# Patient Record
Sex: Male | Born: 1975
Health system: Southern US, Community
[De-identification: ages and names within clinical notes are randomized; demographics above are authoritative.]

## PROBLEM LIST (undated history)

## (undated) DIAGNOSIS — E66811 Obesity, class 1: Secondary | ICD-10-CM

## (undated) DIAGNOSIS — J189 Pneumonia, unspecified organism: Secondary | ICD-10-CM

## (undated) DIAGNOSIS — M79602 Pain in left arm: Secondary | ICD-10-CM

## (undated) DIAGNOSIS — F419 Anxiety disorder, unspecified: Secondary | ICD-10-CM

## (undated) DIAGNOSIS — T4145XA Adverse effect of unspecified anesthetic, initial encounter: Secondary | ICD-10-CM

## (undated) DIAGNOSIS — M4722 Other spondylosis with radiculopathy, cervical region: Secondary | ICD-10-CM

## (undated) DIAGNOSIS — T8859XA Other complications of anesthesia, initial encounter: Secondary | ICD-10-CM

## (undated) DIAGNOSIS — E669 Obesity, unspecified: Secondary | ICD-10-CM

## (undated) DIAGNOSIS — I809 Phlebitis and thrombophlebitis of unspecified site: Secondary | ICD-10-CM

## (undated) DIAGNOSIS — Z9889 Other specified postprocedural states: Secondary | ICD-10-CM

## (undated) DIAGNOSIS — F4310 Post-traumatic stress disorder, unspecified: Secondary | ICD-10-CM

## (undated) DIAGNOSIS — Z8489 Family history of other specified conditions: Secondary | ICD-10-CM

## (undated) HISTORY — DX: Post-traumatic stress disorder, unspecified: F43.10

## (undated) HISTORY — DX: Anxiety disorder, unspecified: F41.9

## (undated) HISTORY — DX: Other specified postprocedural states: Z98.890

## (undated) HISTORY — DX: Pain in left arm: M79.602

## (undated) HISTORY — PX: NO PAST SURGERIES: SHX2092

---

## 1999-02-19 ENCOUNTER — Emergency Department (HOSPITAL_COMMUNITY): Admission: EM | Admit: 1999-02-19 | Discharge: 1999-02-19 | Payer: Self-pay | Admitting: Emergency Medicine

## 1999-02-19 ENCOUNTER — Encounter: Payer: Self-pay | Admitting: Emergency Medicine

## 2000-09-01 ENCOUNTER — Encounter: Payer: Self-pay | Admitting: Emergency Medicine

## 2000-09-01 ENCOUNTER — Emergency Department (HOSPITAL_COMMUNITY): Admission: EM | Admit: 2000-09-01 | Discharge: 2000-09-01 | Payer: Self-pay | Admitting: Emergency Medicine

## 2004-12-02 ENCOUNTER — Emergency Department (HOSPITAL_COMMUNITY): Admission: EM | Admit: 2004-12-02 | Discharge: 2004-12-02 | Payer: Self-pay | Admitting: Emergency Medicine

## 2017-01-22 ENCOUNTER — Emergency Department (HOSPITAL_COMMUNITY)
Admission: EM | Admit: 2017-01-22 | Discharge: 2017-01-22 | Disposition: A | Discharge status: Home / Self Care | Payer: Self-pay | Attending: Emergency Medicine | Admitting: Emergency Medicine

## 2017-01-22 ENCOUNTER — Encounter (HOSPITAL_COMMUNITY): Payer: Self-pay | Admitting: Emergency Medicine

## 2017-01-22 ENCOUNTER — Emergency Department (HOSPITAL_COMMUNITY): Payer: Self-pay

## 2017-01-22 DIAGNOSIS — Y999 Unspecified external cause status: Secondary | ICD-10-CM | POA: Insufficient documentation

## 2017-01-22 DIAGNOSIS — Z23 Encounter for immunization: Secondary | ICD-10-CM | POA: Insufficient documentation

## 2017-01-22 DIAGNOSIS — T07XXXA Unspecified multiple injuries, initial encounter: Secondary | ICD-10-CM

## 2017-01-22 DIAGNOSIS — Y929 Unspecified place or not applicable: Secondary | ICD-10-CM | POA: Insufficient documentation

## 2017-01-22 DIAGNOSIS — F10920 Alcohol use, unspecified with intoxication, uncomplicated: Secondary | ICD-10-CM

## 2017-01-22 DIAGNOSIS — S0990XA Unspecified injury of head, initial encounter: Secondary | ICD-10-CM | POA: Insufficient documentation

## 2017-01-22 DIAGNOSIS — S41112A Laceration without foreign body of left upper arm, initial encounter: Secondary | ICD-10-CM | POA: Insufficient documentation

## 2017-01-22 DIAGNOSIS — Y939 Activity, unspecified: Secondary | ICD-10-CM | POA: Insufficient documentation

## 2017-01-22 DIAGNOSIS — R791 Abnormal coagulation profile: Secondary | ICD-10-CM | POA: Insufficient documentation

## 2017-01-22 LAB — CBC WITH DIFFERENTIAL/PLATELET
BASOS PCT: 1 %
Basophils Absolute: 0.1 10*3/uL (ref 0.0–0.1)
Eosinophils Absolute: 0.1 10*3/uL (ref 0.0–0.7)
Eosinophils Relative: 1 %
HCT: 45.2 % (ref 39.0–52.0)
HEMOGLOBIN: 15 g/dL (ref 13.0–17.0)
LYMPHS PCT: 23 %
Lymphs Abs: 2.4 10*3/uL (ref 0.7–4.0)
MCH: 29.5 pg (ref 26.0–34.0)
MCHC: 33.2 g/dL (ref 30.0–36.0)
MCV: 88.8 fL (ref 78.0–100.0)
MONO ABS: 0.7 10*3/uL (ref 0.1–1.0)
MONOS PCT: 6 %
NEUTROS ABS: 7.4 10*3/uL (ref 1.7–7.7)
NEUTROS PCT: 69 %
Platelets: 326 10*3/uL (ref 150–400)
RBC: 5.09 MIL/uL (ref 4.22–5.81)
RDW: 13 % (ref 11.5–15.5)
WBC: 10.6 10*3/uL — ABNORMAL HIGH (ref 4.0–10.5)

## 2017-01-22 LAB — COMPREHENSIVE METABOLIC PANEL
ALBUMIN: 4.5 g/dL (ref 3.5–5.0)
ALK PHOS: 41 U/L (ref 38–126)
ALT: 25 U/L (ref 17–63)
ANION GAP: 9 (ref 5–15)
AST: 22 U/L (ref 15–41)
BILIRUBIN TOTAL: 0.5 mg/dL (ref 0.3–1.2)
BUN: 8 mg/dL (ref 6–20)
CALCIUM: 9.2 mg/dL (ref 8.9–10.3)
CO2: 28 mmol/L (ref 22–32)
Chloride: 104 mmol/L (ref 101–111)
Creatinine, Ser: 1.18 mg/dL (ref 0.61–1.24)
GFR calc Af Amer: >60 mL/min (ref >60)
GLUCOSE: 122 mg/dL — AB (ref 65–99)
POTASSIUM: 3.7 mmol/L (ref 3.5–5.1)
Sodium: 141 mmol/L (ref 135–145)
TOTAL PROTEIN: 8.3 g/dL — AB (ref 6.5–8.1)

## 2017-01-22 LAB — PROTIME-INR
INR: 1.05
Prothrombin Time: 13.7 seconds (ref 11.4–15.2)

## 2017-01-22 LAB — I-STAT TROPONIN, ED: TROPONIN I, POC: 0 ng/mL (ref 0.00–0.08)

## 2017-01-22 LAB — CBG MONITORING, ED: Glucose-Capillary: 104 mg/dL — ABNORMAL HIGH (ref 65–99)

## 2017-01-22 LAB — APTT: aPTT: 28 seconds (ref 24–36)

## 2017-01-22 LAB — ETHANOL: Alcohol, Ethyl (B): 203 mg/dL — ABNORMAL HIGH (ref <5)

## 2017-01-22 MED ORDER — FLUORESCEIN SODIUM 0.6 MG OP STRP
1.0000 | ORAL_STRIP | Freq: Once | OPHTHALMIC | Status: DC
Start: 2017-01-22 — End: 2017-01-22

## 2017-01-22 MED ORDER — TETANUS-DIPHTH-ACELL PERTUSSIS 5-2.5-18.5 LF-MCG/0.5 IM SUSP
0.5000 mL | Freq: Once | INTRAMUSCULAR | Status: AC
  Administered 2017-01-22: 0.5 mL via INTRAMUSCULAR
  Filled 2017-01-22: qty 0.5

## 2017-01-22 MED ORDER — LIDOCAINE-EPINEPHRINE (PF) 2 %-1:200000 IJ SOLN
20.0000 mL | Freq: Once | INTRAMUSCULAR | Status: DC
  Filled 2017-01-22: qty 20

## 2017-01-22 MED ORDER — SODIUM CHLORIDE 0.9 % IV BOLUS (SEPSIS)
1000.0000 mL | Freq: Once | INTRAVENOUS | Status: AC
Start: 2017-01-22 — End: 2017-01-22
  Administered 2017-01-22: 1000 mL via INTRAVENOUS

## 2017-01-22 MED ORDER — LIDOCAINE-EPINEPHRINE (PF) 2 %-1:200000 IJ SOLN
20.0000 mL | Freq: Once | INTRAMUSCULAR | Status: AC
  Administered 2017-01-22: 20 mL via INTRADERMAL
  Filled 2017-01-22: qty 20

## 2017-01-22 MED ORDER — TETRACAINE HCL 0.5 % OP SOLN: 2.0000 [drp] | Freq: Once | OPHTHALMIC | Status: DC
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
## 2017-01-22 NOTE — ED Notes (Signed)
Patient transported to X-ray 
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
## 2017-01-22 NOTE — Discharge Instructions (Signed)
You may alternate Tylenol 1000 mg every 6 hours as needed for pain and Ibuprofen 800 mg every 8 hours as needed for pain.  Please take Ibuprofen with food. ° ° ° °To find a primary care or specialty doctor please call [phone] or [phone] to access "[facility] Find a Doctor Service." ° °You may also go on the [facility] website at [email or web address] ° °There are also multiple [facility], [facility],  and [facility] practices throughout [place] that are frequently accepting new patients. You may find a clinic that is close to your home and contact them. ° °[facility] -  °[address] °[place] [address] °[phone] ° ° °[facility] -  °[address] ° [place] [address] °[phone] ° ° °[facility] - °[address]  °[place] [address] °[phone] ° ° °

## 2017-01-24 ENCOUNTER — Encounter (HOSPITAL_COMMUNITY): Payer: Self-pay | Admitting: Emergency Medicine

## 2017-01-24 ENCOUNTER — Emergency Department (HOSPITAL_COMMUNITY)
Admission: EM | Admit: 2017-01-24 | Discharge: 2017-01-24 | Disposition: A | Discharge status: Home / Self Care | Payer: Self-pay | Attending: Physician Assistant | Admitting: Physician Assistant

## 2017-01-24 DIAGNOSIS — R200 Anesthesia of skin: Secondary | ICD-10-CM | POA: Insufficient documentation

## 2017-01-24 DIAGNOSIS — F17200 Nicotine dependence, unspecified, uncomplicated: Secondary | ICD-10-CM | POA: Insufficient documentation

## 2017-01-24 MED ORDER — HYDROCODONE-ACETAMINOPHEN 5-325 MG PO TABS
1.0000 | ORAL_TABLET | Freq: Once | ORAL | Status: AC
  Administered 2017-01-24: 1 via ORAL
  Filled 2017-01-24: qty 1

## 2017-01-24 NOTE — ED Notes (Signed)
Dressing applied to left hand 
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
## 2017-01-24 NOTE — Discharge Instructions (Signed)
Call the hand surgeon listed to schedule follow up if you decide to purse nerve repair.  Return to [facility] or ER on 4/30 for suture removal. Continue keeping wounds clean and dry.  Return earlier if wounds look like they are getting infected (red, draining pus, all the sudden pain gets way worse) or if you have any additional concerns.
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
## 2017-01-29 ENCOUNTER — Encounter (HOSPITAL_COMMUNITY): Payer: Self-pay

## 2017-01-29 ENCOUNTER — Emergency Department (HOSPITAL_COMMUNITY)
Admission: EM | Admit: 2017-01-29 | Discharge: 2017-01-29 | Disposition: A | Discharge status: Home / Self Care | Payer: Self-pay | Attending: Emergency Medicine | Admitting: Emergency Medicine

## 2017-01-29 DIAGNOSIS — F17200 Nicotine dependence, unspecified, uncomplicated: Secondary | ICD-10-CM | POA: Insufficient documentation

## 2017-01-29 DIAGNOSIS — L03012 Cellulitis of left finger: Secondary | ICD-10-CM | POA: Insufficient documentation

## 2017-01-29 DIAGNOSIS — Z4802 Encounter for removal of sutures: Secondary | ICD-10-CM | POA: Insufficient documentation

## 2017-01-29 MED ORDER — CEPHALEXIN 500 MG PO CAPS: 500.0000 mg | ORAL_CAPSULE | Freq: Four times a day (QID) | ORAL | 0 refills | Status: DC

## 2017-01-29 NOTE — Discharge Instructions (Signed)
The remaining sutures in your finger may be removed in 2 days.  Please take antibiotic as directed.  Follow-up with hand surgery as discussed (Dr. Melvyn Novas is the designated provider for this visit).

## 2017-01-29 NOTE — ED Notes (Signed)
Removed sutures from left FA and left palm. Removed every other suture from left pinky.
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
## 2017-01-29 NOTE — ED Triage Notes (Signed)
Per Pt, Pt is coming from home and need of suture removal.

## 2017-01-29 NOTE — ED Notes (Signed)
Suture removal kit at bedside 

## 2017-02-06 ENCOUNTER — Emergency Department (HOSPITAL_COMMUNITY)
Admission: EM | Admit: 2017-02-06 | Discharge: 2017-02-06 | Disposition: A | Discharge status: Home / Self Care | Payer: Self-pay | Attending: Emergency Medicine | Admitting: Emergency Medicine

## 2017-02-06 ENCOUNTER — Encounter (HOSPITAL_COMMUNITY): Payer: Self-pay | Admitting: *Deleted

## 2017-02-06 DIAGNOSIS — H1031 Unspecified acute conjunctivitis, right eye: Secondary | ICD-10-CM | POA: Insufficient documentation

## 2017-02-06 DIAGNOSIS — F17290 Nicotine dependence, other tobacco product, uncomplicated: Secondary | ICD-10-CM | POA: Insufficient documentation

## 2017-02-06 MED ORDER — TETRACAINE HCL 0.5 % OP SOLN
1.0000 [drp] | Freq: Once | OPHTHALMIC | Status: AC
  Administered 2017-02-06: 1 [drp] via OPHTHALMIC
  Filled 2017-02-06: qty 2

## 2017-02-06 MED ORDER — KETOROLAC TROMETHAMINE 0.5 % OP SOLN: 1.0000 [drp] | Freq: Four times a day (QID) | OPHTHALMIC | 0 refills | Status: DC

## 2017-02-06 MED ORDER — CIPROFLOXACIN HCL 0.3 % OP SOLN: 1.0000 [drp] | OPHTHALMIC | 0 refills | Status: DC

## 2017-02-06 MED ORDER — FLUORESCEIN SODIUM 0.6 MG OP STRP
1.0000 | ORAL_STRIP | Freq: Once | OPHTHALMIC | Status: AC
  Administered 2017-02-06: 1 via OPHTHALMIC
  Filled 2017-02-06: qty 1

## 2017-02-06 NOTE — Discharge Instructions (Signed)
Take the prescribed medication as directed.  Do not wear contacts until your eye heals.  When ready to wear contacts again, open new pair.  Throw old pair out. Follow-up with Dr. Sherryll BurgerShah if symptoms worsening or not improving in the next 48 hours. Return to the ED for new concerns.

## 2017-02-06 NOTE — ED Notes (Signed)
Pt requested [place]. Made pt aware that EDP/PA needs to see him first and then we'll go from there.

## 2017-02-06 NOTE — ED Notes (Signed)
Pt did not need anything at this time  

## 2017-02-06 NOTE — ED Notes (Signed)
[place]s lamp placed in room 

## 2017-02-06 NOTE — ED Provider Notes (Signed)
[facility] Provider Note   CSN: [id number] Arrival date & time: 02/06/17  [id number]     History   Chief Complaint Chief Complaint  Patient presents with  . Foreign Body in Eye    HPI Vincent Davis is a 41 y.o. male.  The history is provided by the patient and medical records.    41 year old male here with right eye redness and irritation.  States he awoke with the symptoms on Sunday morning after he accidentally slept in his contacts Saturday night. States his contact was still in his eye and he was able to remove it, but continued to feel that he has something stuck in his eye.  States his vision feels overall normal. States he has been using over-the-counter pinkeye drops without any change in symptoms. States he has not worn his contacts since Sunday morning.  History reviewed. No pertinent past medical history.  There are no active problems to display for this patient.   History reviewed. No pertinent surgical history.     Home Medications    Prior to Admission medications   Medication Sig Start Date End Date Taking? Authorizing Provider  cephALEXin (KEFLEX) 500 MG capsule Take 1 capsule (500 mg total) by mouth 4 (four) times daily. 01/29/17   Felicie MornSmith, David, NP    Family History No family history on file.  Social History Social History  Substance Use Topics  . Smoking status: Light Tobacco Smoker  . Smokeless tobacco: Never Used  . Alcohol use Yes     Allergies   Patient has no known allergies.   Review of Systems Review of Systems  Eyes: Positive for pain and redness.  All other systems reviewed and are negative.    Physical Exam Updated Vital Signs BP (!) 132/95 (BP Location: Left Arm)   Pulse 67   Temp 98.4 F (36.9 C) (Oral)   Resp 18   Ht 5\' 11"  (1.803 m)   Wt 104.3 kg   SpO2 93%   BMI 32.08 kg/m   Physical Exam  Constitutional: He is oriented to person, place, and time. He appears well-developed and well-nourished.  HENT:    Head: Normocephalic and atraumatic.  Mouth/Throat: Oropharynx is clear and moist.  Eyes: EOM are normal. Pupils are equal, round, and reactive to light. Right conjunctiva is injected.  No significant lid edema or erythema, right conjunctiva is injected and eyes tearing, there is no purulent drainage, EOMs fully intact, no nystagmus, pupils symmetric and reactive bilaterally, fluorescein stain without evidence or corneal ulcer or abrasion, no uptake; no foreign body noted  Neck: Normal range of motion.  Cardiovascular: Normal rate, regular rhythm and normal heart sounds.   Pulmonary/Chest: Effort normal and breath sounds normal.  Abdominal: Soft. Bowel sounds are normal.  Musculoskeletal: Normal range of motion.  Neurological: He is alert and oriented to person, place, and time.  Skin: Skin is warm and dry.  Psychiatric: He has a normal mood and affect.  Nursing note and vitals reviewed.    ED Treatments / Results  Labs (all labs ordered are listed, but only abnormal results are displayed) Labs Reviewed - No data to display  EKG  EKG Interpretation None       Radiology No results found.  Procedures Procedures (including critical care time)  Medications Ordered in ED Medications  tetracaine (PONTOCAINE) 0.5 % ophthalmic solution 1 drop (not administered)  fluorescein ophthalmic strip 1 strip (not administered)     Initial Impression / Assessment and Plan / ED  Course  I have reviewed the triage vital signs and the nursing notes.  Pertinent labs & imaging results that were available during my care of the patient were reviewed by me and considered in my medical decision making (see chart for details).  41 year old male here with right eye redness and sensation of foreign body after sleeping in his contacts over the weekend. He has no diffuse lid edema or erythema suggestive of orbital or preseptal cellulitis. His right conjunctiva is injected and eyes tearing. His EOMs are  fully intact.  Fluorescein stain performed, no evidence of corneal ulcer or abrasion. No uptake. No evidence of retained foreign body. Given patient wears contacts, will start Ciloxan drops for coverage of Pseudomonas. He is instructed not to wear his contacts until his eye heals, and then he should open a new pair and throw out old pair. He does not have a eye doctor here locally, so was given information for ophthalmologist on call.  Discussed plan with patient, he acknowledged understanding and agreed with plan of care.  Return precautions given for new or worsening symptoms.  Final Clinical Impressions(s) / ED Diagnoses   Final diagnoses:  Acute conjunctivitis of right eye, unspecified acute conjunctivitis type    New Prescriptions Discharge Medication List as of 02/06/2017  7:55 AM    START taking these medications   Details  ciprofloxacin (CILOXAN) 0.3 % ophthalmic solution Place 1 drop into the right eye every 2 (two) hours. Administer 1 drop, every 2 hours, while awake, for 2 days. Then 1 drop, every 4 hours, while awake, for the next 5 days., Starting Tue 02/06/2017, Print    ketorolac (ACULAR) 0.5 % ophthalmic solution Place 1 drop into both eyes every 6 (six) hours., Starting Tue 02/06/2017, Print         Garlon Hatchet, PA-C 02/06/17 1610    Glynn Octave, MD 02/06/17 [phone]

## 2017-02-06 NOTE — ED Triage Notes (Signed)
c/o pain in right eye onset Sunday states he got some otc eye medication however it isn't working. Right eye swollen denies injury

## 2017-02-09 MED FILL — CIPROFLOXACIN 0.3% EYE DROP: 0.3 | 10 days supply | Qty: 5 | Fill #0

## 2017-02-09 MED FILL — KETOROLAC 0.5% OPHTH SOLN: 0.5 | 11 days supply | Qty: 3 | Fill #0

## 2017-04-21 ENCOUNTER — Encounter (HOSPITAL_COMMUNITY): Payer: Self-pay | Admitting: Emergency Medicine

## 2017-04-21 ENCOUNTER — Emergency Department (HOSPITAL_COMMUNITY)
Admission: EM | Admit: 2017-04-21 | Discharge: 2017-04-21 | Disposition: A | Discharge status: Home / Self Care | Payer: Self-pay | Attending: Emergency Medicine | Admitting: Emergency Medicine

## 2017-04-21 DIAGNOSIS — G8929 Other chronic pain: Secondary | ICD-10-CM | POA: Insufficient documentation

## 2017-04-21 DIAGNOSIS — F17210 Nicotine dependence, cigarettes, uncomplicated: Secondary | ICD-10-CM | POA: Insufficient documentation

## 2017-04-21 DIAGNOSIS — M25512 Pain in left shoulder: Secondary | ICD-10-CM | POA: Insufficient documentation

## 2017-04-21 DIAGNOSIS — M79602 Pain in left arm: Secondary | ICD-10-CM | POA: Insufficient documentation

## 2017-04-21 MED ORDER — ACETAMINOPHEN 500 MG PO TABS: 500.0000 mg | ORAL_TABLET | Freq: Four times a day (QID) | ORAL | 0 refills | Status: DC | PRN

## 2017-04-21 MED ORDER — METHOCARBAMOL 500 MG PO TABS: 500.0000 mg | ORAL_TABLET | Freq: Two times a day (BID) | ORAL | 0 refills | Status: DC

## 2017-04-21 MED ORDER — NAPROXEN 500 MG PO TABS
500.0000 mg | ORAL_TABLET | Freq: Once | ORAL | Status: AC
  Administered 2017-04-21: 500 mg via ORAL
  Filled 2017-04-21: qty 1

## 2017-04-21 MED ORDER — NAPROXEN 500 MG PO TABS: 500.0000 mg | ORAL_TABLET | Freq: Two times a day (BID) | ORAL | 0 refills | Status: DC

## 2017-04-21 NOTE — Discharge Instructions (Addendum)
Medications: Robaxin, Naprosyn, Tylenol  Treatment: Take Robaxin twice daily as prescribed. Do not drive or operate machinery when taking this medication. Take Naprosyn twice daily as prescribed. You can take Tylenol throughout the day every 6 hours as well. Use a heating pad to shoulder 3-4 times daily alternating 20 minutes on, 20 minutes off. Attempt the stretches we discussed daily after heating. You may find a massage to be helpful for you.  Follow-up: Please follow-up with the orthopedic doctor if your symptoms are not improving over the next week. Please return to the emergency department if you develop any new or worsening symptoms.

## 2017-04-21 NOTE — ED Provider Notes (Signed)
[facility]-EMERGENCY DEPT Provider Note   CSN: [id number] Arrival date & time: 04/21/17  [id number]   By signing my name below, I, Vincent Davis, attest that this documentation has been prepared under the direction and in the presence of Vincent BayleyAlex Zyair Rhein PA-C. Electronically Signed: Thelma BargeNick Davis, Scribe. 04/21/17. 10:30 AM.  History   Chief Complaint Chief Complaint  Patient presents with  . Arm Pain   [name]    HPI Comments: Vincent Davis is a 41 y.o. male who presents to the Emergency Department complaining of waxing/waning, gradually worsening left-sided arm pain that began 3 months ago. He has associated neck pain and numbness/tingling to the area. He was seen in the ED previously for this where he received a laceration repair and the pain has occurred intermittently since. He describes this as "inside my arm there is pain that shoots from my shoulder to my forearm". The pain is occasionally worse when he moves his arm. He has tried massage therapy with mild to no relief. He denies fever, CP, and SOB. He denies any new mechanism of injury or trauma to the area. Pt has not seen an orthopedist for his injury. Pt has no pertinent medical history. 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 Pt is R hand dominant.  History reviewed. No pertinent past medical history.  There are no active problems to display for this patient.   History reviewed. No pertinent surgical history.     Home Medications    Prior to Admission medications   Medication Sig Start Date End Date Taking? Authorizing Provider  acetaminophen (TYLENOL) 500 MG tablet Take 1 tablet (500 mg total) by mouth every 6 (six) hours as needed. 04/21/17   Vincent Davis, Vincent BogaAlexandra M, PA-C  cephALEXin (KEFLEX) 500 MG capsule Take 1 capsule (500 mg total) by mouth 4 (four) times daily. 01/29/17   Vincent Davis, David, NP  ciprofloxacin (CILOXAN) 0.3 % ophthalmic  solution Place 1 drop into the right eye every 2 (two) hours. Administer 1 drop, every 2 hours, while awake, for 2 days. Then 1 drop, every 4 hours, while awake, for the next 5 days. 02/06/17   Vincent Davis, Vincent M, PA-C  ketorolac (ACULAR) 0.5 % ophthalmic solution Place 1 drop into both eyes every 6 (six) hours. 02/06/17   Vincent Davis, Vincent M, PA-C  methocarbamol (ROBAXIN) 500 MG tablet Take 1 tablet (500 mg total) by mouth 2 (two) times daily. 04/21/17   Vincent Davis, Vincent BogaAlexandra M, PA-C  naproxen (NAPROSYN) 500 MG tablet Take 1 tablet (500 mg total) by mouth 2 (two) times daily. 04/21/17   Vincent Davis, Vincent Davis M, PA-C    Family History No family history on file.  Social History Social History  Substance Use Topics  . Smoking status: Light Tobacco Smoker  . Smokeless tobacco: Never Used  . Alcohol use Yes     Allergies   Patient has no known allergies.   Review of Systems Review of Systems  Constitutional: Negative for fever.  Respiratory: Negative for shortness of breath.   Cardiovascular: Negative for chest pain.  Musculoskeletal: Positive for myalgias and neck pain.  Neurological: Positive for numbness.     Physical Exam Updated Vital Signs BP (!) 164/77 (BP Location: Right Arm)   Pulse 77   Temp 98.9 F (37.2 C) (Oral)   Resp 16   Ht 5\' 11"  (1.803 Davis)   Wt 104.3 kg (230 lb)  SpO2 100%   BMI 32.08 kg/Davis   Physical Exam  Constitutional: He appears well-developed and well-nourished. No distress.  HENT:  Head: Normocephalic and atraumatic.  Mouth/Throat: Oropharynx is clear and moist. No oropharyngeal exudate.  Eyes: Pupils are equal, round, and reactive to light. Conjunctivae are normal. Right eye exhibits no discharge. Left eye exhibits no discharge. No scleral icterus.  Neck: Normal range of motion. Neck supple. Muscular tenderness present. No spinous process tenderness present. Normal range of motion present. No thyromegaly present.    Cardiovascular: Normal rate, regular rhythm, normal  heart sounds and intact distal pulses.  Exam reveals no gallop and no friction rub.   No murmur heard. Pulmonary/Chest: Effort normal and breath sounds normal. No stridor. No respiratory distress. He has no wheezes. He has no rales.  Abdominal: Soft. Bowel sounds are normal. He exhibits no distension. There is no tenderness. There is no rebound and no guarding.  Musculoskeletal: He exhibits no edema.  Left upper trapezius tenderness and spasm on palpation with tenderness to lateral triceps to lateral proximal forearm Normal sensation throughout upper extremity except over scar of previous sutures on left 5th digit Full ROM, equal bilateral grip strength 5/5 strength with elbow flexion and extension but pain with these emotions Radial pulses intact Full ROM of all digits  Lymphadenopathy:    He has no cervical adenopathy.  Neurological: He is alert. Coordination normal.  Skin: Skin is warm and dry. No rash noted. He is not diaphoretic. No pallor.  Psychiatric: He has a normal mood and affect.  Nursing note and vitals reviewed.    ED Treatments / Results  DIAGNOSTIC STUDIES: Oxygen Saturation is 100% on RA3, normal by my interpretation.    COORDINATION OF CARE: 10:29 AM Discussed treatment plan with pt at bedside and pt agreed to plan.  Labs (all labs ordered are listed, but only abnormal results are displayed) Labs Reviewed - No data to display  EKG  EKG Interpretation None       Radiology No results found.  Procedures Procedures (including critical care time)  Medications Ordered in ED Medications  naproxen (NAPROSYN) tablet 500 mg (500 mg Oral Given 04/21/17 1048)     Initial Impression / Assessment and Plan / ED Course  I have reviewed the triage vital signs and the nursing notes.  Pertinent labs & imaging results that were available during my care of the patient were reviewed by me and considered in my medical decision making (see chart for details).       Pt presents to the ER with neck pain that is radiating down arm. There has been no recent trauma and imaging is not indicated at this time. Patient has no chest pain or shortness of breath and pain is very consistent with radicular pain versus ACS. We'll discharge patient home with supportive treatment including Robaxin, Naprosyn, Tylenol. No evidence of cervical nerve root compression on physical exam. DC w conservative home therapies (ie heat) and stretching. I also advised further massage and stress relieving activities. Advised f-u with orthopedics if s/s persist. Return precautions discussed. Patient understands and agrees with plan. Patient vitals stable throughout ED course and discharged in satisfactory condition.   Final Clinical Impressions(s) / ED Diagnoses   Final diagnoses:  Left arm pain  Acute pain of left shoulder    New Prescriptions New Prescriptions   ACETAMINOPHEN (TYLENOL) 500 MG TABLET    Take 1 tablet (500 mg total) by mouth every 6 (six) hours as needed.  METHOCARBAMOL (ROBAXIN) 500 MG TABLET    Take 1 tablet (500 mg total) by mouth 2 (two) times daily.   NAPROXEN (NAPROSYN) 500 MG TABLET    Take 1 tablet (500 mg total) by mouth 2 (two) times daily.  I personally performed the services described in this documentation, which was scribed in my presence. The recorded information has been reviewed and is accurate.    Vincent Holes, PA-C 04/21/17 1054    Alvira Monday, MD 04/21/17 2306

## 2017-04-21 NOTE — ED Notes (Addendum)
Pt from home with c/o left arm pain that initially began when he had stitches placed in May. Pt states he has had shooting pain since then. Pt states the pain is intermittent but got increasingly worse and woke him up last night. Pt rates his pain 7/10. Pt has full ROM

## 2017-04-21 NOTE — ED Notes (Signed)
Bed: WTR5 Expected date:  Expected time:  Means of arrival:  Comments: 

## 2017-04-21 NOTE — ED Notes (Signed)
PA at bedside.

## 2017-04-25 ENCOUNTER — Encounter (HOSPITAL_COMMUNITY): Payer: Self-pay | Admitting: Emergency Medicine

## 2017-04-25 ENCOUNTER — Emergency Department (HOSPITAL_COMMUNITY)
Admission: EM | Admit: 2017-04-25 | Discharge: 2017-04-25 | Disposition: A | Discharge status: Home / Self Care | Payer: Self-pay | Attending: Emergency Medicine | Admitting: Emergency Medicine

## 2017-04-25 DIAGNOSIS — M79602 Pain in left arm: Secondary | ICD-10-CM | POA: Insufficient documentation

## 2017-04-25 DIAGNOSIS — F17210 Nicotine dependence, cigarettes, uncomplicated: Secondary | ICD-10-CM | POA: Insufficient documentation

## 2017-04-25 DIAGNOSIS — Z79899 Other long term (current) drug therapy: Secondary | ICD-10-CM | POA: Insufficient documentation

## 2017-04-25 DIAGNOSIS — M25512 Pain in left shoulder: Secondary | ICD-10-CM | POA: Insufficient documentation

## 2017-04-25 MED ORDER — METHOCARBAMOL 500 MG PO TABS
500.0000 mg | ORAL_TABLET | Freq: Once | ORAL | Status: AC
  Administered 2017-04-25: 500 mg via ORAL
  Filled 2017-04-25: qty 1

## 2017-04-25 MED ORDER — CYCLOBENZAPRINE HCL 10 MG PO TABS: 10.0000 mg | ORAL_TABLET | Freq: Two times a day (BID) | ORAL | 0 refills | Status: DC | PRN

## 2017-04-25 NOTE — Care Management (Signed)
ED CM received [facility] secretary concerning patient needing assistance with follow up, patient is uninsured [facility] appt scheduled for 8/1 at 10am information placed on AVS [facility]. No further ED CM needs identified
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
## 2017-04-25 NOTE — Discharge Instructions (Signed)
Continue taking tylenol and naprosyn for pain.   Continue to apply heat to the affected area.   Take Flexeril as prescribed. This medication will make you drowsy so do not drive or drink alcohol when taking it. As we discussed, DO NOT TAKE the flexeril and the robaxin together.   Follow-up with the referred orthopedic doctor for further evaluation.   Follow-up with the [facility] listed on [facility] paperwork. They have arranged an appointment for you.  Return ot the Emergency Department for any worsening or concerning symptoms.

## 2017-04-25 NOTE — ED Provider Notes (Signed)
[facility] Provider [facility]   CSN: [id number] Arrival date & time: 04/25/17  1121     History   Chief Complaint Chief Complaint  Patient presents with  . arm numbness    left   . Shoulder Pain    left     HPI Vincent Davis is a 41 y.o. male who presents with continued left arm pain and numbness/tingling. Patient states that pain originated from a previous injury where he had a laceration repair. Patient reports a "pain inside my arm that goes down the whole arm."  He intermittently experiences tingling to the left hand. He reports that movement of the LUE exacerbates his pain. Patient was seen at [facility] ED on 04/21/17 for evaluation of same symptoms. At that time he was discharged with Robaxin, Naprosyn and Tylenol and instructed on conservative therapies, such warm compresses, massage. He was referred to orthopedics for fur evaluation. Patient comes to the ED today because he states that he has not been able to follow-up with the referred orthopedic doctor. He also is requesting refills on his medication and additional work note. Additionally, he states that he needs to talk to case management regarding his case. Patient denies any new or changes in symptoms. He denies any new trauma, fall, or injury. Patient denies any neck pain, CP, SOB.    The history is provided by the patient.    History reviewed. No pertinent past medical history.  There are no active problems to display for this patient.   History reviewed. No pertinent surgical history.     Home Medications    Prior to Admission medications   Medication Sig Start Date End Date Taking? Authorizing Provider  acetaminophen (TYLENOL) 500 MG tablet Take 1 tablet (500 mg total) by mouth every 6 (six) hours as needed. 04/21/17   Law, Waylan Boga, PA-C  cephALEXin (KEFLEX) 500 MG capsule Take 1 capsule (500 mg total) by mouth 4 (four) times daily. 01/29/17   Felicie Morn, NP  ciprofloxacin (CILOXAN) 0.3 %  ophthalmic solution Place 1 drop into the right eye every 2 (two) hours. Administer 1 drop, every 2 hours, while awake, for 2 days. Then 1 drop, every 4 hours, while awake, for the next 5 days. 02/06/17   Garlon Hatchet, PA-C  cyclobenzaprine (FLEXERIL) 10 MG tablet Take 1 tablet (10 mg total) by mouth 2 (two) times daily as needed for muscle spasms. 04/25/17   Maxwell Caul, PA-C  ketorolac (ACULAR) 0.5 % ophthalmic solution Place 1 drop into both eyes every 6 (six) hours. 02/06/17   Garlon Hatchet, PA-C  methocarbamol (ROBAXIN) 500 MG tablet Take 1 tablet (500 mg total) by mouth 2 (two) times daily. 04/21/17   Law, Waylan Boga, PA-C  naproxen (NAPROSYN) 500 MG tablet Take 1 tablet (500 mg total) by mouth 2 (two) times daily. 04/21/17   Emi Holes, PA-C    Family History History reviewed. No pertinent family history.  Social History Social History  Substance Use Topics  . Smoking status: Light Tobacco Smoker  . Smokeless tobacco: Never Used  . Alcohol use Yes     Allergies   Patient has no known allergies.   Review of Systems Review of Systems  Respiratory: Negative for shortness of breath.   Cardiovascular: Negative for chest pain.  Musculoskeletal: Negative for neck pain.       LUE pain  Neurological: Positive for numbness. Negative for weakness.     Physical Exam Updated Vital Signs  BP (!) 142/88 (BP Location: Right Arm)   Pulse 77   Temp 98.8 F (37.1 C) (Oral)   Resp 17   Ht 5\' 11"  (1.803 m)   Wt 104.3 kg (230 lb)   SpO2 98%   BMI 32.08 kg/m   Physical Exam  Constitutional: He is oriented to person, place, and time. He appears well-developed and well-nourished.  Sitting comfortably on examination table  HENT:  Head: Normocephalic and atraumatic.  Eyes: Conjunctivae and EOM are normal. Right eye exhibits no discharge. Left eye exhibits no discharge. No scleral icterus.  Neck: Full passive range of motion without pain.  Full flexion/extension and lateral  movement of neck fully intact. No bony midline tenderness. No deformities or crepitus. Positive Spurlings.  Cardiovascular:  Pulses:      Radial pulses are 2+ on the right side, and 2+ on the left side.  Pulmonary/Chest: Effort normal.  Musculoskeletal:  Limited ROM of left shoulder secondary to patietn's pain. No deformity or crepitus noted. No overlying warmth, erythema, edema, ecchymosis. FROM of RUE.   Neurological: He is alert and oriented to person, place, and time. GCS eye subscore is 4. GCS verbal subscore is 5. GCS motor subscore is 6.  Follows commands, Moves all extremities  5/5 strength to BUE and BLE  Sensation intact throughout   Skin: Skin is warm and dry.  Psychiatric: He has a normal mood and affect. His speech is normal and behavior is normal.  Nursing note and vitals reviewed.    ED Treatments / Results  Labs (all labs ordered are listed, but only abnormal results are displayed) Labs Reviewed - No data to display  EKG  EKG Interpretation None       Radiology No results found.  Procedures Procedures (including critical care time)  Medications Ordered in ED Medications  methocarbamol (ROBAXIN) tablet 500 mg (500 mg Oral Given 04/25/17 1344)     Initial Impression / Assessment and Plan / ED Course  I have reviewed the triage vital signs and the nursing notes.  Pertinent labs & imaging results that were available during my care of the patient were reviewed by me and considered in my medical decision making (see chart for details).     41 y.o. M who presents with persistent LUE pain. Seen here on 04/21/17 for same symptoms. Patient is afebrile, non-toxic appearing, sitting comfortably on examination table. Patient is neurovascularly intact. Physical exam is not concerning for septic arthritis. Consider exacerbation of chronic pain vs muscle strain vs radiculopathy.  Patient requesting to speak to case management regarding his case. Patient requesting more  pain medication since the medicine he was prescribed only temporarily relieves his pain. Explained that he needs to continue trying conservative medications and that narcotics are not indicated in this instance. Patient requesting that I write him a work note until he can be seen by orthopedics. He does not have a a scheduled appointment yet. Given that patient has no new trauma or injury and no changes in symptoms, no further workup required at this time. Will plan to refer to case management.   Paged case management. Updated patient on plan. He is very upset that case management was not immediately paged when he came to the ED. He reports "I told them out front when I first came in that I needed to see case management so why weren't they called then."  Patient is very upset that he has to wait for them to "maybe call back." Apologized to  patient and rxplained to patient that he needs to be seen by a provider before case management is consulted and that we are following protocol. Patient asking for pain medication, state he hasn't taken anything since last night. Plan to give analgesics in the department.   Case management has arranged for an outpatient primary care appointment for 05/02/17. Discussed with patient and instructed him to follow-up as directed on the discharge paperwork. Plan to provide patient with a work note for the next few days. Instructed patient to continue with conservative therapy measures. Will continue muscle relaxer. Return precautions discussed. Patient expresses understanding and agreement to plan.    Final Clinical Impressions(s) / ED Diagnoses   Final diagnoses:  Left arm pain  Acute pain of left shoulder    New Prescriptions Discharge Medication List as of 04/25/2017  2:35 PM    START taking these medications   Details  cyclobenzaprine (FLEXERIL) 10 MG tablet Take 1 tablet (10 mg total) by mouth 2 (two) times daily as needed for muscle spasms., Starting Wed 04/25/2017,  Print         Maxwell CaulLayden, Lindsey A, PA-C 04/29/17 1357    Little, Ambrose Finlandachel Morgan, MD 05/01/17 [phone]

## 2017-05-02 ENCOUNTER — Ambulatory Visit: Payer: Self-pay | Admitting: Family Medicine

## 2017-05-03 ENCOUNTER — Ambulatory Visit (INDEPENDENT_AMBULATORY_CARE_PROVIDER_SITE_OTHER): Payer: Self-pay | Admitting: Family Medicine

## 2017-05-03 ENCOUNTER — Encounter: Payer: Self-pay | Admitting: Family Medicine

## 2017-05-03 VITALS — BP 140/88 | HR 92 | Temp 98.0°F | Resp 16 | Ht 71.0 in | Wt 224.0 lb

## 2017-05-03 DIAGNOSIS — M79602 Pain in left arm: Secondary | ICD-10-CM

## 2017-05-03 DIAGNOSIS — Z789 Other specified health status: Secondary | ICD-10-CM

## 2017-05-03 DIAGNOSIS — R030 Elevated blood-pressure reading, without diagnosis of hypertension: Secondary | ICD-10-CM

## 2017-05-03 DIAGNOSIS — Z7289 Other problems related to lifestyle: Secondary | ICD-10-CM

## 2017-05-03 DIAGNOSIS — F1010 Alcohol abuse, uncomplicated: Secondary | ICD-10-CM

## 2017-05-03 DIAGNOSIS — Z131 Encounter for screening for diabetes mellitus: Secondary | ICD-10-CM

## 2017-05-03 LAB — POCT GLYCOSYLATED HEMOGLOBIN (HGB A1C): Hemoglobin A1C: 5.1

## 2017-05-03 MED ORDER — GABAPENTIN 300 MG PO CAPS: 300.0000 mg | ORAL_CAPSULE | Freq: Three times a day (TID) | ORAL | 1 refills | Status: DC | PRN

## 2017-05-03 MED FILL — GABAPENTIN 300 MG CAPSULE: 300 | 30 days supply | Qty: 90 | Fill #0

## 2017-05-03 NOTE — Progress Notes (Signed)
Patient ID: Vincent Davis, male    DOB: 09/09/1976, 41 y.o.   MRN: [id number]  PCP: Bing NeighborsHarris, Kaija Kovacevic S, FNP  Chief Complaint  Patient presents with  . Hospitalization Follow-up    Subjective:  HPI Vincent Davis is a 41 y.o. male that presents for a hospital follow-up. Vincent Davis recently presented to the emergency department twice in less than 30 days with a compliant of left arm pain. He characterizes pain as aching, shooting/sharp, and throbbing. He reports that this pain has been present since he suffered from a laceration to his left arm that was subsequently repaired with sutures in April 2018. He reports decreased range of motion of his left arm with associated numbness, tingling, extending into his left wrist and hand.He has attempted relief with ibuprofen and muscle relaxants, without significant relief of symptoms. Vincent Davis has a history of alcohol abuse and admits to drinking heavier volumes of alcohol to cope with his shoulder pain. He was referred to orthopedic surgery for further evaluation, however, he is uninsured and hasn't had the financial means to follow-up. Social History   Social History  . Marital status: Single    Spouse name: N/A  . Number of children: N/A  . Years of education: N/A   Occupational History  . Not on file.   Social History Main Topics  . Smoking status: Light Tobacco Smoker  . Smokeless tobacco: Never Used  . Alcohol use Yes  . Drug use: No  . Sexual activity: Not on file   Other Topics Concern  . Not on file   Social History Narrative  . No narrative on file   Review of Systems See HPI No Known Allergies  Prior to Admission medications   Medication Sig Start Date End Date Taking? Authorizing Provider  acetaminophen (TYLENOL) 500 MG tablet Take 1 tablet (500 mg total) by mouth every 6 (six) hours as needed. 04/21/17  Yes Law, Waylan BogaAlexandra M, PA-C  cephALEXin (KEFLEX) 500 MG capsule Take 1 capsule (500 mg total) by mouth 4 (four) times  daily. Patient not taking: Reported on 05/03/2017 01/29/17   Felicie MornSmith, David, NP  ciprofloxacin (CILOXAN) 0.3 % ophthalmic solution Place 1 drop into the right eye every 2 (two) hours. Administer 1 drop, every 2 hours, while awake, for 2 days. Then 1 drop, every 4 hours, while awake, for the next 5 days. Patient not taking: Reported on 05/03/2017 02/06/17   Garlon HatchetSanders, Lisa M, PA-C  cyclobenzaprine (FLEXERIL) 10 MG tablet Take 1 tablet (10 mg total) by mouth 2 (two) times daily as needed for muscle spasms. Patient not taking: Reported on 05/03/2017 04/25/17   Graciella FreerLayden, Lindsey A, PA-C  ketorolac (ACULAR) 0.5 % ophthalmic solution Place 1 drop into both eyes every 6 (six) hours. Patient not taking: Reported on 05/03/2017 02/06/17   Garlon HatchetSanders, Lisa M, PA-C  methocarbamol (ROBAXIN) 500 MG tablet Take 1 tablet (500 mg total) by mouth 2 (two) times daily. Patient not taking: Reported on 05/03/2017 04/21/17   Emi HolesLaw, Alexandra M, PA-C  naproxen (NAPROSYN) 500 MG tablet Take 1 tablet (500 mg total) by mouth 2 (two) times daily. Patient not taking: Reported on 05/03/2017 04/21/17   Emi HolesLaw, Alexandra M, PA-C    Past Medical, Surgical Family and Social History reviewed and updated.  Objective:   Today's Vitals   05/03/17 0916  BP: (!) 146/90  Pulse: 92  Resp: 16  Temp: 98 F (36.7 C)  TempSrc: Oral  SpO2: 98%  Weight: 224 lb (101.6 kg)  Height: 5'  11" (1.803 m)    Wt Readings from Last 3 Encounters:  05/03/17 224 lb (101.6 kg)  04/25/17 230 lb (104.3 kg)  04/21/17 230 lb (104.3 kg)   Physical Exam  Constitutional: He is oriented to person, place, and time. He appears well-developed and well-nourished.  HENT:  Head: Normocephalic and atraumatic.  Eyes: Pupils are equal, round, and reactive to light. Conjunctivae are normal.  Neck: Normal range of motion. Neck supple.  Cardiovascular: Normal rate, regular rhythm, normal heart sounds and intact distal pulses.   Pulmonary/Chest: Effort normal and breath sounds normal.   Musculoskeletal:       Left shoulder: He exhibits decreased range of motion and pain. He exhibits no effusion, no crepitus and normal strength.       Left upper arm: He exhibits bony tenderness. He exhibits no swelling, no edema, no deformity and no laceration.  Limited ROM with internal and external rotation.  Increased tenderness with adduction and abduction.  Neurological: He is alert and oriented to person, place, and time.  Skin: Skin is warm.  Psychiatric: He has a normal mood and affect. His behavior is normal. Judgment and thought content normal.   Assessment & Plan:  1. Left arm pain, complains of mixed symptoms including nerve and arthralgia type pain. With history of heavy alcohol use will obtain a hemoglobin A1C to screen for diabetes. Pain originates in the shoulder and radiates down extending into hands. Concern for possible nerve impingement. - Ambulatory referral to Orthopedic Surgery -Will trial Gabapentin 300 mg 3 times daily as needed for pain  2. Screening for diabetes mellitus - POCT glycosylated hemoglobin (Hb A1C)  3. Alcohol abuse -Encouraged substance abuse counseling   4. Elevated blood pressure -Return in 4 weeks for blood pressure recheck  Patient was given [place] Patient Assistance Application   RTC: 4 Weeks blood pressure recheck and 3 months   Godfrey PickKimberly S. Tiburcio PeaHarris, MSN, FNP-C [facility]  [address] [place], [place] [address] [phone]

## 2017-05-03 NOTE — Patient Instructions (Addendum)
For shoulder and left arm pain, I am prescribing Gabapentin 300 mg, 3 times daily as needed.  Do not take medication with alcohol.  Your blood pressure is elevated today. Return in 4 weeks for Blood Pressure recheck.   Hypertension Hypertension, commonly called high blood pressure, is when the force of blood pumping through the arteries is too strong. The arteries are the blood vessels that carry blood from the heart throughout the body. Hypertension forces the heart to work harder to pump blood and may cause arteries to become narrow or stiff. Having untreated or uncontrolled hypertension can cause heart attacks, strokes, kidney disease, and other problems. A blood pressure reading consists of a higher number over a lower number. Ideally, your blood pressure should be below 120/80. The first ("top") number is called the systolic pressure. It is a measure of the pressure in your arteries as your heart beats. The second ("bottom") number is called the diastolic pressure. It is a measure of the pressure in your arteries as the heart relaxes. What are the causes? The cause of this condition is not known. What increases the risk? Some risk factors for high blood pressure are under your control. Others are not. Factors you can change  Smoking.  Having type 2 diabetes mellitus, high cholesterol, or both.  Not getting enough exercise or physical activity.  Being overweight.  Having too much fat, sugar, calories, or salt (sodium) in your diet.  Drinking too much alcohol. Factors that are difficult or impossible to change  Having chronic kidney disease.  Having a family history of high blood pressure.  Age. Risk increases with age.  Race. You may be at higher risk if you are African-American.  Gender. Men are at higher risk than women before age 10. After age 45, women are at higher risk than men.  Having obstructive sleep apnea.  Stress. What are the signs or symptoms? Extremely  high blood pressure (hypertensive crisis) may cause:  Headache.  Anxiety.  Shortness of breath.  Nosebleed.  Nausea and vomiting.  Severe chest pain.  Jerky movements you cannot control (seizures).  How is this diagnosed? This condition is diagnosed by measuring your blood pressure while you are seated, with your arm resting on a surface. The cuff of the blood pressure monitor will be placed directly against the skin of your upper arm at the level of your heart. It should be measured at least twice using the same arm. Certain conditions can cause a difference in blood pressure between your right and left arms. Certain factors can cause blood pressure readings to be lower or higher than normal (elevated) for a short period of time:  When your blood pressure is higher when you are in a health care provider's office than when you are at home, this is called white coat hypertension. Most people with this condition do not need medicines.  When your blood pressure is higher at home than when you are in a health care provider's office, this is called masked hypertension. Most people with this condition may need medicines to control blood pressure.  If you have a high blood pressure reading during one visit or you have normal blood pressure with other risk factors:  You may be asked to return on a different day to have your blood pressure checked again.  You may be asked to monitor your blood pressure at home for 1 week or longer.  If you are diagnosed with hypertension, you may have other blood or  imaging tests to help your health care provider understand your overall risk for other conditions. How is this treated? This condition is treated by making healthy lifestyle changes, such as eating healthy foods, exercising more, and reducing your alcohol intake. Your health care provider may prescribe medicine if lifestyle changes are not enough to get your blood pressure under control, and  if:  Your systolic blood pressure is above 130.  Your diastolic blood pressure is above 80.  Your personal target blood pressure may vary depending on your medical conditions, your age, and other factors. Follow these instructions at home: Eating and drinking  Eat a diet that is high in fiber and potassium, and low in sodium, added sugar, and fat. An example eating plan is called the DASH (Dietary Approaches to Stop Hypertension) diet. To eat this way: ? Eat plenty of fresh fruits and vegetables. Try to fill half of your plate at each meal with fruits and vegetables. ? Eat whole grains, such as whole wheat pasta, brown rice, or whole grain bread. Fill about one quarter of your plate with whole grains. ? Eat or drink low-fat dairy products, such as skim milk or low-fat yogurt. ? Avoid fatty cuts of meat, processed or cured meats, and poultry with skin. Fill about one quarter of your plate with lean proteins, such as fish, chicken without skin, beans, eggs, and tofu. ? Avoid premade and processed foods. These tend to be higher in sodium, added sugar, and fat.  Reduce your daily sodium intake. Most people with hypertension should eat less than 1,500 mg of sodium a day.  Limit alcohol intake to no more than 1 drink a day for nonpregnant women and 2 drinks a day for men. One drink equals 12 oz of beer, 5 oz of wine, or 1 oz of hard liquor. Lifestyle  Work with your health care provider to maintain a healthy body weight or to lose weight. Ask what an ideal weight is for you.  Get at least 30 minutes of exercise that causes your heart to beat faster (aerobic exercise) most days of the week. Activities may include walking, swimming, or biking.  Include exercise to strengthen your muscles (resistance exercise), such as pilates or lifting weights, as part of your weekly exercise routine. Try to do these types of exercises for 30 minutes at least 3 days a week.  Do not use any products that contain  nicotine or tobacco, such as cigarettes and e-cigarettes. If you need help quitting, ask your health care provider.  Monitor your blood pressure at home as told by your health care provider.  Keep all follow-up visits as told by your health care provider. This is important. Medicines  Take over-the-counter and prescription medicines only as told by your health care provider. Follow directions carefully. Blood pressure medicines must be taken as prescribed.  Do not skip doses of blood pressure medicine. Doing this puts you at risk for problems and can make the medicine less effective.  Ask your health care provider about side effects or reactions to medicines that you should watch for. Contact a health care provider if:  You think you are having a reaction to a medicine you are taking.  You have headaches that keep coming back (recurring).  You feel dizzy.  You have swelling in your ankles.  You have trouble with your vision. Get help right away if:  You develop a severe headache or confusion.  You have unusual weakness or numbness.  You feel  faint.  You have severe pain in your chest or abdomen.  You vomit repeatedly.  You have trouble breathing. Summary  Hypertension is when the force of blood pumping through your arteries is too strong. If this condition is not controlled, it may put you at risk for serious complications.  Your personal target blood pressure may vary depending on your medical conditions, your age, and other factors. For most people, a normal blood pressure is less than 120/80.  Hypertension is treated with lifestyle changes, medicines, or a combination of both. Lifestyle changes include weight loss, eating a healthy, low-sodium diet, exercising more, and limiting alcohol. This information is not intended to replace advice given to you by your health care provider. Make sure you discuss any questions you have with your health care provider. Document  Released: 09/18/2005 Document Revised: 08/16/2016 Document Reviewed: 08/16/2016 Elsevier Interactive Patient Education  2018 [employer]     Shoulder Pain Many things can cause shoulder pain, including:  An injury.  Moving the arm in the same way again and again (overuse).  Joint pain (arthritis).  Follow these instructions at home: Take these actions to help with your pain:  Squeeze a soft ball or a foam pad as much as you can. This helps to prevent swelling. It also makes the arm stronger.  Take over-the-counter and prescription medicines only as told by your doctor.  If told, put ice on the area: ? Put ice in a plastic bag. ? Place a towel between your skin and the bag. ? Leave the ice on for 20 minutes, 2-3 times per day. Stop putting on ice if it does not help with the pain.  If you were given a shoulder sling or immobilizer: ? Wear it as told. ? Remove it to shower or bathe. ? Move your arm as little as possible. ? Keep your hand moving. This helps prevent swelling.  Contact a doctor if:  Your pain gets worse.  Medicine does not help your pain.  You have new pain in your arm, hand, or fingers. Get help right away if:  Your arm, hand, or fingers: ? Tingle. ? Are numb. ? Are swollen. ? Are painful. ? Turn white or blue. This information is not intended to replace advice given to you by your health care provider. Make sure you discuss any questions you have with your health care provider. Document Released: 03/06/2008 Document Revised: 05/14/2016 Document Reviewed: 01/11/2015 Elsevier Interactive Patient Education  2018 [employer].    Neuropathic Pain Neuropathic pain is pain caused by damage to the nerves that are responsible for certain sensations in your body (sensory nerves). The pain can be caused by damage to:  The sensory nerves that send signals to your spinal cord and brain (peripheral nervous system).  The sensory nerves in your brain or  spinal cord (central nervous system).  Neuropathic pain can make you more sensitive to pain. What would be a minor sensation for most people may feel very painful if you have neuropathic pain. This is usually a long-term condition that can be difficult to treat. The type of pain can differ from person to person. It may start suddenly (acute), or it may develop slowly and last for a long time (chronic). Neuropathic pain may come and go as damaged nerves heal or may stay at the same level for years. It often causes emotional distress, loss of sleep, and a lower quality of life. What are the causes? The most common cause of  damage to a sensory nerve is diabetes. Many other diseases and conditions can also cause neuropathic pain. Causes of neuropathic pain can be classified as:  Toxic. Many drugs and chemicals can cause toxic damage. The most common cause of toxic neuropathic pain is damage from drug treatment for cancer (chemotherapy).  Metabolic. This type of pain can happen when a disease causes imbalances that damage nerves. Diabetes is the most common of these diseases. Vitamin B deficiency caused by long-term alcohol abuse is another common cause.  Traumatic. Any injury that cuts, crushes, or stretches a nerve can cause damage and pain. A common example is feeling pain after losing an arm or leg (phantom limb pain).  Compression-related. If a sensory nerve gets trapped or compressed for a long period of time, the blood supply to the nerve can be cut off.  Vascular. Many blood vessel diseases can cause neuropathic pain by decreasing blood supply and oxygen to nerves.  Autoimmune. This type of pain results from diseases in which the body's defense system mistakenly attacks sensory nerves. Examples of autoimmune diseases that can cause neuropathic pain include lupus and multiple sclerosis.  Infectious. Many types of viral infections can damage sensory nerves and cause pain. Shingles infection is a  common cause of this type of pain.  Inherited. Neuropathic pain can be a symptom of many diseases that are passed down through families (genetic).  What are the signs or symptoms? The main symptom is pain. Neuropathic pain is often described as:  Burning.  Shock-like.  Stinging.  Hot or cold.  Itching.  How is this diagnosed? No single test can diagnose neuropathic pain. Your health care provider will do a physical exam and ask you about your pain. You may use a pain scale to describe how bad your pain is. You may also have tests to see if you have a high sensitivity to pain and to help find the cause and location of any sensory nerve damage. These tests may include:  Imaging studies, such as: ? X-rays. ? CT scan. ? MRI.  Nerve conduction studies to test how well nerve signals travel through your sensory nerves (electrodiagnostic testing).  Stimulating your sensory nerves through electrodes on your skin and measuring the response in your spinal cord and brain (somatosensory evoked potentials).  How is this treated? Treatment for neuropathic pain may change over time. You may need to try different treatment options or a combination of treatments. Some options include:  Over-the-counter pain relievers.  Prescription medicines. Some medicines used to treat other conditions may also help neuropathic pain. These include medicines to: ? Control seizures (anticonvulsants). ? Relieve depression (antidepressants).  Prescription-strength pain relievers (narcotics). These are usually used when other pain relievers do not help.  Transcutaneous nerve stimulation (TENS). This uses electrical currents to block painful nerve signals. The treatment is painless.  Topical and local anesthetics. These are medicines that numb the nerves. They can be injected as a nerve block or applied to the skin.  Alternative treatments, such as: ? Acupuncture. ? Meditation. ? Massage. ? Physical  therapy. ? Pain management programs. ? Counseling.  Follow these instructions at home:  Learn as much as you can about your condition.  Take medicines only as directed by your health care provider.  Work closely with all your health care providers to find what works best for you.  Have a good support system at home.  Consider joining a chronic pain support group. Contact a health care provider if:  Your  pain treatments are not helping.  You are having side effects from your medicines.  You are struggling with fatigue, mood changes, depression, or anxiety. This information is not intended to replace advice given to you by your health care provider. Make sure you discuss any questions you have with your health care provider. Document Released: 06/15/2004 Document Revised: 04/07/2016 Document Reviewed: 02/26/2014 Elsevier Interactive Patient Education  2018 [employer].  Alcohol Use Disorder Alcohol use disorder is when your drinking disrupts your daily life. When you have this condition, you drink too much alcohol and you cannot control your drinking. Alcohol use disorder can cause serious problems with your physical health. It can affect your brain, heart, liver, pancreas, immune system, stomach, and intestines. Alcohol use disorder can increase your risk for certain cancers and cause problems with your mental health, such as depression, anxiety, psychosis, delirium, and dementia. People with this disorder risk hurting themselves and others. What are the causes? This condition is caused by drinking too much alcohol over time. It is not caused by drinking too much alcohol only one or two times. Some people with this condition drink alcohol to cope with or escape from negative life events. Others drink to relieve pain or symptoms of mental illness. What increases the risk? You are more likely to develop this condition if:  You have a family history of alcohol use disorder.  Your  culture encourages drinking to the point of intoxication, or makes alcohol easy to get.  You had a mood or conduct disorder in childhood.  You have been a victim of abuse.  You are an adolescent and: ? You have poor grades or difficulties in school. ? Your caregivers do not talk to you about saying no to alcohol, or supervise your activities. ? You are impulsive or you have trouble with self-control.  What are the signs or symptoms? Symptoms of this condition include:  Drinkingmore than you want to.  Drinking for longer than you want to.  Trying several times to drink less or to control your drinking.  Spending a lot of time getting alcohol, drinking, or recovering from drinking.  Craving alcohol.  Having problems at work, at school, or at home due to drinking.  Having problems in relationships due to drinking.  Drinking when it is dangerous to drink, such as before driving a car.  Continuing to drink even though you know you might have a physical or mental problem related to drinking.  Needing more and more alcohol to get the same effect you want from the alcohol (building up tolerance).  Having symptoms of withdrawal when you stop drinking. Symptoms of withdrawal include: ? Fatigue. ? Nightmares. ? Trouble sleeping. ? Depression. ? Anxiety. ? Fever. ? Seizures. ? Severe confusion. ? Feeling or seeing things that are not there (hallucinations). ? Tremors. ? Rapid heart rate. ? Rapid breathing. ? High blood pressure.  Drinking to avoid symptoms of withdrawal.  How is this diagnosed? This condition is diagnosed with an assessment. Your health care provider may start the assessment by asking three or four questions about your drinking. Your health care provider may perform a physical exam or do lab tests to see if you have physical problems resulting from alcohol use. She or he may refer you to a mental health professional for evaluation. How is this  treated? Some people with alcohol use disorder are able to reduce their alcohol use to low-risk levels. Others need to completely quit drinking alcohol. When necessary, mental  health professionals with specialized training in substance use treatment can help. Your health care provider can help you decide how severe your alcohol use disorder is and what type of treatment you need. The following forms of treatment are available:  Detoxification. Detoxification involves quitting drinking and using prescription medicines within the first week to help lessen withdrawal symptoms. This treatment is important for people who have had withdrawal symptoms before and for heavy drinkers who are likely to have withdrawal symptoms. Alcohol withdrawal can be dangerous, and in severe cases, it can cause death. Detoxification may be provided in a home, community, or primary care setting, or in a hospital or substance use treatment facility.  Counseling. This treatment is also called talk therapy. It is provided by substance use treatment counselors. A counselor can address the reasons you use alcohol and suggest ways to keep you from drinking again or to prevent problem drinking. The goals of talk therapy are to: ? Find healthy activities and ways for you to cope with stress. ? Identify and avoid the things that trigger your alcohol use. ? Help you learn how to handle cravings.  Medicines.Medicines can help treat alcohol use disorder by: ? Decreasing alcohol cravings. ? Decreasing the positive feeling you have when you drink alcohol. ? Causing an uncomfortable physical reaction when you drink alcohol (aversion therapy).  Support groups. Support groups are led by people who have quit drinking. They provide emotional support, advice, and guidance.  These forms of treatment are often combined. Some people with this condition benefit from a combination of treatments provided by specialized substance use treatment  centers. Follow these instructions at home:  Take over-the-counter and prescription medicines only as told by your health care provider.  Check with your health care provider before starting any new medicines.  Ask friends and family members not to offer you alcohol.  Avoid situations where alcohol is served, including gatherings where others are drinking alcohol.  Create a plan for what to do when you are tempted to use alcohol.  Find hobbies or activities that you enjoy that do not include alcohol.  Keep all follow-up visits as told by your health care provider. This is important. How is this prevented?  If you drink, limit alcohol intake to no more than 1 drink a day for nonpregnant women and 2 drinks a day for men. One drink equals 12 oz of beer, 5 oz of wine, or 1 oz of hard liquor.  If you have a mental health condition, get treatment and support.  Do not give alcohol to adolescents.  If you are an adolescent: ? Do not drink alcohol. ? Do not be afraid to say no if someone offers you alcohol. Speak up about why you do not want to drink. You can be a positive role model for your friends and set a good example for those around you by not drinking alcohol. ? If your friends drink, spend time with others who do not drink alcohol. Make new friends who do not use alcohol. ? Find healthy ways to manage stress and emotions, such as meditation or deep breathing, exercise, spending time in nature, listening to music, or talking with a trusted friend or family member. Contact a health care provider if:  You are not able to take your medicines as told.  Your symptoms get worse.  You return to drinking alcohol (relapse) and your symptoms get worse. Get help right away if:  You have thoughts about hurting yourself or others.  If you ever feel like you may hurt yourself or others, or have thoughts about taking your own life, get help right away. You can go to your nearest emergency  department or call:  Your local emergency services (911 in the [place]  A suicide crisis helpline, such as the National Suicide Prevention Lifeline at 717-800-3718. This is open 24 hours a day.  Summary  Alcohol use disorder is when your drinking disrupts your daily life. When you have this condition, you drink too much alcohol and you cannot control your drinking.  Treatment may include detoxification, counseling, medicine, and support groups.  Ask friends and family members not to offer you alcohol. Avoid situations where alcohol is served.  Get help right away if you have thoughts about hurting yourself or others. This information is not intended to replace advice given to you by your health care provider. Make sure you discuss any questions you have with your health care provider. Document Released: 10/26/2004 Document Revised: 06/15/2016 Document Reviewed: 06/15/2016 Elsevier Interactive Patient Education  [employer].

## 2017-05-10 DIAGNOSIS — Z789 Other specified health status: Secondary | ICD-10-CM | POA: Insufficient documentation

## 2017-05-10 DIAGNOSIS — Z7289 Other problems related to lifestyle: Secondary | ICD-10-CM | POA: Insufficient documentation

## 2017-06-05 ENCOUNTER — Ambulatory Visit: Payer: Self-pay | Admitting: Family Medicine

## 2017-06-05 VITALS — BP 134/82 | HR 72 | Ht 71.0 in

## 2017-06-05 DIAGNOSIS — R030 Elevated blood-pressure reading, without diagnosis of hypertension: Secondary | ICD-10-CM

## 2017-06-05 NOTE — Progress Notes (Signed)
Increase Gabapentin 300 mg 4 times per day and resume cyclobenzaprine at night. Complete [facility] so that he can be referred to orthopedics.

## 2017-06-25 ENCOUNTER — Ambulatory Visit (INDEPENDENT_AMBULATORY_CARE_PROVIDER_SITE_OTHER): Payer: Self-pay | Admitting: Orthopaedic Surgery

## 2017-06-25 DIAGNOSIS — M5412 Radiculopathy, cervical region: Secondary | ICD-10-CM

## 2017-06-25 DIAGNOSIS — M542 Cervicalgia: Secondary | ICD-10-CM

## 2017-06-25 MED ORDER — TRAMADOL HCL 50 MG PO TABS: 50.0000 mg | ORAL_TABLET | Freq: Four times a day (QID) | ORAL | 0 refills | Status: DC | PRN

## 2017-06-25 MED ORDER — PREDNISONE 10 MG (21) PO TBPK: ORAL_TABLET | ORAL | 0 refills | Status: DC

## 2017-06-25 MED FILL — predniSONE 10 MG TABS: 10 | 6 days supply | Qty: 21 | Fill #0

## 2017-06-25 NOTE — Addendum Note (Signed)
Addended by: Albertina Parr on: 06/25/2017 09:31 AM   Modules accepted: Orders
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
## 2017-07-10 ENCOUNTER — Emergency Department (HOSPITAL_COMMUNITY)
Admission: EM | Admit: 2017-07-10 | Discharge: 2017-07-10 | Disposition: A | Discharge status: Home / Self Care | Payer: Self-pay | Attending: Emergency Medicine | Admitting: Emergency Medicine

## 2017-07-10 ENCOUNTER — Telehealth: Payer: Self-pay

## 2017-07-10 ENCOUNTER — Encounter (HOSPITAL_COMMUNITY): Payer: Self-pay

## 2017-07-10 DIAGNOSIS — F17200 Nicotine dependence, unspecified, uncomplicated: Secondary | ICD-10-CM | POA: Insufficient documentation

## 2017-07-10 DIAGNOSIS — G8929 Other chronic pain: Secondary | ICD-10-CM | POA: Insufficient documentation

## 2017-07-10 DIAGNOSIS — M542 Cervicalgia: Secondary | ICD-10-CM | POA: Insufficient documentation

## 2017-07-10 MED ORDER — TRAMADOL HCL 50 MG PO TABS: 50.0000 mg | ORAL_TABLET | Freq: Four times a day (QID) | ORAL | 0 refills | Status: DC | PRN

## 2017-07-10 MED ORDER — OXYCODONE-ACETAMINOPHEN 5-325 MG PO TABS
1.0000 | ORAL_TABLET | Freq: Once | ORAL | Status: AC
  Administered 2017-07-10: 1 via ORAL
  Filled 2017-07-10: qty 1

## 2017-07-10 MED ORDER — CYCLOBENZAPRINE HCL 10 MG PO TABS: 10.0000 mg | ORAL_TABLET | Freq: Two times a day (BID) | ORAL | 0 refills | Status: DC | PRN

## 2017-07-10 MED FILL — ?CYCLOBENZAPRINE 10 MG TABL: 10 | 10 days supply | Qty: 20 | Fill #0

## 2017-07-10 MED FILL — traMADol HCL 50 MG TABS: 50 | 3 days supply | Qty: 15 | Fill #0

## 2017-07-10 NOTE — ED Provider Notes (Signed)
[facility]-EMERGENCY DEPT Provider Note   CSN: [id number] Arrival date & time: 07/10/17  0744     History   Chief Complaint Chief Complaint  Patient presents with  . Neck Pain    HPI Vincent Davis is a 41 y.o. male.  HPI  Patient presents to ED for evaluation of left-sided neck pain radiating to left arm that has been present for the past 2 months. He has been seen here previously for similar symptoms and evaluated by his PCP and orthopedic surgeon. He is scheduled for an MRI tomorrow but he states that "I haven't been able to sleep for the past 2 days and I need something for my pain." States that in the past he has tried steroids, tramadol, anti-inflammatories and muscle relaxer with no improvement in his symptoms. He states that there are few medications that didn't work for him but he is unsure what the name of them was. He denies any additional injury or new symptoms. He denies any chest pain, trouble breathing.  History reviewed. No pertinent past medical history.  Patient Active Problem List   Diagnosis Date Noted  . Cervical radiculopathy 06/25/2017  . Alcohol use 05/10/2017    History reviewed. No pertinent surgical history.     Home Medications    Prior to Admission medications   Medication Sig Start Date End Date Taking? Authorizing Provider  acetaminophen (TYLENOL) 500 MG tablet Take 1 tablet (500 mg total) by mouth every 6 (six) hours as needed. Patient taking differently: Take 1,000 mg by mouth every 6 (six) hours as needed for mild pain.  04/21/17  Yes Law, Waylan Boga, PA-C  gabapentin (NEURONTIN) 300 MG capsule Take 1 capsule (300 mg total) by mouth 3 (three) times daily as needed. Make take an additional dose if needed for nerve pain. 05/03/17  Yes Bing Neighbors, FNP  cephALEXin (KEFLEX) 500 MG capsule Take 1 capsule (500 mg total) by mouth 4 (four) times daily. Patient not taking: Reported on 05/03/2017 01/29/17   Felicie Morn, NP  ciprofloxacin (CILOXAN)  0.3 % ophthalmic solution Place 1 drop into the right eye every 2 (two) hours. Administer 1 drop, every 2 hours, while awake, for 2 days. Then 1 drop, every 4 hours, while awake, for the next 5 days. Patient not taking: Reported on 05/03/2017 02/06/17   Garlon Hatchet, PA-C  cyclobenzaprine (FLEXERIL) 10 MG tablet Take 1 tablet (10 mg total) by mouth 2 (two) times daily as needed for muscle spasms. 07/10/17   Domnick Chervenak, PA-C  ketorolac (ACULAR) 0.5 % ophthalmic solution Place 1 drop into both eyes every 6 (six) hours. Patient not taking: Reported on 05/03/2017 02/06/17   Garlon Hatchet, PA-C  methocarbamol (ROBAXIN) 500 MG tablet Take 1 tablet (500 mg total) by mouth 2 (two) times daily. Patient not taking: Reported on 05/03/2017 04/21/17   Emi Holes, PA-C  naproxen (NAPROSYN) 500 MG tablet Take 1 tablet (500 mg total) by mouth 2 (two) times daily. Patient not taking: Reported on 05/03/2017 04/21/17   Emi Holes, PA-C  traMADol (ULTRAM) 50 MG tablet Take 1 tablet (50 mg total) by mouth every 6 (six) hours as needed. 07/10/17   Dietrich Pates, PA-C    Family History No family history on file.  Social History Social History  Substance Use Topics  . Smoking status: Light Tobacco Smoker  . Smokeless tobacco: Never Used  . Alcohol use Yes     Allergies   Patient has no known allergies.  Review of Systems Review of Systems  Constitutional: Negative for chills and fever.  Respiratory: Negative for shortness of breath.   Cardiovascular: Negative for chest pain.  Gastrointestinal: Negative for nausea and vomiting.  Musculoskeletal: Positive for arthralgias, myalgias and neck pain. Negative for joint swelling.  Skin: Negative for wound.  Neurological: Negative for weakness and numbness.     Physical Exam Updated Vital Signs BP (!) 145/85 (BP Location: Right Arm)   Pulse 77   Temp 99 F (37.2 C) (Oral)   Resp 18   Ht  (1.803 m)   Wt 101.6 kg (224 lb)   SpO2 99%   BMI  31.24 kg/m   Physical Exam  Constitutional: He appears well-developed and well-nourished. No distress.  HENT:  Head: Normocephalic and atraumatic.  Eyes: Conjunctivae and EOM are normal. No scleral icterus.  Neck: Normal range of motion.  Pulmonary/Chest: Effort normal. No respiratory distress.  Musculoskeletal: Normal range of motion. He exhibits tenderness. He exhibits no edema or deformity.       Arms: Diffuse tenderness to palpation of the left paraspinal musculature in the cervical level and left arm. No visible deformity, bruising, color temperature change noted. Sensation intact to light touch of left arm. Strength 5/5 in bilateral upper extremities.  Neurological: He is alert.  Skin: No rash noted. He is not diaphoretic.  Psychiatric: He has a normal mood and affect.  Nursing note and vitals reviewed.    ED Treatments / Results  Labs (all labs ordered are listed, but only abnormal results are displayed) Labs Reviewed - No data to display  EKG  EKG Interpretation None       Radiology No results found.  Procedures Procedures (including critical care time)  Medications Ordered in ED Medications  oxyCODONE-acetaminophen (PERCOCET/ROXICET) 5-325 MG per tablet 1 tablet (not administered)     Initial Impression / Assessment and Plan / ED Course  I have reviewed the triage vital signs and the nursing notes.  Pertinent labs & imaging results that were available during my care of the patient were reviewed by me and considered in my medical decision making (see chart for details).     Patient presents to ED for evaluation of his ongoing neck pain for the past 2 months. He was injured initially when symptoms began several months ago but he had not experienced the pain until a few months later. He has been valuated by his PCP and orthopedic specialists and is scheduled for an MRI tomorrow. He was told that his pain is due to bulging disc in his neck. He denies any new  symptoms or new injuries. He is here today because he states that he needs something for his pain and to help him sleep. In the past history right steroids, tramadol, anti-inflammatories and Robaxin with no improvement in his symptoms. On physical exam patient is overall well-appearing. He does get frustrated when I told him that I cannot give him any narcotic pain medications to take home. He states that "like you give me any, will my PCP be able to give me any?" I informed patient that it is a better idea for his primary care provider or orthopedic specialist to prescribe him these medications and the restrictions that we have here in the emergency room. I informed him that it is in his best interest to follow-up with his orthopedist and to complete the MRI tomorrow. We'll give a dose of pain medication here in the ED and will discharge with tramadol  and Flexeril to be taken. I suspect that this is just an acute flareup of his chronic pain. Patient appears stable for discharge at this time. Strict return precautions given.  Final Clinical Impressions(s) / ED Diagnoses   Final diagnoses:  Chronic neck pain    New Prescriptions New Prescriptions   CYCLOBENZAPRINE (FLEXERIL) 10 MG TABLET    Take 1 tablet (10 mg total) by mouth 2 (two) times daily as needed for muscle spasms.   TRAMADOL (ULTRAM) 50 MG TABLET    Take 1 tablet (50 mg total) by mouth every 6 (six) hours as needed.     Dietrich Pates, PA-C 07/10/17 1015    Tilden Fossa, MD 07/11/17 [phone]

## 2017-07-10 NOTE — ED Triage Notes (Signed)
Per Pt, Pt is coming from home with complaints of left neck pain and left arm pain secondary to a fall in May that has been consistently worse for two months. Pt has been checked and seen for this and it is reported to be musculoskeletal. Pt has MRI scheduled tomorrow, but reports he has not been able to sleep for two days.

## 2017-07-10 NOTE — Telephone Encounter (Signed)
Spoke with patient and advise him to follow up with Dr. Roda Shutters and patient states that he doesn't have appointment until 10/15 and want to see [name] before then.

## 2017-07-10 NOTE — Telephone Encounter (Signed)
Left a v for patient to callback

## 2017-07-10 NOTE — Discharge Instructions (Signed)
Take tramadol and Flexeril as needed for neck pain and spasms. Follow-up with your PCP and orthopedist for further evaluation. Return to ED for additional injury, numbness and arm, fevers, trouble breathing or chest pain.

## 2017-07-10 NOTE — Discharge Planning (Signed)
Caiya Bettes J. Lucretia Roers, RN, BSN, [employer] [phone] [facility] spoke with pt at bedside who confirms self pay and is a [facility] resident with no PCP.  EDCM advised pt to call [facility] to set up PCP appointment. Pt notified that he may utilize [facility] for Rx needs upon discharge today.

## 2017-07-10 NOTE — Telephone Encounter (Signed)
Vincent Davis,  Call patient and advise him to follow-up with Dr. Roda Shutters as he has been referred to orthopedics for his chronic neck pain and was seen and treated on 06/25/2017.  [name]. [name], MSN, FNP-C [facility]  [address] [place], [place] [address] [phone]

## 2017-07-11 ENCOUNTER — Encounter: Payer: Self-pay | Admitting: Family Medicine

## 2017-07-11 ENCOUNTER — Ambulatory Visit: Payer: Self-pay

## 2017-07-11 ENCOUNTER — Ambulatory Visit (INDEPENDENT_AMBULATORY_CARE_PROVIDER_SITE_OTHER): Payer: Self-pay | Admitting: Family Medicine

## 2017-07-11 VITALS — BP 140/70 | HR 84 | Temp 98.1°F | Resp 14 | Ht 71.0 in | Wt 237.0 lb

## 2017-07-11 DIAGNOSIS — M545 Low back pain, unspecified: Secondary | ICD-10-CM

## 2017-07-11 DIAGNOSIS — M79602 Pain in left arm: Secondary | ICD-10-CM

## 2017-07-11 MED ORDER — GABAPENTIN 300 MG PO CAPS: 600.0000 mg | ORAL_CAPSULE | Freq: Three times a day (TID) | ORAL | 0 refills | Status: DC

## 2017-07-11 MED ORDER — KETOROLAC TROMETHAMINE 60 MG/2ML IM SOLN
60.0000 mg | Freq: Once | INTRAMUSCULAR | Status: AC
  Administered 2017-07-11: 60 mg via INTRAMUSCULAR

## 2017-07-11 NOTE — Progress Notes (Signed)
Patient ID: Vincent Davis, male    DOB: 29-Feb-1976, 41 y.o.   MRN: [id number]  PCP: Bing Neighbors, FNP  Chief Complaint  Patient presents with  . Neck Pain  . Back Pain  . Arm Pain    Subjective:  HPI Vincent Davis is a 41 y.o. male presents for evaluation of chronic neck, back, and arm pain. Vincent Davis has been seen in clinic for the same complaints previously and has been referred to orthopedics for evaluation. He was most recently seen [facility] Emergency department on 07/10/2017 with complaints of pain and requesting pain medication.  During that same encounter he called the clinic requesting controlled with pain medication. He is scheduled today for an MRI of the cervical spine and he requests medication to assist with lying down for study. He has a followed schedule with Dr. Roda Shutters next week for evaluation of MRI findings.  Social History   Social History  . Marital status: Single    Spouse name: N/A  . Number of children: N/A  . Years of education: N/A   Occupational History  . Not on file.   Social History Main Topics  . Smoking status: Light Tobacco Smoker  . Smokeless tobacco: Never Used  . Alcohol use Yes  . Drug use: No  . Sexual activity: Not on file   Other Topics Concern  . Not on file   Social History Narrative  . No narrative on file   Family History  Problem Relation Age of Onset  . Family history unknown: Yes   Review of Systems See HPI  Patient Active Problem List   Diagnosis Date Noted  . Cervical radiculopathy 06/25/2017  . Alcohol use 05/10/2017    No Known Allergies  Prior to Admission medications   Medication Sig Start Date End Date Taking? Authorizing Provider  cyclobenzaprine (FLEXERIL) 10 MG tablet Take 1 tablet (10 mg total) by mouth 2 (two) times daily as needed for muscle spasms. 07/10/17  Yes Khatri, Hina, PA-C  traMADol (ULTRAM) 50 MG tablet Take 1 tablet (50 mg total) by mouth every 6 (six) hours as needed. 07/10/17   Yes Khatri, Hina, PA-C  acetaminophen (TYLENOL) 500 MG tablet Take 1 tablet (500 mg total) by mouth every 6 (six) hours as needed. Patient not taking: Reported on 07/11/2017 04/21/17   Emi Holes, PA-C  ciprofloxacin (CILOXAN) 0.3 % ophthalmic solution Place 1 drop into the right eye every 2 (two) hours. Administer 1 drop, every 2 hours, while awake, for 2 days. Then 1 drop, every 4 hours, while awake, for the next 5 days. Patient not taking: Reported on 05/03/2017 02/06/17   Garlon Hatchet, PA-C  gabapentin (NEURONTIN) 300 MG capsule Take 1 capsule (300 mg total) by mouth 3 (three) times daily as needed. Make take an additional dose if needed for nerve pain. Patient not taking: Reported on 07/11/2017 05/03/17   Bing Neighbors, FNP  ketorolac (ACULAR) 0.5 % ophthalmic solution Place 1 drop into both eyes every 6 (six) hours. Patient not taking: Reported on 05/03/2017 02/06/17   Garlon Hatchet, PA-C  methocarbamol (ROBAXIN) 500 MG tablet Take 1 tablet (500 mg total) by mouth 2 (two) times daily. Patient not taking: Reported on 05/03/2017 04/21/17   Emi Holes, PA-C  naproxen (NAPROSYN) 500 MG tablet Take 1 tablet (500 mg total) by mouth 2 (two) times daily. Patient not taking: Reported on 05/03/2017 04/21/17   Emi Holes, PA-C    Past  Medical, Surgical Family and Social History reviewed and updated.    Objective:   Today's Vitals   07/11/17 0929  BP: 140/70  Pulse: 84  Resp: 14  Temp: 98.1 F (36.7 C)  TempSrc: Oral  SpO2: 99%  Weight: 237 lb (107.5 kg)  Height:  (1.803 m)    Wt Readings from Last 3 Encounters:  07/11/17 237 lb (107.5 kg)  07/10/17 224 lb (101.6 kg)  05/03/17 224 lb (101.6 kg)    Physical Exam  Constitutional: He is oriented to person, place, and time. He appears well-developed and well-nourished.  HENT:  Head: Normocephalic.  Cardiovascular: Normal rate and regular rhythm.   Pulmonary/Chest: Effort normal and breath sounds normal.   Musculoskeletal: He exhibits tenderness.  Left Arm: Limited external and internal rotation.Pain produced with adduction.  Cervical neck: tenderness with hyperextension and forward flexion  Neurological: He is alert and oriented to person, place, and time.  Skin: Skin is warm and dry.  Psychiatric: His behavior is normal. Judgment and thought content normal. His mood appears anxious.   Assessment & Plan:  1. Acute bilateral low back pain without sciatica- ketorolac (TORADOL) injection 60 mg; Inject 2 mLs (60 mg total) into the muscle once .2. Left arm pain-ketorolac (TORADOL) injection 60 mg; Inject 2 mLs (60 mg total) into the muscle once.  Continue with the MRI Continue follow-up with Dr. Roda Shutters.  RTC: Return for follow-up in 1 month  [name]. [name], MSN, FNP-C [facility]  [address] [place], [place] [address] [phone]

## 2017-07-13 ENCOUNTER — Ambulatory Visit
Admission: RE | Admit: 2017-07-13 | Discharge: 2017-07-13 | Disposition: A | Discharge status: Home / Self Care | Payer: No Typology Code available for payment source | Source: Ambulatory Visit | Attending: Orthopaedic Surgery | Admitting: Orthopaedic Surgery

## 2017-07-13 DIAGNOSIS — M542 Cervicalgia: Secondary | ICD-10-CM

## 2017-07-16 ENCOUNTER — Ambulatory Visit (INDEPENDENT_AMBULATORY_CARE_PROVIDER_SITE_OTHER): Payer: Self-pay | Admitting: Orthopaedic Surgery

## 2017-07-16 ENCOUNTER — Encounter: Payer: Self-pay | Admitting: Family Medicine

## 2017-07-16 ENCOUNTER — Encounter (INDEPENDENT_AMBULATORY_CARE_PROVIDER_SITE_OTHER): Payer: Self-pay | Admitting: Orthopaedic Surgery

## 2017-07-16 DIAGNOSIS — M79642 Pain in left hand: Secondary | ICD-10-CM

## 2017-07-16 DIAGNOSIS — M5412 Radiculopathy, cervical region: Secondary | ICD-10-CM

## 2017-07-16 MED ORDER — OXYCODONE-ACETAMINOPHEN 5-325 MG PO TABS: 1.0000 | ORAL_TABLET | Freq: Two times a day (BID) | ORAL | 0 refills | Status: DC | PRN

## 2017-07-16 NOTE — Progress Notes (Signed)
   Office Visit Note   Patient: Vincent Davis           Date of Birth: September 16, 1976           MRN: [id number] Visit Date: 07/16/2017              Requested by: Bing Neighbors, FNP [address] [place], [place] [address] PCP: Bing Neighbors, FNP   Assessment & Plan: Visit Diagnoses:  1. Cervical radiculopathy     Plan: Recommend nerve conduction studies to evaluate for left carpal tunnel syndrome. We'll also refer Dr. Alvester Morin for epidural steroid injection. 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  Prescription for Percocet. Follow-up as needed. Total face to face encounter time was greater than 25 minutes and over half of this time was spent in counseling and/or coordination of care.  Follow-Up Instructions: Return if symptoms worsen or fail to improve.   Orders:  No orders of the defined types were placed in this encounter.  Meds ordered this encounter  Medications  . oxyCODONE-acetaminophen (PERCOCET) 5-325 MG tablet    Sig: Take 1 tablet by mouth 2 (two) times daily as needed for severe pain.    Dispense:  20 tablet    Refill:  0      Procedures: No procedures performed   Clinical Data: No additional findings.   Subjective: Chief Complaint  Patient presents with  . Neck - Pain    Patient follows up today for his cervical spine MRI. He continues to have pain in his left upper extremity with numbness in his fingertips. Tramadol has not significantly help. He did state that Percocet have helped in the past. He has difficulty with falling asleep due to the pain    Review of Systems  Constitutional: Negative.   All other systems reviewed and are negative.    Objective: Vital Signs: There were no vitals taken for this visit.  Physical Exam  Constitutional: He is oriented to person, place, and time. He appears well-developed and  well-nourished.  Pulmonary/Chest: Effort normal.  Abdominal: Soft.  Neurological: He is alert and oriented to person, place, and time.  Skin: Skin is warm.  Psychiatric: He has a normal mood and affect. His behavior is normal. Judgment and thought content normal.  Nursing note and vitals reviewed.   Ortho Exam Cervical spine exam is stable. Left hand exam shows positive carpal tunnel compression signs. Specialty Comments:  No specialty comments available.  Imaging: No results found.   PMFS History: Patient Active Problem List   Diagnosis Date Noted  . Cervical radiculopathy 06/25/2017  . Alcohol use 05/10/2017   No past medical history on file.  No family history on file.  No past surgical history on file. Social History   Occupational History  . Not on file.   Social History Main Topics  . Smoking status: Light Tobacco Smoker  . Smokeless tobacco: Never Used  . Alcohol use Yes  . Drug use: No  . Sexual activity: Not on file

## 2017-07-16 NOTE — Addendum Note (Signed)
Addended by: Albertina Parr on: 07/16/2017 09:13 AM   Modules accepted: Orders

## 2017-07-18 ENCOUNTER — Telehealth: Payer: Self-pay | Admitting: Family Medicine

## 2017-07-18 ENCOUNTER — Telehealth: Payer: Self-pay

## 2017-07-18 ENCOUNTER — Telehealth (INDEPENDENT_AMBULATORY_CARE_PROVIDER_SITE_OTHER): Payer: Self-pay | Admitting: Orthopaedic Surgery

## 2017-07-18 NOTE — Telephone Encounter (Signed)
yes

## 2017-07-18 NOTE — Telephone Encounter (Signed)
Patient called wanting to know if he can get MD note for work stating that you cant return work until patient can get surgery or injections

## 2017-07-18 NOTE — Telephone Encounter (Signed)
Please advise 

## 2017-07-18 NOTE — Telephone Encounter (Signed)
The letter will only state that he is managed for chronic condition and will not provide any details as to his condition. I have not written him out of work and I am not managing his neck pain -he's been referred to orthopedics and I do not see that Dr. Roda ShuttersXu has written him out of work. He can address his ability to work with him. Letter is completed.   Godfrey PickKimberly S. Tiburcio PeaHarris, MSN, FNP-C [facility]  [address] [place], [place] [id number] [phone]

## 2017-07-19 NOTE — Telephone Encounter (Signed)
Message sent in error

## 2017-07-19 NOTE — Telephone Encounter (Signed)
Letter printed stamped and Dois DavenportSandra will fax to patient per patients request

## 2017-07-26 ENCOUNTER — Ambulatory Visit (INDEPENDENT_AMBULATORY_CARE_PROVIDER_SITE_OTHER): Payer: Self-pay | Admitting: Physical Medicine and Rehabilitation

## 2017-07-26 ENCOUNTER — Encounter (INDEPENDENT_AMBULATORY_CARE_PROVIDER_SITE_OTHER): Payer: Self-pay | Admitting: Physical Medicine and Rehabilitation

## 2017-07-26 VITALS — BP 150/77 | HR 95

## 2017-07-26 DIAGNOSIS — M5412 Radiculopathy, cervical region: Secondary | ICD-10-CM

## 2017-07-26 NOTE — Progress Notes (Deleted)
Left sided neck and arm pain. Gets worse when he turns his head to left. Scheduled for nerve study tomorrow. Says shot is "not going to help his neck but surgery will."

## 2017-07-27 ENCOUNTER — Encounter (INDEPENDENT_AMBULATORY_CARE_PROVIDER_SITE_OTHER): Payer: Self-pay | Admitting: Physical Medicine and Rehabilitation

## 2017-07-31 ENCOUNTER — Telehealth (INDEPENDENT_AMBULATORY_CARE_PROVIDER_SITE_OTHER): Payer: Self-pay | Admitting: Orthopaedic Surgery

## 2017-07-31 MED ORDER — HYDROCODONE-ACETAMINOPHEN 5-325 MG PO TABS: 1.0000 | ORAL_TABLET | Freq: Every day | ORAL | 0 refills | Status: DC | PRN

## 2017-07-31 NOTE — Telephone Encounter (Signed)
Please advise 

## 2017-07-31 NOTE — Telephone Encounter (Signed)
Patient called needing a call back concerning Rx. The number to contact patient is [phone]

## 2017-07-31 NOTE — Telephone Encounter (Signed)
I can prescribe one time of #20 of norco if he wants that. No oxycodone

## 2017-07-31 NOTE — Telephone Encounter (Signed)
Patient called asking for a refill on oxycodone. CB # [phone]

## 2017-07-31 NOTE — Addendum Note (Signed)
Addended by: Albertina ParrGARCIA, Bandy Honaker on: 07/31/2017 04:19 PM   Modules accepted: Orders

## 2017-08-01 NOTE — Telephone Encounter (Signed)
Patient aware.

## 2017-08-01 NOTE — Telephone Encounter (Signed)
Rx ready for pick up at the front desk

## 2017-08-03 ENCOUNTER — Ambulatory Visit: Payer: No Typology Code available for payment source | Admitting: Family Medicine

## 2017-08-07 ENCOUNTER — Encounter (INDEPENDENT_AMBULATORY_CARE_PROVIDER_SITE_OTHER): Payer: Self-pay | Admitting: Physical Medicine and Rehabilitation

## 2017-08-07 ENCOUNTER — Ambulatory Visit (INDEPENDENT_AMBULATORY_CARE_PROVIDER_SITE_OTHER): Payer: Self-pay | Admitting: Physical Medicine and Rehabilitation

## 2017-08-07 DIAGNOSIS — R202 Paresthesia of skin: Secondary | ICD-10-CM

## 2017-08-07 NOTE — Progress Notes (Deleted)
Left sided neck and arm pain with numbness in  All fingers. Right hand dominant. Has tingling sensation in arm with sitting. Numbness increases with turning head to left.
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
## 2017-08-09 NOTE — Progress Notes (Signed)
Vincent DyerSamuel Keith Davis - 41 y.o. male MRN [id number]  Date of birth: 01/27/1976  Office Visit Note: Visit Date: 08/07/2017 PCP: Bing NeighborsHarris, Kimberly S, FNP Referred by: Bing NeighborsHarris, Kimberly S, FNP  Subjective: Chief Complaint  Patient presents with  . Neck - Pain  . Left Arm - Pain, Numbness  . Left Hand - Numbness   HPI: [name] is a 41 year old right-hand-dominant gentleman that we recently saw request of Dr. Rhona Leavenshiu who is been following him from an orthopedic standpoint.  He has had MRI of the cervical spine which is again reviewed below and we reviewed with the patient recently.  We saw him for evaluation and management and those notes can be reviewed he has had no change in symptoms since I last saw him.    ROS Otherwise per HPI.  Assessment & Plan: Visit Diagnoses:  1. Paresthesia of skin     Plan: No additional findings.  Impression: The above electrodiagnostic study is ABNORMAL and reveals evidence of moderate chronic C7 radiculopathy on the left.    There is no significant electrodiagnostic evidence of any other focal nerve entrapment, brachial plexopathy or generalized peripheral neuropathy.   Recommendations: 1.  Follow-up with referring physician. 2.  Suggest Spine/Neurosurgical evaluation.   Meds & Orders: No orders of the defined types were placed in this encounter.   Orders Placed This Encounter  Procedures  . NCV with EMG (electromyography)    Follow-up: Return in about 2 weeks (around 08/21/2017) for Dr. Roda ShuttersXu.   Procedures: No procedures performed  EMG & NCV Findings: Evaluation of the left ulnar motor nerve showed borderline decreased conduction velocity (B Elbow-Wrist, 49 m/s).  The left radial sensory nerve showed prolonged distal peak latency (3.5 ms).  The left ulnar sensory nerve showed reduced amplitude (3.7 V).  All remaining nerves (as indicated in the following tables) were within normal limits.    Needle evaluation of the left extensor digitorum  communis muscle showed increased insertional activity and slightly increased spontaneous activity.  The left triceps muscle showed increased spontaneous activity and diminished recruitment.  All remaining muscles (as indicated in the following table) showed no evidence of electrical instability.    Impression: The above electrodiagnostic study is ABNORMAL and reveals evidence of moderate chronic C7 radiculopathy on the left.    There is no significant electrodiagnostic evidence of any other focal nerve entrapment, brachial plexopathy or generalized peripheral neuropathy.   Recommendations: 1.  Follow-up with referring physician. 2.  Suggest Spine/Neurosurgical evaluation.  Nerve Conduction Studies Anti Sensory Summary Table   Stim Site NR Peak (ms) Norm Peak (ms) P-T Amp (V) Norm P-T Amp Site1 Site2 Delta-P (ms) Dist (cm) Vel (m/s) Norm Vel (m/s)  Left Median Acr Palm Anti Sensory (2nd Digit)  32.1C  Wrist    3.0 <3.6 26.2 >10 Wrist Palm 1.3 0.0    Palm    1.7 <2.0 21.3         Left Radial Anti Sensory (Base 1st Digit)  31.6C  Wrist    *3.5 <3.1 9.0  Wrist Base 1st Digit 3.5 0.0    Site 2    3.5  4.2         Left Ulnar Anti Sensory (5th Digit)  32.3C  Wrist    3.7 <3.7 *3.7 >15.0 Wrist 5th Digit 3.7 14.0 38 >38   Motor Summary Table   Stim Site NR Onset (ms) Norm Onset (ms) O-P Amp (mV) Norm O-P Amp Site1 Site2 Delta-0 (ms) Dist (cm) Vel (  m/s) Norm Vel (m/s)  Left Median Motor (Abd Poll Brev)  31.8C  Wrist    3.4 <4.2 8.4 >5 Elbow Wrist 4.1 22.3 54 >50  Elbow    7.5  8.5         Left Ulnar Motor (Abd Dig Min)  31.8C  Wrist    3.5 <4.2 6.0 >3 B Elbow Wrist 4.3 21.0 *49 >50  B Elbow    7.8  10.4  A Elbow B Elbow 2.0 10.0 50 >50  A Elbow    9.8  10.9          EMG   Side Muscle Nerve Root Ins Act Fibs Psw Amp Dur Poly Recrt Int Dennie BiblePat Comment  Left 1stDorInt Ulnar C8-T1 Nml Nml Nml Nml Nml 0 Nml Nml   Left Abd Poll Brev Median C8-T1 Nml Nml Nml Nml Nml 0 Nml Nml   Left  ExtDigCom   *Incr *1+ *1+ Nml Nml 0 Nml Nml   Left Triceps Radial C6-7-8 Nml *3+ *3+ Nml Nml 0 *Reduced Nml   Left Deltoid Axillary C5-6 Nml Nml Nml Nml Nml 0 Nml Nml   Left BrachioRad Radial C5-6 Nml Nml Nml Nml Nml 0 Nml Nml     Nerve Conduction Studies Anti Sensory Left/Right Comparison   Stim Site L Lat (ms) R Lat (ms) L-R Lat (ms) L Amp (V) R Amp (V) L-R Amp (%) Site1 Site2 L Vel (m/s) R Vel (m/s) L-R Vel (m/s)  Median Acr Palm Anti Sensory (2nd Digit)  32.1C  Wrist 3.0   26.2   Wrist Palm     Palm 1.7   21.3         Radial Anti Sensory (Base 1st Digit)  31.6C  Wrist *3.5   9.0   Wrist Base 1st Digit     Site 2 3.5   4.2         Ulnar Anti Sensory (5th Digit)  32.3C  Wrist 3.7   *3.7   Wrist 5th Digit 38     Motor Left/Right Comparison   Stim Site L Lat (ms) R Lat (ms) L-R Lat (ms) L Amp (mV) R Amp (mV) L-R Amp (%) Site1 Site2 L Vel (m/s) R Vel (m/s) L-R Vel (m/s)  Median Motor (Abd Poll Brev)  31.8C  Wrist 3.4   8.4   Elbow Wrist 54    Elbow 7.5   8.5         Ulnar Motor (Abd Dig Min)  31.8C  Wrist 3.5   6.0   B Elbow Wrist *49    B Elbow 7.8   10.4   A Elbow B Elbow 50    A Elbow 9.8   10.9            Waveforms:            Clinical History: MRI CERVICAL SPINE WITHOUT CONTRAST  TECHNIQUE: Multiplanar, multisequence MR imaging of the cervical spine was performed. No intravenous contrast was administered.  COMPARISON:  CT scan dated 01/22/2017  FINDINGS: Alignment: Slight straightening of the normal cervical lordosis.  Vertebrae: No fracture, evidence of discitis, or bone lesion. No facet arthritis.  Cord: Normal signal and morphology.  Posterior Fossa, vertebral arteries, paraspinal tissues: Negative.  Disc levels:  Craniocervical junction through C3-4:  Negative.  C4-5: Small broad-based disc bulge slightly asymmetric to the right with no neural impingement. Widely patent neural foramina.  C5-6: Slight disc space narrowing. Small  broad-based disc bulge without neural impingement. Widely  patent neural foramina.  C6-7: Disc space narrowing. Small broad-based disc bulge with accompanying osteophytes without neural impingement. Mild to moderate bilateral foraminal narrowing.  C7-T1:  Negative.  IMPRESSION: 1. 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 2. Slight degenerative disc disease at C4-5 and C5-6 without neural impingement.   Electronically Signed   By: Francene Boyers M.D.   On: 07/13/2017 11:19  He reports that he has been smoking.  he has never used smokeless tobacco.  Recent Labs    05/03/17 1031  HGBA1C 5.1    Objective:  VS:  HT:    WT:   BMI:     BP:   HR: bpm  TEMP: ( )  RESP:  Physical Exam  Constitutional: He is oriented to person, place, and time. He appears well-developed and well-nourished. No distress.  HENT:  Head: Normocephalic and atraumatic.  Eyes: Conjunctivae are normal. Pupils are equal, round, and reactive to light.  Neck: Normal range of motion. Neck supple.  Cardiovascular: Regular rhythm and intact distal pulses.  Pulmonary/Chest: Effort normal. No respiratory distress.  Musculoskeletal:  Examination of the left upper extremity is no different than we saw him in previous exam.  He has no wrist drop.  He has nondermatomal numbness and tingling with altered sensation.  Neurological: He is alert and oriented to person, place, and time. He exhibits normal muscle tone.  Skin: Skin is warm and dry. No rash noted. No erythema.  Psychiatric: He has a normal mood and affect.  Nursing note and vitals reviewed.   Ortho Exam Imaging: No results found.  Past Medical/Family/Surgical/Social History: Medications & Allergies reviewed per EMR Patient Active Problem List   Diagnosis Date Noted  . Cervical radiculopathy 06/25/2017  . Alcohol use 05/10/2017   History reviewed. No pertinent past medical history. Family History    Family history unknown: Yes   History reviewed. No pertinent surgical history. Social History   Occupational History  . Not on file  Tobacco Use  . Smoking status: Light Tobacco Smoker  . Smokeless tobacco: Never Used  Substance and Sexual Activity  . Alcohol use: Yes  . Drug use: No  . Sexual activity: Not on file

## 2017-08-09 NOTE — Procedures (Signed)
EMG & NCV Findings: Evaluation of the left ulnar motor nerve showed borderline decreased conduction velocity (B Elbow-Wrist, 49 m/s).  The left radial sensory nerve showed prolonged distal peak latency (3.5 ms).  The left ulnar sensory nerve showed reduced amplitude (3.7 V).  All remaining nerves (as indicated in the following tables) were within normal limits.    Needle evaluation of the left extensor digitorum communis muscle showed increased insertional activity and slightly increased spontaneous activity.  The left triceps muscle showed increased spontaneous activity and diminished recruitment.  All remaining muscles (as indicated in the following table) showed no evidence of electrical instability.    Impression: The above electrodiagnostic study is ABNORMAL and reveals evidence of moderate chronic C7 radiculopathy on the left.    There is no significant electrodiagnostic evidence of any other focal nerve entrapment, brachial plexopathy or generalized peripheral neuropathy.   Recommendations: 1.  Follow-up with referring physician. 2.  Suggest Spine/Neurosurgical evaluation.  Nerve Conduction Studies Anti Sensory Summary Table   Stim Site NR Peak (ms) Norm Peak (ms) P-T Amp (V) Norm P-T Amp Site1 Site2 Delta-P (ms) Dist (cm) Vel (m/s) Norm Vel (m/s)  Left Median Acr Palm Anti Sensory (2nd Digit)  32.1C  Wrist    3.0 <3.6 26.2 >10 Wrist Palm 1.3 0.0    Palm    1.7 <2.0 21.3         Left Radial Anti Sensory (Base 1st Digit)  31.6C  Wrist    *3.5 <3.1 9.0  Wrist Base 1st Digit 3.5 0.0    Site 2    3.5  4.2         Left Ulnar Anti Sensory (5th Digit)  32.3C  Wrist    3.7 <3.7 *3.7 >15.0 Wrist 5th Digit 3.7 14.0 38 >38   Motor Summary Table   Stim Site NR Onset (ms) Norm Onset (ms) O-P Amp (mV) Norm O-P Amp Site1 Site2 Delta-0 (ms) Dist (cm) Vel (m/s) Norm Vel (m/s)  Left Median Motor (Abd Poll Brev)  31.8C  Wrist    3.4 <4.2 8.4 >5 Elbow Wrist 4.1 22.3 54 >50  Elbow    7.5  8.5          Left Ulnar Motor (Abd Dig Min)  31.8C  Wrist    3.5 <4.2 6.0 >3 B Elbow Wrist 4.3 21.0 *49 >50  B Elbow    7.8  10.4  A Elbow B Elbow 2.0 10.0 50 >50  A Elbow    9.8  10.9          EMG   Side Muscle Nerve Root Ins Act Fibs Psw Amp Dur Poly Recrt Int Dennie BiblePat Comment  Left 1stDorInt Ulnar C8-T1 Nml Nml Nml Nml Nml 0 Nml Nml   Left Abd Poll Brev Median C8-T1 Nml Nml Nml Nml Nml 0 Nml Nml   Left ExtDigCom   *Incr *1+ *1+ Nml Nml 0 Nml Nml   Left Triceps Radial C6-7-8 Nml *3+ *3+ Nml Nml 0 *Reduced Nml   Left Deltoid Axillary C5-6 Nml Nml Nml Nml Nml 0 Nml Nml   Left BrachioRad Radial C5-6 Nml Nml Nml Nml Nml 0 Nml Nml     Nerve Conduction Studies Anti Sensory Left/Right Comparison   Stim Site L Lat (ms) R Lat (ms) L-R Lat (ms) L Amp (V) R Amp (V) L-R Amp (%) Site1 Site2 L Vel (m/s) R Vel (m/s) L-R Vel (m/s)  Median Acr Palm Anti Sensory (2nd Digit)  32.1C  Wrist 3.0  26.2   Wrist Palm     Palm 1.7   21.3         Radial Anti Sensory (Base 1st Digit)  31.6C  Wrist *3.5   9.0   Wrist Base 1st Digit     Site 2 3.5   4.2         Ulnar Anti Sensory (5th Digit)  32.3C  Wrist 3.7   *3.7   Wrist 5th Digit 38     Motor Left/Right Comparison   Stim Site L Lat (ms) R Lat (ms) L-R Lat (ms) L Amp (mV) R Amp (mV) L-R Amp (%) Site1 Site2 L Vel (m/s) R Vel (m/s) L-R Vel (m/s)  Median Motor (Abd Poll Brev)  31.8C  Wrist 3.4   8.4   Elbow Wrist 54    Elbow 7.5   8.5         Ulnar Motor (Abd Dig Min)  31.8C  Wrist 3.5   6.0   B Elbow Wrist *49    B Elbow 7.8   10.4   A Elbow B Elbow 50    A Elbow 9.8   10.9            Waveforms:

## 2017-08-10 ENCOUNTER — Encounter: Payer: Self-pay | Admitting: Family Medicine

## 2017-08-10 ENCOUNTER — Ambulatory Visit (INDEPENDENT_AMBULATORY_CARE_PROVIDER_SITE_OTHER): Payer: No Typology Code available for payment source | Admitting: Family Medicine

## 2017-08-10 ENCOUNTER — Other Ambulatory Visit (HOSPITAL_COMMUNITY)
Admission: RE | Admit: 2017-08-10 | Discharge: 2017-08-10 | Disposition: A | Discharge status: Home / Self Care | Payer: No Typology Code available for payment source | Source: Ambulatory Visit | Attending: Family Medicine | Admitting: Family Medicine

## 2017-08-10 ENCOUNTER — Ambulatory Visit (INDEPENDENT_AMBULATORY_CARE_PROVIDER_SITE_OTHER): Payer: Self-pay | Admitting: Orthopaedic Surgery

## 2017-08-10 VITALS — BP 138/90 | HR 76 | Temp 98.7°F | Resp 16 | Ht 71.0 in | Wt 239.0 lb

## 2017-08-10 DIAGNOSIS — Z711 Person with feared health complaint in whom no diagnosis is made: Secondary | ICD-10-CM | POA: Insufficient documentation

## 2017-08-10 DIAGNOSIS — M5412 Radiculopathy, cervical region: Secondary | ICD-10-CM

## 2017-08-10 DIAGNOSIS — Z0000 Encounter for general adult medical examination without abnormal findings: Secondary | ICD-10-CM

## 2017-08-10 DIAGNOSIS — R6882 Decreased libido: Secondary | ICD-10-CM

## 2017-08-10 MED ORDER — OXYCODONE-ACETAMINOPHEN 5-325 MG PO TABS: 1.0000 | ORAL_TABLET | Freq: Every evening | ORAL | 0 refills | Status: DC | PRN

## 2017-08-10 NOTE — Patient Instructions (Addendum)
Preventing Sexually Transmitted Infections, Adult Sexually transmitted infections (STIs) are diseases that are passed (transmitted) from person to person through bodily fluids exchanged during sex or sexual contact. Bodily fluids include saliva, semen, blood, vaginal mucus, and urine. You may have an increased risk for developing an STI if you have unprotected oral, vaginal, or anal sex. Some common STIs include:  Herpes.  Hepatitis B.  Chlamydia.  Gonorrhea.  Syphilis.  HPV (human papillomavirus).  HIV (humanimmunodeficiency virus), the virus that can cause AIDS (acquired immunodeficiency virus).  How can I protect myself from sexually transmitted infections? The only way to completely prevent STIs is not to have sex of any kind (practice abstinence). This includes oral, vaginal, or anal sex. If you are sexually active, take these actions to lower your risk of getting an STI:  Have only one sex partner (be monogamous) or limit the number of sexual partners you have.  Stay up-to-date on immunizations. Certain vaccines can lower your risk of getting certain STIs, such as: ? Hepatitis A and B vaccines. You may have been vaccinated as a young child, but likely need a booster shot as a teen or young adult. ? HPV vaccine. This vaccine is recommended if you are a man under age 41 or a woman under age 41.  Use methods that prevent the exchange of body fluids between partners (barrier protection) every time you have sex. Barrier protection can be used during oral, vaginal, or anal sex. Commonly used barrier methods include: ? Male condom. ? Male condom. ? Dental dam.  Get tested regularly for STIs. Have your sexual partner get tested regularly as well.  Avoid mixing alcohol, drugs, and sex. Alcohol and drug use can affect your ability to make good decisions and can lead to risky sexual behaviors.  Ask your health care provider about taking pre-exposure prophylaxis (PrEP) to prevent  HIV infection if you: ? Have a HIV-positive sexual partner. ? Have multiple sexual partners or partners who do not know their HIV status, and do not regularly use a condom during sex. ? Use injection drugs and share needles.  Birth control pills, injections, implants, and intrauterine devices (IUDs) do not protect against STIs. To prevent both STIs and pregnancy, always use a condom with another form of birth control. Some STIs, such as herpes, are spread through skin to skin contact. A condom does not protect you from getting such STIs. If you or your partner have herpes and there is an active flare with open sores, avoid all sexual contact. Why are these changes important? Taking steps to practice safe sex protects you and others. Many STIs can be cured. However, some STIs are not curable and will affect you for the rest of your life. STIs can be passed on to another person even if you do not have symptoms. What can happen if changes are not made? Certain STIs may:  Require you to take medicine for the rest of your life.  Affect your ability to have children (your fertility).  Increase your risk for developing another STI or certain serious health conditions, such as: ? Cervical cancer. ? Head and neck cancer. ? Pelvic inflammatory disease (PID) in women. ? Organ damage or damage to other parts of your body, if the infection spreads.  Be passed to a baby during childbirth.  How are sexually transmitted infections treated? If you or your partner know or think that you may have an STI:  Talk with your healthcare provider about what can  be done to treat it. Some STIs can be treated and cured with medicines.  For curable STIs, you and your partner should avoid sex during treatment and for several days after treatment is complete.  You and your partner should both be treated at the same time, if there is any chance that your partner is infected as well. If you get treatment but your  partner does not, your partner can re-infect you when you resume sexual contact.  Do not have unprotected sex.  Where to find more information: Learn more about sexually transmitted diseases and infections from:  Centers for Disease Control and Prevention: ? More information about specific STIs: SolutionApps.co.za ? Find places to get sexual health counseling and treatment for free or for a low cost: gettested.TonerPromos.no  U.S. Department of Health and Human Services: NotebookPreviews.si.html  Summary  The only way to completely prevent STIs is not to have sex (practice abstinence), including oral, vaginal, or anal sex.  STIs can spread through saliva, semen, blood, vaginal mucus, urine, or sexual contact.  If you do have sex, limit your number of sexual partners and use a barrier protection method every time you have sex.  If you develop an STI, get treated right away and ask your partner to be treated as well. Do not resume having sex until both of you have completed treatment for the STI. This information is not intended to replace advice given to you by your health care provider. Make sure you discuss any questions you have with your health care provider. Document Released: 09/14/2016 Document Revised: 09/14/2016 Document Reviewed: 09/14/2016 Elsevier Interactive Patient Education  2018 [employer].  Heart-Healthy Eating Plan Heart-healthy meal planning includes:  Limiting unhealthy fats.  Increasing healthy fats.  Making other small dietary changes.  You may need to talk with your doctor or a diet specialist (dietitian) to create an eating plan that is right for you. What types of fat should I choose?  Choose healthy fats. These include olive oil and canola oil, flaxseeds, walnuts, almonds, and seeds.  Eat more omega-3 fats. These include salmon, mackerel, sardines, tuna, flaxseed oil, and ground  flaxseeds. Try to eat fish at least twice each week.  Limit saturated fats. ? Saturated fats are often found in animal products, such as meats, butter, and cream. ? Plant sources of saturated fats include palm oil, palm kernel oil, and coconut oil.  Avoid foods with partially hydrogenated oils in them. These include stick margarine, some tub margarines, cookies, crackers, and other baked goods. These contain trans fats. What general guidelines do I need to follow?  Check food labels carefully. Identify foods with trans fats or high amounts of saturated fat.  Fill one half of your plate with vegetables and green salads. Eat 4-5 servings of vegetables per day. A serving of vegetables is: ? 1 cup of raw leafy vegetables. ?  cup of raw or cooked cut-up vegetables. ?  cup of vegetable juice.  Fill one fourth of your plate with whole grains. Look for the word "whole" as the first word in the ingredient list.  Fill one fourth of your plate with lean protein foods.  Eat 4-5 servings of fruit per day. A serving of fruit is: ? One medium whole fruit. ?  cup of dried fruit. ?  cup of fresh, frozen, or canned fruit. ?  cup of 100% fruit juice.  Eat more foods that contain soluble fiber. These include apples, broccoli, carrots, beans, peas, and barley. Try to  get 20-30 g of fiber per day.  Eat more home-cooked food. Eat less restaurant, buffet, and fast food.  Limit or avoid alcohol.  Limit foods high in starch and sugar.  Avoid fried foods.  Avoid frying your food. Try baking, boiling, grilling, or broiling it instead. You can also reduce fat by: ? Removing the skin from poultry. ? Removing all visible fats from meats. ? Skimming the fat off of stews, soups, and gravies before serving them. ? Steaming vegetables in water or broth.  Lose weight if you are overweight.  Eat 4-5 servings of nuts, legumes, and seeds per week: ? One serving of dried beans or legumes equals  cup after  being cooked. ? One serving of nuts equals 1 ounces. ? One serving of seeds equals  ounce or one tablespoon.  You may need to keep track of how much salt or sodium you eat. This is especially true if you have high blood pressure. Talk with your doctor or dietitian to get more information. What foods can I eat? Grains Breads, including [place], white, pita, wheat, raisin, rye, oatmeal, and [place]. Tortillas that are neither fried nor made with lard or trans fat. Low-fat rolls, including hotdog and hamburger buns and English muffins. Biscuits. Muffins. Waffles. Pancakes. Light popcorn. Whole-grain cereals. Flatbread. Melba toast. Pretzels. Breadsticks. Rusks. Low-fat snacks. Low-fat crackers, including oyster, saltine, matzo, graham, animal, and rye. Rice and pasta, including brown rice and pastas that are made with whole wheat. Vegetables All vegetables. Fruits All fruits, but limit coconut. Meats and Other Protein Sources Lean, well-trimmed beef, veal, pork, and lamb. Chicken and [place] without skin. All fish and shellfish. Wild duck, rabbit, pheasant, and venison. Egg whites or low-cholesterol egg substitutes. Dried beans, peas, lentils, and tofu. Seeds and most nuts. Dairy Low-fat or nonfat cheeses, including ricotta, string, and mozzarella. Skim or 1% milk that is liquid, powdered, or evaporated. Buttermilk that is made with low-fat milk. Nonfat or low-fat yogurt. Beverages Mineral water. Diet carbonated beverages. Sweets and Desserts Sherbets and fruit ices. Honey, jam, marmalade, jelly, and syrups. Meringues and gelatins. Pure sugar candy, such as hard candy, jelly beans, gumdrops, mints, marshmallows, and small amounts of dark chocolate. MGM MIR[employer]el food cake. Eat all sweets and desserts in moderation. Fats and Oils Nonhydrogenated (trans-free) margarines. Vegetable oils, including soybean, sesame, sunflower, olive, peanut, safflower, corn, canola, and cottonseed. Salad dressings or  mayonnaise made with a vegetable oil. Limit added fats and oils that you use for cooking, baking, salads, and as spreads. Other Cocoa powder. Coffee and tea. All seasonings and condiments. The items listed above may not be a complete list of recommended foods or beverages. Contact your dietitian for more options. What foods are not recommended? Grains Breads that are made with saturated or trans fats, oils, or whole milk. Croissants. Butter rolls. Cheese breads. Sweet rolls. Donuts. Buttered popcorn. Chow mein noodles. High-fat crackers, such as cheese or butter crackers. Meats and Other Protein Sources Fatty meats, such as hotdogs, short ribs, sausage, spareribs, bacon, rib eye roast or steak, and mutton. High-fat deli meats, such as salami and bologna. Caviar. Domestic duck and goose. Organ meats, such as kidney, liver, sweetbreads, and heart. Dairy Cream, sour cream, cream cheese, and creamed cottage cheese. Whole-milk cheeses, including blue (bleu), [address] Jack, [place]rie, [place], [address], [place], [address], cheddar, [place]embert, and [place]. Whole or 2% milk that is liquid, evaporated, or condensed. Whole buttermilk. Cream sauce or high-fat cheese sauce. Yogurt that is made from whole milk. Beverages Regular sodas  and juice drinks with added sugar. Sweets and Desserts Frosting. Pudding. Cookies. Cakes other than angel food cake. Candy that has milk chocolate or white chocolate, hydrogenated fat, butter, coconut, or unknown ingredients. Buttered syrups. Full-fat ice cream or ice cream drinks. Fats and Oils Gravy that has suet, meat fat, or shortening. Cocoa butter, hydrogenated oils, palm oil, coconut oil, palm kernel oil. These can often be found in baked products, candy, fried foods, nondairy creamers, and whipped toppings. Solid fats and shortenings, including bacon fat, salt pork, lard, and butter. Nondairy cream substitutes, such as coffee creamers and sour cream substitutes. Salad dressings that  are made of unknown oils, cheese, or sour cream. The items listed above may not be a complete list of foods and beverages to avoid. Contact your dietitian for more information. This information is not intended to replace advice given to you by your health care provider. Make sure you discuss any questions you have with your health care provider. Document Released: 03/19/2012 Document Revised: 02/24/2016 Document Reviewed: 03/12/2014 Elsevier Interactive Patient Education  [employer].

## 2017-08-10 NOTE — Progress Notes (Signed)
Vincent Davis, is a 41 y.o. male  NUU:[id number]  HKV:[id number]  DOB - [birth year]  CC:  Chief Complaint  Patient presents with  . Follow-up    1 MONTH     HPI: Vincent Davis is a 41 y.o. male is here today for complete annual physical. Medical history is only significant for cervical radiculopathy. Family history only significant for mother with hypertension. Chang denies any prior surgical history.  He is a light occasional smoker and light social drinker. Denies use of elicit drugs. He is sexually active with females and reports that his "intimacy level" has been diminished since he has experienced worsening of neck and arm pain. Omarr requests STI testing today. Due to cervical radiculopathy, he reports no engagement in exercise and has recently experienced weight gain secondary to being sedentary. Patient has No headache, No chest pain, No abdominal pain - No Nausea, No new weakness tingling or numbness, No Cough - SOB.  Social History   Socioeconomic History  . Marital status: Single    Spouse name: Not on file  . Number of children: Not on file  . Years of education: Not on file  . Highest education level: Not on file  Social Needs  . Financial resource strain: Not on file  . Food insecurity - worry: Not on file  . Food insecurity - inability: Not on file  . Transportation needs - medical: Not on file  . Transportation needs - non-medical: Not on file  Occupational History  . Not on file  Tobacco Use  . Smoking status: Light Tobacco Smoker  . Smokeless tobacco: Never Used  Substance and Sexual Activity  . Alcohol use: Yes  . Drug use: No  . Sexual activity: Not on file  Other Topics Concern  . Not on file  Social History Narrative  . Not on file   No Known Allergies No past medical history on file. Current Outpatient Medications on File Prior to Visit  Medication Sig Dispense Refill  . acetaminophen (TYLENOL) 500 MG tablet Take 1 tablet (500 mg total) by mouth  every 6 (six) hours as needed. 30 tablet 0  . cyclobenzaprine (FLEXERIL) 10 MG tablet Take 1 tablet (10 mg total) by mouth 2 (two) times daily as needed for muscle spasms. 20 tablet 0  . gabapentin (NEURONTIN) 300 MG capsule Take 2 capsules (600 mg total) by mouth 3 (three) times daily. Make take an additional dose if needed for nerve pain. 90 capsule 0  . traMADol (ULTRAM) 50 MG tablet Take 1 tablet (50 mg total) by mouth every 6 (six) hours as needed. 15 tablet 0  . ciprofloxacin (CILOXAN) 0.3 % ophthalmic solution Place 1 drop into the right eye every 2 (two) hours. Administer 1 drop, every 2 hours, while awake, for 2 days. Then 1 drop, every 4 hours, while awake, for the next 5 days. (Patient not taking: Reported on 05/03/2017) 5 mL 0  . HYDROcodone-acetaminophen (NORCO/VICODIN) 5-325 MG tablet Take 1 tablet by mouth daily as needed for moderate pain. (Patient not taking: Reported on 08/10/2017) 20 tablet 0  . ketorolac (ACULAR) 0.5 % ophthalmic solution Place 1 drop into both eyes every 6 (six) hours. (Patient not taking: Reported on 05/03/2017) 5 mL 0  . methocarbamol (ROBAXIN) 500 MG tablet Take 1 tablet (500 mg total) by mouth 2 (two) times daily. (Patient not taking: Reported on 05/03/2017) 20 tablet 0  . naproxen (NAPROSYN) 500 MG tablet Take 1 tablet (500 mg total) by mouth 2 (two)  times daily. (Patient not taking: Reported on 05/03/2017) 20 tablet 0  . oxyCODONE-acetaminophen (PERCOCET) 5-325 MG tablet Take 1 tablet by mouth 2 (two) times daily as needed for severe pain. (Patient not taking: Reported on 08/10/2017) 20 tablet 0   No current facility-administered medications on file prior to visit.    Family History  Family history unknown: Yes   Social History   Socioeconomic History  . Marital status: Single    Spouse name: Not on file  . Number of children: Not on file  . Years of education: Not on file  . Highest education level: Not on file  Social Needs  . Financial resource strain:  Not on file  . Food insecurity - worry: Not on file  . Food insecurity - inability: Not on file  . Transportation needs - medical: Not on file  . Transportation needs - non-medical: Not on file  Occupational History  . Not on file  Tobacco Use  . Smoking status: Light Tobacco Smoker  . Smokeless tobacco: Never Used  Substance and Sexual Activity  . Alcohol use: Yes  . Drug use: No  . Sexual activity: Not on file  Other Topics Concern  . Not on file  Social History Narrative  . Not on file    Review of Systems: Constitutional: Negative for fever, chills, diaphoresis, activity change, appetite change and fatigue. HENT: Negative for ear pain, nosebleeds, congestion, facial swelling, rhinorrhea, neck pain, neck stiffness and ear discharge.  Eyes: Negative for pain, discharge, redness, itching and visual disturbance. Respiratory: Negative for cough, choking, chest tightness, shortness of breath, wheezing and stridor.  Cardiovascular: Negative for chest pain, palpitations and leg swelling. Gastrointestinal: Negative for abdominal distention. Genitourinary: Negative for dysuria, urgency, frequency, hematuria, flank pain, decreased urine volume, difficulty urinating and dyspareunia.  Musculoskeletal: Negative for back pain, joint swelling, arthralgia and gait problem. Neurological: Negative for dizziness, tremors, seizures, syncope, facial asymmetry, speech difficulty, weakness, light-headedness, numbness and headaches.  Hematological: Negative for adenopathy. Does not bruise/bleed easily. Psychiatric/Behavioral: Negative for hallucinations, behavioral problems, confusion, dysphoric mood, decreased concentration and agitation.    Objective:   Vitals:   08/10/17 1033  BP: 138/90  Pulse: 76  Resp: 16  Temp: 98.7 F (37.1 C)  SpO2: 100%    Physical Exam: Constitutional: Patient appears well-developed and well-nourished. No distress. HENT: Normocephalic, atraumatic, External right  and left ear normal. Oropharynx is clear and moist.  Eyes: Conjunctivae and EOM are normal. PERRLA, no scleral icterus. Neck: Normal ROM. Neck supple. No JVD. No tracheal deviation. No thyromegaly. CVS: RRR, S1/S2 +, no murmurs, no gallops, no carotid bruit.  Pulmonary: Effort and breath sounds normal, no stridor, rhonchi, wheezes, rales.  Abdominal: Soft. BS +, no distension, tenderness, rebound or guarding.  Musculoskeletal: Normal range of motion. No edema and no tenderness.  Lymphadenopathy: No lymphadenopathy noted, cervical, inguinal or axillary Neuro: Alert. Normal reflexes, muscle tone coordination. No cranial nerve deficit. Skin: Skin is warm and dry. No rash noted. Not diaphoretic. No erythema. No pallor. Psychiatric: Normal mood and affect. Behavior, judgment, thought content normal.  Lab Results  Component Value Date   WBC 10.6 (H) 01/22/2017   HGB 15.0 01/22/2017   HCT 45.2 01/22/2017   MCV 88.8 01/22/2017   PLT 326 01/22/2017   Lab Results  Component Value Date   CREATININE 1.18 01/22/2017   BUN 8 01/22/2017   NA 141 01/22/2017   K 3.7 01/22/2017   CL 104 01/22/2017   CO2 28 01/22/2017  Lab Results  Component Value Date   HGBA1C 5.1 05/03/2017   Lipid Panel  No results found for: CHOL, TRIG, HDL, CHOLHDL, VLDL, LDLCALC      Assessment and plan:  1. Annual physical exam Age-appropriate anticipatory guidance provided.  2. Concern about STD in male without diagnosis Encourage the use of barrier protection with each sexual encounter.  Will screen today for HIV, syphilis, chlamydia, and  Gonorrhea.  3. Low libido, recommended checking a testosterone level.  However patient declined as he is uninsured and will have to pay for labs out-of-pocket.  He also inquired about taking sildenafil.  Advised that a proper workup would have to be completed in order to determine underlying cause for the low libido prior to prescribing any erectile dysfunction  medications.  Orders Placed This Encounter  Procedures  . CBC with Differential  . COMPLETE METABOLIC PANEL WITH GFR  . Thyroid Panel With TSH  . HIV antibody (with reflex)  . RPR  . COMPLETE METABOLIC PANEL WITH GFR  . CBC with Differential/Platelet  . EKG 12-Lead    Return in about 6 months (around 02/07/2018), or 11/27/201-Fasting lab lipid panel .  The patient was given clear instructions to go to ER or return to medical center if symptoms don't improve, worsen or new problems develop. The patient verbalized understanding. The patient was told to call to get lab results if they haven't heard anything in the next week.     This note has been created with Education officer, environmentalDragon speech recognition software and smart phrase technology. Any transcriptional errors are unintentional.

## 2017-08-10 NOTE — Progress Notes (Signed)
   Office Visit Note   Patient: Vincent Davis           Date of Birth: 07/13/1976           MRN: [id number] Visit Date: 08/10/2017              Requested by: Bing NeighborsHarris, Kimberly S, FNP [address] [place], [place] [id number] PCP: Bing NeighborsHarris, Kimberly S, FNP   Assessment & Plan: Visit Diagnoses:  1. Cervical radiculopathy     Plan: Impression is left C7 radiculopathy.  Negative for carpal tunnel syndrome.  Prescription for Percocet just at night.  Referral to Dr. Ophelia CharterYates for surgical evaluation.  Follow-Up Instructions: Return if symptoms worsen or fail to improve.   Orders:  No orders of the defined types were placed in this encounter.  Meds ordered this encounter  Medications  . oxyCODONE-acetaminophen (PERCOCET) 5-325 MG tablet    Sig: Take 1-2 tablets at bedtime as needed by mouth for severe pain.    Dispense:  20 tablet    Refill:  0      Procedures: No procedures performed   Clinical Data: No additional findings.   Subjective: No chief complaint on file.   Patient comes in today to review his nerve conduction studies.  It is negative for carpal tunnel syndrome but did show a severe left C7 radiculopathy    Review of Systems  Constitutional: Negative.   All other systems reviewed and are negative.    Objective: Vital Signs: There were no vitals taken for this visit.  Physical Exam  Constitutional: He is oriented to person, place, and time. He appears well-developed and well-nourished.  Pulmonary/Chest: Effort normal.  Abdominal: Soft.  Neurological: He is alert and oriented to person, place, and time.  Skin: Skin is warm.  Psychiatric: He has a normal mood and affect. His behavior is normal. Judgment and thought content normal.  Nursing note and vitals reviewed.   Ortho Exam Exam is stable. Specialty Comments:  No specialty comments available.  Imaging: No results found.   PMFS History: Patient Active Problem List   Diagnosis Date Noted  .  Cervical radiculopathy 06/25/2017  . Alcohol use 05/10/2017   No past medical history on file.  Family History  Family history unknown: Yes    No past surgical history on file. Social History   Occupational History  . Not on file  Tobacco Use  . Smoking status: Light Tobacco Smoker  . Smokeless tobacco: Never Used  Substance and Sexual Activity  . Alcohol use: Yes  . Drug use: No  . Sexual activity: Not on file

## 2017-08-13 LAB — CBC WITH DIFFERENTIAL/PLATELET
BASOS PCT: 0.7 %
Basophils Absolute: 74 cells/uL (ref 0–200)
Eosinophils Absolute: 148 cells/uL (ref 15–500)
Eosinophils Relative: 1.4 %
HEMATOCRIT: 43.1 % (ref 38.5–50.0)
HEMOGLOBIN: 14.1 g/dL (ref 13.2–17.1)
LYMPHS ABS: 2025 {cells}/uL (ref 850–3900)
MCH: 28.8 pg (ref 27.0–33.0)
MCHC: 32.7 g/dL (ref 32.0–36.0)
MCV: 88 fL (ref 80.0–100.0)
MPV: 10.4 fL (ref 7.5–12.5)
Monocytes Relative: 7.2 %
NEUTROS ABS: 7590 {cells}/uL (ref 1500–7800)
NEUTROS PCT: 71.6 %
Platelets: 309 10*3/uL (ref 140–400)
RBC: 4.9 10*6/uL (ref 4.20–5.80)
RDW: 11.4 % (ref 11.0–15.0)
Total Lymphocyte: 19.1 %
WBC: 10.6 10*3/uL (ref 3.8–10.8)
WBCMIX: 763 {cells}/uL (ref 200–950)

## 2017-08-13 LAB — COMPLETE METABOLIC PANEL WITH GFR
AG RATIO: 1.5 (calc) (ref 1.0–2.5)
ALKALINE PHOSPHATASE (APISO): 47 U/L (ref 40–115)
ALT: 18 U/L (ref 9–46)
AST: 14 U/L (ref 10–40)
Albumin: 4.3 g/dL (ref 3.6–5.1)
BUN: 9 mg/dL (ref 7–25)
CO2: 26 mmol/L (ref 20–32)
CREATININE: 1.3 mg/dL (ref 0.60–1.35)
Calcium: 9.4 mg/dL (ref 8.6–10.3)
Chloride: 102 mmol/L (ref 98–110)
GFR, Est African American: 79 mL/min/{1.73_m2} (ref >=60)
GFR, Est Non African American: 68 mL/min/{1.73_m2} (ref >=60)
GLOBULIN: 2.8 g/dL (ref 1.9–3.7)
Glucose, Bld: 184 mg/dL — ABNORMAL HIGH (ref 65–99)
Potassium: 4.5 mmol/L (ref 3.5–5.3)
Sodium: 138 mmol/L (ref 135–146)
Total Bilirubin: 0.4 mg/dL (ref 0.2–1.2)
Total Protein: 7.1 g/dL (ref 6.1–8.1)

## 2017-08-13 LAB — RPR: RPR Ser Ql: NONREACTIVE

## 2017-08-13 LAB — URINE CYTOLOGY ANCILLARY ONLY
Chlamydia: NEGATIVE
Neisseria Gonorrhea: NEGATIVE
TRICH (WINDOWPATH): NEGATIVE

## 2017-08-13 LAB — HIV ANTIBODY (ROUTINE TESTING W REFLEX): HIV 1&2 Ab, 4th Generation: NONREACTIVE

## 2017-08-15 ENCOUNTER — Encounter: Payer: Self-pay | Admitting: Family Medicine

## 2017-08-28 ENCOUNTER — Other Ambulatory Visit: Payer: No Typology Code available for payment source

## 2017-08-28 ENCOUNTER — Ambulatory Visit (INDEPENDENT_AMBULATORY_CARE_PROVIDER_SITE_OTHER): Payer: Self-pay | Admitting: Orthopaedic Surgery

## 2017-08-28 ENCOUNTER — Ambulatory Visit (INDEPENDENT_AMBULATORY_CARE_PROVIDER_SITE_OTHER): Payer: Self-pay

## 2017-08-28 ENCOUNTER — Encounter (INDEPENDENT_AMBULATORY_CARE_PROVIDER_SITE_OTHER): Payer: Self-pay | Admitting: Orthopaedic Surgery

## 2017-08-28 VITALS — BP 129/78 | HR 83 | Ht 71.0 in | Wt 237.0 lb

## 2017-08-28 DIAGNOSIS — M4722 Other spondylosis with radiculopathy, cervical region: Secondary | ICD-10-CM
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
## 2017-08-28 NOTE — Progress Notes (Signed)
xrn

## 2017-08-31 ENCOUNTER — Encounter (HOSPITAL_COMMUNITY): Payer: Self-pay | Admitting: *Deleted

## 2017-08-31 ENCOUNTER — Other Ambulatory Visit: Payer: Self-pay

## 2017-08-31 NOTE — Progress Notes (Signed)
Pt denies SOB, chest pain, and being under the care of a cardiologist.. Pt denies having a stress test, echo and cardiac cath. Pt denies having a chest x ray within the last year. Pt denies recent labs. Pt made aware to stop taking Stop taking Aspirin, vitamins, fish oil and herbal medications. Do not take any NSAIDs ie: Ibuprofen, Advil, Naproxen (Aleve), Motrin, BC and Goody Powder or any medication containing Aspirin. Pt verbalized understanding of all pre-op instructions.

## 2017-09-03 ENCOUNTER — Other Ambulatory Visit: Payer: Self-pay

## 2017-09-03 ENCOUNTER — Observation Stay (HOSPITAL_COMMUNITY): Payer: Self-pay

## 2017-09-03 ENCOUNTER — Encounter (HOSPITAL_COMMUNITY): Payer: Self-pay | Admitting: Anesthesiology

## 2017-09-03 ENCOUNTER — Ambulatory Visit (HOSPITAL_COMMUNITY): Payer: Self-pay | Admitting: Certified Registered Nurse Anesthetist

## 2017-09-03 ENCOUNTER — Ambulatory Visit (HOSPITAL_COMMUNITY): Payer: Self-pay

## 2017-09-03 ENCOUNTER — Encounter (HOSPITAL_COMMUNITY): Payer: Self-pay | Admitting: Certified Registered Nurse Anesthetist

## 2017-09-03 ENCOUNTER — Inpatient Hospital Stay (HOSPITAL_COMMUNITY)
Admission: RE | Admit: 2017-09-03 | Discharge: 2017-09-14 | DRG: 471 | Disposition: A | Discharge status: Rehabilitation Facility | Payer: Self-pay | Source: Ambulatory Visit | Attending: Internal Medicine | Admitting: Internal Medicine

## 2017-09-03 ENCOUNTER — Encounter (HOSPITAL_COMMUNITY)
Admission: RE | Disposition: A | Discharge status: Rehabilitation Facility | Payer: Self-pay | Source: Ambulatory Visit | Attending: Orthopaedic Surgery

## 2017-09-03 DIAGNOSIS — M96840 Postprocedural hematoma of a musculoskeletal structure following a musculoskeletal system procedure: Secondary | ICD-10-CM | POA: Diagnosis not present

## 2017-09-03 DIAGNOSIS — R1310 Dysphagia, unspecified: Secondary | ICD-10-CM | POA: Diagnosis not present

## 2017-09-03 DIAGNOSIS — Z4659 Encounter for fitting and adjustment of other gastrointestinal appliance and device: Secondary | ICD-10-CM

## 2017-09-03 DIAGNOSIS — Y838 Other surgical procedures as the cause of abnormal reaction of the patient, or of later complication, without mention of misadventure at the time of the procedure: Secondary | ICD-10-CM | POA: Diagnosis not present

## 2017-09-03 DIAGNOSIS — J9600 Acute respiratory failure, unspecified whether with hypoxia or hypercapnia: Secondary | ICD-10-CM

## 2017-09-03 DIAGNOSIS — M5412 Radiculopathy, cervical region: Secondary | ICD-10-CM

## 2017-09-03 DIAGNOSIS — I10 Essential (primary) hypertension: Secondary | ICD-10-CM | POA: Diagnosis not present

## 2017-09-03 DIAGNOSIS — D649 Anemia, unspecified: Secondary | ICD-10-CM | POA: Diagnosis not present

## 2017-09-03 DIAGNOSIS — J810 Acute pulmonary edema: Secondary | ICD-10-CM | POA: Diagnosis not present

## 2017-09-03 DIAGNOSIS — R739 Hyperglycemia, unspecified: Secondary | ICD-10-CM | POA: Diagnosis not present

## 2017-09-03 DIAGNOSIS — D72829 Elevated white blood cell count, unspecified: Secondary | ICD-10-CM

## 2017-09-03 DIAGNOSIS — Z419 Encounter for procedure for purposes other than remedying health state, unspecified: Secondary | ICD-10-CM

## 2017-09-03 DIAGNOSIS — M4802 Spinal stenosis, cervical region: Secondary | ICD-10-CM

## 2017-09-03 DIAGNOSIS — D72828 Other elevated white blood cell count: Secondary | ICD-10-CM | POA: Diagnosis not present

## 2017-09-03 DIAGNOSIS — Z9289 Personal history of other medical treatment: Secondary | ICD-10-CM

## 2017-09-03 DIAGNOSIS — J9690 Respiratory failure, unspecified, unspecified whether with hypoxia or hypercapnia: Secondary | ICD-10-CM

## 2017-09-03 DIAGNOSIS — Z781 Physical restraint status: Secondary | ICD-10-CM

## 2017-09-03 DIAGNOSIS — J989 Respiratory disorder, unspecified: Secondary | ICD-10-CM

## 2017-09-03 DIAGNOSIS — J9589 Other postprocedural complications and disorders of respiratory system, not elsewhere classified: Secondary | ICD-10-CM | POA: Diagnosis not present

## 2017-09-03 DIAGNOSIS — J9811 Atelectasis: Secondary | ICD-10-CM | POA: Diagnosis not present

## 2017-09-03 DIAGNOSIS — I468 Cardiac arrest due to other underlying condition: Secondary | ICD-10-CM | POA: Diagnosis not present

## 2017-09-03 DIAGNOSIS — F17210 Nicotine dependence, cigarettes, uncomplicated: Secondary | ICD-10-CM | POA: Diagnosis present

## 2017-09-03 DIAGNOSIS — T884XXA Failed or difficult intubation, initial encounter: Secondary | ICD-10-CM | POA: Diagnosis not present

## 2017-09-03 DIAGNOSIS — G992 Myelopathy in diseases classified elsewhere: Secondary | ICD-10-CM | POA: Diagnosis present

## 2017-09-03 DIAGNOSIS — Z01818 Encounter for other preprocedural examination: Secondary | ICD-10-CM

## 2017-09-03 DIAGNOSIS — J9601 Acute respiratory failure with hypoxia: Secondary | ICD-10-CM | POA: Diagnosis not present

## 2017-09-03 DIAGNOSIS — E876 Hypokalemia: Secondary | ICD-10-CM | POA: Diagnosis not present

## 2017-09-03 DIAGNOSIS — M4722 Other spondylosis with radiculopathy, cervical region: Principal | ICD-10-CM | POA: Diagnosis present

## 2017-09-03 DIAGNOSIS — Y92239 Unspecified place in hospital as the place of occurrence of the external cause: Secondary | ICD-10-CM | POA: Diagnosis not present

## 2017-09-03 DIAGNOSIS — T17908A Unspecified foreign body in respiratory tract, part unspecified causing other injury, initial encounter: Secondary | ICD-10-CM

## 2017-09-03 DIAGNOSIS — Z0189 Encounter for other specified special examinations: Secondary | ICD-10-CM

## 2017-09-03 DIAGNOSIS — N179 Acute kidney failure, unspecified: Secondary | ICD-10-CM | POA: Diagnosis not present

## 2017-09-03 DIAGNOSIS — R338 Other retention of urine: Secondary | ICD-10-CM | POA: Diagnosis not present

## 2017-09-03 DIAGNOSIS — J9602 Acute respiratory failure with hypercapnia: Secondary | ICD-10-CM | POA: Diagnosis not present

## 2017-09-03 HISTORY — DX: Adverse effect of unspecified anesthetic, initial encounter: T41.45XA

## 2017-09-03 HISTORY — PX: ANTERIOR CERVICAL DECOMP/DISCECTOMY FUSION: SHX1161

## 2017-09-03 HISTORY — DX: Obesity, unspecified: E66.9

## 2017-09-03 HISTORY — DX: Other spondylosis with radiculopathy, cervical region: M47.22

## 2017-09-03 HISTORY — DX: Obesity, class 1: E66.811

## 2017-09-03 HISTORY — DX: Other complications of anesthesia, initial encounter: T88.59XA

## 2017-09-03 HISTORY — DX: Pneumonia, unspecified organism: J18.9

## 2017-09-03 HISTORY — DX: Family history of other specified conditions: Z84.89

## 2017-09-03 LAB — BASIC METABOLIC PANEL
ANION GAP: 10 (ref 5–15)
ANION GAP: 24 — AB (ref 5–15)
BUN: 6 mg/dL (ref 6–20)
BUN: 9 mg/dL (ref 6–20)
CHLORIDE: 105 mmol/L (ref 101–111)
CHLORIDE: 95 mmol/L — AB (ref 101–111)
CO2: 24 mmol/L (ref 22–32)
CO2: 15 mmol/L — AB (ref 22–32)
Calcium: 9.1 mg/dL (ref 8.9–10.3)
Calcium: 9.4 mg/dL (ref 8.9–10.3)
Creatinine, Ser: 1.17 mg/dL (ref 0.61–1.24)
Creatinine, Ser: 1.98 mg/dL — ABNORMAL HIGH (ref 0.61–1.24)
GFR calc Af Amer: >60 mL/min (ref >60)
GFR calc Af Amer: 47 mL/min — ABNORMAL LOW (ref >60)
GFR calc non Af Amer: 40 mL/min — ABNORMAL LOW (ref >60)
GLUCOSE: 106 mg/dL — AB (ref 65–99)
GLUCOSE: 327 mg/dL — AB (ref 65–99)
POTASSIUM: 3.6 mmol/L (ref 3.5–5.1)
POTASSIUM: 5 mmol/L (ref 3.5–5.1)
Sodium: 139 mmol/L (ref 135–145)
Sodium: 134 mmol/L — ABNORMAL LOW (ref 135–145)

## 2017-09-03 LAB — COMPREHENSIVE METABOLIC PANEL
ALT: 33 U/L (ref 17–63)
ANION GAP: 28 — AB (ref 5–15)
AST: 65 U/L — ABNORMAL HIGH (ref 15–41)
Albumin: 3.6 g/dL (ref 3.5–5.0)
Alkaline Phosphatase: 44 U/L (ref 38–126)
BUN: 8 mg/dL (ref 6–20)
CHLORIDE: 95 mmol/L — AB (ref 101–111)
CO2: 14 mmol/L — ABNORMAL LOW (ref 22–32)
Calcium: 10.3 mg/dL (ref 8.9–10.3)
Creatinine, Ser: 2.02 mg/dL — ABNORMAL HIGH (ref 0.61–1.24)
GFR, EST AFRICAN AMERICAN: 45 mL/min — AB (ref >60)
GFR, EST NON AFRICAN AMERICAN: 39 mL/min — AB (ref >60)
Glucose, Bld: 417 mg/dL — ABNORMAL HIGH (ref 65–99)
POTASSIUM: 4.6 mmol/L (ref 3.5–5.1)
Sodium: 137 mmol/L (ref 135–145)
TOTAL PROTEIN: 6.8 g/dL (ref 6.5–8.1)
Total Bilirubin: 0.7 mg/dL (ref 0.3–1.2)

## 2017-09-03 LAB — PROTIME-INR
INR: 1.01
Prothrombin Time: 13.2 seconds (ref 11.4–15.2)

## 2017-09-03 LAB — POCT I-STAT 3, ART BLOOD GAS (G3+)
Acid-base deficit: 17 mmol/L — ABNORMAL HIGH (ref 0.0–2.0)
Bicarbonate: 14 mmol/L — ABNORMAL LOW (ref 20.0–28.0)
O2 Saturation: 99 %
PCO2 ART: 50.1 mmHg — AB (ref 32.0–48.0)
PH ART: 7.055 — AB (ref 7.350–7.450)
PO2 ART: 172 mmHg — AB (ref 83.0–108.0)
TCO2: 16 mmol/L — ABNORMAL LOW (ref 22–32)

## 2017-09-03 LAB — SURGICAL PCR SCREEN
MRSA, PCR: NEGATIVE
STAPHYLOCOCCUS AUREUS: NEGATIVE

## 2017-09-03 LAB — CBC
HCT: 45.8 % (ref 39.0–52.0)
HEMATOCRIT: 47.4 % (ref 39.0–52.0)
HEMOGLOBIN: 15.7 g/dL (ref 13.0–17.0)
Hemoglobin: 14.3 g/dL (ref 13.0–17.0)
MCH: 30.2 pg (ref 26.0–34.0)
MCH: 29.6 pg (ref 26.0–34.0)
MCHC: 33.1 g/dL (ref 30.0–36.0)
MCHC: 31.2 g/dL (ref 30.0–36.0)
MCV: 91.2 fL (ref 78.0–100.0)
MCV: 94.8 fL (ref 78.0–100.0)
PLATELETS: 271 10*3/uL (ref 150–400)
Platelets: 312 10*3/uL (ref 150–400)
RBC: 5.2 MIL/uL (ref 4.22–5.81)
RBC: 4.83 MIL/uL (ref 4.22–5.81)
RDW: 14.3 % (ref 11.5–15.5)
RDW: 14.1 % (ref 11.5–15.5)
WBC: 12.4 10*3/uL — AB (ref 4.0–10.5)
WBC: 24.2 10*3/uL — ABNORMAL HIGH (ref 4.0–10.5)

## 2017-09-03 LAB — TRIGLYCERIDES: TRIGLYCERIDES: 87 mg/dL (ref <150)

## 2017-09-03 LAB — GLUCOSE, CAPILLARY: Glucose-Capillary: 255 mg/dL — ABNORMAL HIGH (ref 65–99)

## 2017-09-03 LAB — TYPE AND SCREEN
ABO/RH(D): B POS
ANTIBODY SCREEN: NEGATIVE

## 2017-09-03 LAB — ABO/RH: ABO/RH(D): B POS

## 2017-09-03 LAB — TROPONIN I: TROPONIN I: 0.05 ng/mL — AB (ref <0.03)

## 2017-09-03 LAB — LACTIC ACID, PLASMA: Lactic Acid, Venous: 9.6 mmol/L (ref 0.5–1.9)

## 2017-09-03 SURGERY — ANTERIOR CERVICAL DECOMPRESSION/DISCECTOMY FUSION 1 LEVEL
Anesthesia: General

## 2017-09-03 MED ORDER — FENTANYL CITRATE (PF) 100 MCG/2ML IJ SOLN: 50.0000 ug | Freq: Once | INTRAMUSCULAR | Status: DC

## 2017-09-03 MED ORDER — HYDROMORPHONE HCL 1 MG/ML IJ SOLN
0.5000 mg | INTRAMUSCULAR | Status: DC | PRN
  Administered 2017-09-03: 0.5 mg via INTRAVENOUS
  Filled 2017-09-03: qty 0.5

## 2017-09-03 MED ORDER — LIDOCAINE 2% (20 MG/ML) 5 ML SYRINGE
INTRAMUSCULAR | Status: AC
  Filled 2017-09-03: qty 5

## 2017-09-03 MED ORDER — DEXAMETHASONE SODIUM PHOSPHATE 10 MG/ML IJ SOLN
INTRAMUSCULAR | Status: AC
  Filled 2017-09-03: qty 1

## 2017-09-03 MED ORDER — POLYETHYLENE GLYCOL 3350 17 G PO PACK
17.0000 g | PACK | Freq: Every day | ORAL | Status: DC
  Administered 2017-09-04 – 2017-09-10 (×5): 17 g via ORAL
  Filled 2017-09-03 (×6): qty 1

## 2017-09-03 MED ORDER — FENTANYL BOLUS VIA INFUSION
50.0000 ug | INTRAVENOUS | Status: DC | PRN
  Filled 2017-09-03: qty 50

## 2017-09-03 MED ORDER — PROMETHAZINE HCL 25 MG/ML IJ SOLN
6.2500 mg | INTRAMUSCULAR | Status: DC | PRN
Start: 2017-09-03 — End: 2017-09-03

## 2017-09-03 MED ORDER — BUPIVACAINE-EPINEPHRINE 0.5% -1:200000 IJ SOLN
INTRAMUSCULAR | Status: DC | PRN
Start: 2017-09-03 — End: 2017-09-03
  Administered 2017-09-03: 6 mL

## 2017-09-03 MED ORDER — MIDAZOLAM HCL 2 MG/2ML IJ SOLN
2.0000 mg | Freq: Once | INTRAMUSCULAR | Status: AC
  Administered 2017-09-03: 2 mg via INTRAVENOUS

## 2017-09-03 MED ORDER — METHOCARBAMOL 1000 MG/10ML IJ SOLN
500.0000 mg | Freq: Four times a day (QID) | INTRAVENOUS | Status: DC | PRN
  Administered 2017-09-10: 500 mg via INTRAVENOUS
  Filled 2017-09-03 (×2): qty 5

## 2017-09-03 MED ORDER — HEMOSTATIC AGENTS (NO CHARGE) OPTIME
TOPICAL | Status: DC | PRN
  Administered 2017-09-03: 1 via TOPICAL

## 2017-09-03 MED ORDER — ACETAMINOPHEN 325 MG PO TABS
650.0000 mg | ORAL_TABLET | ORAL | Status: DC | PRN
  Administered 2017-09-14: 650 mg via ORAL
  Filled 2017-09-03: qty 2

## 2017-09-03 MED ORDER — SODIUM CHLORIDE 0.9 % IV SOLN
0.0000 ug/min | INTRAVENOUS | Status: DC
  Filled 2017-09-03 (×2): qty 1

## 2017-09-03 MED ORDER — FENTANYL CITRATE (PF) 250 MCG/5ML IJ SOLN
INTRAMUSCULAR | Status: AC
  Filled 2017-09-03: qty 5

## 2017-09-03 MED ORDER — NALOXONE HCL 0.4 MG/ML IJ SOLN
INTRAMUSCULAR | Status: AC
  Filled 2017-09-03: qty 1

## 2017-09-03 MED ORDER — METHOCARBAMOL 500 MG PO TABS
500.0000 mg | ORAL_TABLET | Freq: Four times a day (QID) | ORAL | Status: DC | PRN
  Administered 2017-09-03 – 2017-09-13 (×7): 500 mg via ORAL
  Filled 2017-09-03 (×9): qty 1

## 2017-09-03 MED ORDER — METHOCARBAMOL 500 MG PO TABS: 500.0000 mg | ORAL_TABLET | Freq: Four times a day (QID) | ORAL | 0 refills | Status: DC | PRN

## 2017-09-03 MED ORDER — DEXAMETHASONE SODIUM PHOSPHATE 10 MG/ML IJ SOLN
INTRAMUSCULAR | Status: DC | PRN
  Administered 2017-09-03: 10 mg via INTRAVENOUS

## 2017-09-03 MED ORDER — MIDAZOLAM BOLUS VIA INFUSION
1.0000 mg | INTRAVENOUS | Status: DC | PRN
  Filled 2017-09-03: qty 2

## 2017-09-03 MED ORDER — LACTATED RINGERS IV SOLN
INTRAVENOUS | Status: DC | PRN
  Administered 2017-09-03 (×2): via INTRAVENOUS

## 2017-09-03 MED ORDER — ONDANSETRON HCL 4 MG/2ML IJ SOLN
INTRAMUSCULAR | Status: AC
  Filled 2017-09-03: qty 2

## 2017-09-03 MED ORDER — FENTANYL CITRATE (PF) 100 MCG/2ML IJ SOLN
INTRAMUSCULAR | Status: AC
  Filled 2017-09-03: qty 2

## 2017-09-03 MED ORDER — ORAL CARE MOUTH RINSE
15.0000 mL | Freq: Four times a day (QID) | OROMUCOSAL | Status: DC
  Administered 2017-09-04 – 2017-09-08 (×17): 15 mL via OROMUCOSAL

## 2017-09-03 MED ORDER — EPHEDRINE 5 MG/ML INJ
INTRAVENOUS | Status: AC
  Filled 2017-09-03: qty 10

## 2017-09-03 MED ORDER — PROPOFOL 10 MG/ML IV BOLUS
INTRAVENOUS | Status: AC
  Filled 2017-09-03: qty 20

## 2017-09-03 MED ORDER — KETOROLAC TROMETHAMINE 30 MG/ML IJ SOLN
30.0000 mg | Freq: Four times a day (QID) | INTRAMUSCULAR | Status: DC
  Administered 2017-09-03: 30 mg via INTRAVENOUS
  Filled 2017-09-03: qty 1

## 2017-09-03 MED ORDER — PROPOFOL 1000 MG/100ML IV EMUL
INTRAVENOUS | Status: AC
  Administered 2017-09-03: 30 ug/kg/min
  Filled 2017-09-03: qty 100

## 2017-09-03 MED ORDER — SODIUM CHLORIDE 0.9 % IV SOLN
3.0000 g | Freq: Four times a day (QID) | INTRAVENOUS | Status: DC
  Administered 2017-09-04 – 2017-09-13 (×37): 3 g via INTRAVENOUS
  Filled 2017-09-03 (×40): qty 3

## 2017-09-03 MED ORDER — MIDAZOLAM HCL 2 MG/2ML IJ SOLN
2.0000 mg | Freq: Once | INTRAMUSCULAR | Status: AC
  Administered 2017-09-03: 2 mg via INTRAVENOUS
  Filled 2017-09-03: qty 2

## 2017-09-03 MED ORDER — SODIUM CHLORIDE 0.9 % IV SOLN
INTRAVENOUS | Status: DC
  Administered 2017-09-03 – 2017-09-09 (×8): via INTRAVENOUS

## 2017-09-03 MED ORDER — CHLORHEXIDINE GLUCONATE 0.12% ORAL RINSE (MEDLINE KIT)
15.0000 mL | Freq: Two times a day (BID) | OROMUCOSAL | Status: DC
  Administered 2017-09-04 – 2017-09-07 (×8): 15 mL via OROMUCOSAL

## 2017-09-03 MED ORDER — MIDAZOLAM HCL 2 MG/2ML IJ SOLN
INTRAMUSCULAR | Status: AC
  Filled 2017-09-03: qty 2

## 2017-09-03 MED ORDER — PROPOFOL 1000 MG/100ML IV EMUL
0.0000 ug/kg/min | INTRAVENOUS | Status: DC
  Administered 2017-09-04: 20 ug/kg/min via INTRAVENOUS
  Administered 2017-09-04: 30 ug/kg/min via INTRAVENOUS
  Administered 2017-09-04: 20 ug/kg/min via INTRAVENOUS
  Administered 2017-09-04 – 2017-09-05 (×2): 30 ug/kg/min via INTRAVENOUS
  Filled 2017-09-03 (×5): qty 100

## 2017-09-03 MED ORDER — HYDROMORPHONE HCL 1 MG/ML IJ SOLN: 0.2500 mg | INTRAMUSCULAR | Status: DC | PRN

## 2017-09-03 MED ORDER — FENTANYL CITRATE (PF) 100 MCG/2ML IJ SOLN
INTRAMUSCULAR | Status: DC | PRN
  Administered 2017-09-03: 50 ug via INTRAVENOUS
  Administered 2017-09-03: 150 ug via INTRAVENOUS
  Administered 2017-09-03 (×4): 50 ug via INTRAVENOUS

## 2017-09-03 MED ORDER — FENTANYL 2500MCG IN NS 250ML (10MCG/ML) PREMIX INFUSION
25.0000 ug/h | INTRAVENOUS | Status: DC
  Administered 2017-09-03: 150 ug/h via INTRAVENOUS
  Administered 2017-09-04 – 2017-09-06 (×3): 200 ug/h via INTRAVENOUS
  Administered 2017-09-06: 275 ug/h via INTRAVENOUS
  Administered 2017-09-07 (×4): 300 ug/h via INTRAVENOUS
  Administered 2017-09-08: 400 ug/h via INTRAVENOUS
  Administered 2017-09-08: 300 ug/h via INTRAVENOUS
  Administered 2017-09-08 – 2017-09-09 (×3): 400 ug/h via INTRAVENOUS
  Filled 2017-09-03 (×17): qty 250

## 2017-09-03 MED ORDER — CEFAZOLIN SODIUM-DEXTROSE 2-4 GM/100ML-% IV SOLN
2.0000 g | INTRAVENOUS | Status: AC
  Administered 2017-09-03: 2 g via INTRAVENOUS
  Filled 2017-09-03: qty 100

## 2017-09-03 MED ORDER — ONDANSETRON HCL 4 MG PO TABS: 4.0000 mg | ORAL_TABLET | Freq: Four times a day (QID) | ORAL | Status: DC | PRN

## 2017-09-03 MED ORDER — ONDANSETRON HCL 4 MG/2ML IJ SOLN
4.0000 mg | Freq: Four times a day (QID) | INTRAMUSCULAR | Status: DC | PRN
  Administered 2017-09-07: 4 mg via INTRAVENOUS
  Filled 2017-09-03: qty 2

## 2017-09-03 MED ORDER — SUGAMMADEX SODIUM 200 MG/2ML IV SOLN
INTRAVENOUS | Status: AC
  Filled 2017-09-03: qty 2

## 2017-09-03 MED ORDER — OXYCODONE HCL 5 MG PO TABS
5.0000 mg | ORAL_TABLET | ORAL | Status: DC | PRN
  Administered 2017-09-03: 10 mg via ORAL
  Filled 2017-09-03: qty 2

## 2017-09-03 MED ORDER — PHENOL 1.4 % MT LIQD
1.0000 | OROMUCOSAL | Status: DC | PRN
  Administered 2017-09-03: 1 via OROMUCOSAL
  Filled 2017-09-03: qty 177

## 2017-09-03 MED ORDER — THROMBIN (RECOMBINANT) 5000 UNITS EX SOLR
CUTANEOUS | Status: DC | PRN
  Administered 2017-09-03: 5000 [IU] via TOPICAL

## 2017-09-03 MED ORDER — SUCCINYLCHOLINE CHLORIDE 200 MG/10ML IV SOSY
PREFILLED_SYRINGE | INTRAVENOUS | Status: AC
  Filled 2017-09-03: qty 10

## 2017-09-03 MED ORDER — FUROSEMIDE 10 MG/ML IJ SOLN
20.0000 mg | Freq: Once | INTRAMUSCULAR | Status: AC
  Administered 2017-09-03: 20 mg via INTRAVENOUS

## 2017-09-03 MED ORDER — SUGAMMADEX SODIUM 200 MG/2ML IV SOLN
INTRAVENOUS | Status: DC | PRN
  Administered 2017-09-03: 200 mg via INTRAVENOUS

## 2017-09-03 MED ORDER — ROCURONIUM BROMIDE 100 MG/10ML IV SOLN
INTRAVENOUS | Status: DC | PRN
Start: 2017-09-03 — End: 2017-09-03
  Administered 2017-09-03: 10 mg via INTRAVENOUS
  Administered 2017-09-03: 60 mg via INTRAVENOUS

## 2017-09-03 MED ORDER — LIDOCAINE HCL (CARDIAC) 20 MG/ML IV SOLN
INTRAVENOUS | Status: DC | PRN
  Administered 2017-09-03: 100 mg via INTRAVENOUS

## 2017-09-03 MED ORDER — THROMBIN (RECOMBINANT) 5000 UNITS EX SOLR
CUTANEOUS | Status: AC
  Filled 2017-09-03: qty 5000

## 2017-09-03 MED ORDER — DOCUSATE SODIUM 100 MG PO CAPS
100.0000 mg | ORAL_CAPSULE | Freq: Two times a day (BID) | ORAL | Status: DC
  Filled 2017-09-03 (×2): qty 1

## 2017-09-03 MED ORDER — MENTHOL 3 MG MT LOZG: 1.0000 | LOZENGE | OROMUCOSAL | Status: DC | PRN

## 2017-09-03 MED ORDER — SODIUM CHLORIDE 0.9% FLUSH
3.0000 mL | INTRAVENOUS | Status: DC | PRN
  Administered 2017-09-09: 3 mL via INTRAVENOUS
  Filled 2017-09-03: qty 3

## 2017-09-03 MED ORDER — METHOCARBAMOL 500 MG PO TABS
ORAL_TABLET | ORAL | Status: AC
  Filled 2017-09-03: qty 1

## 2017-09-03 MED ORDER — FENTANYL CITRATE (PF) 100 MCG/2ML IJ SOLN
100.0000 ug | Freq: Once | INTRAMUSCULAR | Status: AC
  Administered 2017-09-03: 100 ug via INTRAVENOUS
  Filled 2017-09-03: qty 2

## 2017-09-03 MED ORDER — ROCURONIUM BROMIDE 10 MG/ML (PF) SYRINGE
PREFILLED_SYRINGE | INTRAVENOUS | Status: AC
  Filled 2017-09-03: qty 5

## 2017-09-03 MED ORDER — FAMOTIDINE IN NACL 20-0.9 MG/50ML-% IV SOLN
20.0000 mg | Freq: Two times a day (BID) | INTRAVENOUS | Status: DC
  Administered 2017-09-03 – 2017-09-14 (×22): 20 mg via INTRAVENOUS
  Filled 2017-09-03 (×22): qty 50

## 2017-09-03 MED ORDER — DEXMEDETOMIDINE HCL 200 MCG/2ML IV SOLN
INTRAVENOUS | Status: DC | PRN
  Administered 2017-09-03: 12 ug via INTRAVENOUS
  Administered 2017-09-03: 20 ug via INTRAVENOUS

## 2017-09-03 MED ORDER — FENTANYL CITRATE (PF) 100 MCG/2ML IJ SOLN
100.0000 ug | Freq: Once | INTRAMUSCULAR | Status: AC
  Administered 2017-09-03: 100 ug via INTRAVENOUS

## 2017-09-03 MED ORDER — OXYCODONE HCL 5 MG PO TABS
ORAL_TABLET | ORAL | Status: AC
  Filled 2017-09-03: qty 2

## 2017-09-03 MED ORDER — MIDAZOLAM HCL 5 MG/5ML IJ SOLN
INTRAMUSCULAR | Status: DC | PRN
  Administered 2017-09-03: 2 mg via INTRAVENOUS

## 2017-09-03 MED ORDER — MEPERIDINE HCL 25 MG/ML IJ SOLN: 6.2500 mg | INTRAMUSCULAR | Status: DC | PRN

## 2017-09-03 MED ORDER — PHENYLEPHRINE 40 MCG/ML (10ML) SYRINGE FOR IV PUSH (FOR BLOOD PRESSURE SUPPORT)
PREFILLED_SYRINGE | INTRAVENOUS | Status: AC
  Filled 2017-09-03: qty 10

## 2017-09-03 MED ORDER — ETOMIDATE 2 MG/ML IV SOLN
0.3000 mg/kg | Freq: Once | INTRAVENOUS | Status: AC
  Administered 2017-09-03: 32.26 mg via INTRAVENOUS

## 2017-09-03 MED ORDER — FUROSEMIDE 10 MG/ML IJ SOLN
INTRAMUSCULAR | Status: AC
Start: 2017-09-03 — End: 2017-09-03
  Filled 2017-09-03: qty 4

## 2017-09-03 MED ORDER — CHLORHEXIDINE GLUCONATE 4 % EX LIQD: 60.0000 mL | Freq: Once | CUTANEOUS | Status: DC

## 2017-09-03 MED ORDER — PROPOFOL 10 MG/ML IV BOLUS
INTRAVENOUS | Status: DC | PRN
  Administered 2017-09-03: 200 mg via INTRAVENOUS

## 2017-09-03 MED ORDER — ONDANSETRON HCL 4 MG/2ML IJ SOLN
INTRAMUSCULAR | Status: DC | PRN
  Administered 2017-09-03: 4 mg via INTRAVENOUS

## 2017-09-03 MED ORDER — SODIUM BICARBONATE 8.4 % IV SOLN
INTRAVENOUS | Status: AC
  Filled 2017-09-03: qty 50

## 2017-09-03 MED ORDER — DEXTROSE 5 % IV SOLN
0.0000 ug/min | INTRAVENOUS | Status: DC
  Filled 2017-09-03: qty 4

## 2017-09-03 MED ORDER — BUPIVACAINE-EPINEPHRINE (PF) 0.5% -1:200000 IJ SOLN
INTRAMUSCULAR | Status: AC
  Filled 2017-09-03: qty 30

## 2017-09-03 MED ORDER — OXYCODONE-ACETAMINOPHEN 7.5-325 MG PO TABS: 1.0000 | ORAL_TABLET | Freq: Four times a day (QID) | ORAL | 0 refills | Status: DC | PRN

## 2017-09-03 MED ORDER — FENTANYL BOLUS VIA INFUSION
200.0000 ug | Freq: Once | INTRAVENOUS | Status: AC
  Administered 2017-09-03: 200 ug via INTRAVENOUS
  Filled 2017-09-03: qty 200

## 2017-09-03 MED ORDER — SODIUM CHLORIDE 0.9% FLUSH
3.0000 mL | Freq: Two times a day (BID) | INTRAVENOUS | Status: DC
  Administered 2017-09-04 – 2017-09-14 (×19): 3 mL via INTRAVENOUS

## 2017-09-03 MED ORDER — 0.9 % SODIUM CHLORIDE (POUR BTL) OPTIME
TOPICAL | Status: DC | PRN
  Administered 2017-09-03: 1000 mL

## 2017-09-03 MED ORDER — ACETAMINOPHEN 650 MG RE SUPP
650.0000 mg | RECTAL | Status: DC | PRN
Start: 2017-09-03 — End: 2017-09-14

## 2017-09-03 MED ORDER — CEFAZOLIN SODIUM-DEXTROSE 1-4 GM/50ML-% IV SOLN
1.0000 g | Freq: Three times a day (TID) | INTRAVENOUS | Status: AC
  Administered 2017-09-03 – 2017-09-04 (×2): 1 g via INTRAVENOUS
  Filled 2017-09-03 (×4): qty 50

## 2017-09-03 MED ORDER — SODIUM CHLORIDE 0.9 % IV SOLN
0.0000 mg/h | INTRAVENOUS | Status: DC
  Filled 2017-09-03: qty 10

## 2017-09-03 SURGICAL SUPPLY — 57 items
APL SKNCLS STERI-STRIP NONHPOA (GAUZE/BANDAGES/DRESSINGS) ×1
BENZOIN TINCTURE PRP APPL 2/3 (GAUZE/BANDAGES/DRESSINGS) ×3 IMPLANT
BIT DRILL SRG 14X2.2XFLT CHK (BIT) IMPLANT
BIT DRL SRG 14X2.2XFLT CHK (BIT) ×1
BLADE CLIPPER SURG (BLADE) IMPLANT
BONE CERV LORDOTIC 14.5X12X7 (Bone Implant) ×3 IMPLANT
BUR ROUND FLUTED 4 SOFT TCH (BURR) ×1 IMPLANT
BUR ROUND FLUTED 4MM SOFT TCH (BURR) ×1
CLOSURE WOUND 1/2 X4 (GAUZE/BANDAGES/DRESSINGS) ×1
COLLAR CERV LO CONTOUR FIRM DE (SOFTGOODS) ×3 IMPLANT
COVER MAYO STAND STRL (DRAPES) ×3 IMPLANT
COVER SURGICAL LIGHT HANDLE (MISCELLANEOUS) ×3 IMPLANT
CRADLE DONUT ADULT HEAD (MISCELLANEOUS) ×3 IMPLANT
DRAPE C-ARM 42X72 X-RAY (DRAPES) ×5 IMPLANT
DRAPE HALF SHEET 40X57 (DRAPES) ×7 IMPLANT
DRAPE MICROSCOPE LEICA (MISCELLANEOUS) ×3 IMPLANT
DRILL BIT SKYLINE 14MM (BIT) ×3
DURAPREP 6ML APPLICATOR 50/CS (WOUND CARE) ×3 IMPLANT
ELECT COATED BLADE 2.86 ST (ELECTRODE) ×3 IMPLANT
ELECT REM PT RETURN 9FT ADLT (ELECTROSURGICAL) ×3
ELECTRODE REM PT RTRN 9FT ADLT (ELECTROSURGICAL) ×1 IMPLANT
EVACUATOR 1/8 PVC DRAIN (DRAIN) ×3 IMPLANT
GAUZE SPONGE 4X4 12PLY STRL (GAUZE/BANDAGES/DRESSINGS) ×3 IMPLANT
GLOVE BIOGEL PI IND STRL 8 (GLOVE) ×2 IMPLANT
GLOVE BIOGEL PI INDICATOR 8 (GLOVE) ×4
GLOVE ORTHO TXT STRL SZ7.5 (GLOVE) ×6 IMPLANT
GOWN STRL REUS W/ TWL LRG LVL3 (GOWN DISPOSABLE) ×1 IMPLANT
GOWN STRL REUS W/ TWL XL LVL3 (GOWN DISPOSABLE) ×1 IMPLANT
GOWN STRL REUS W/TWL 2XL LVL3 (GOWN DISPOSABLE) ×3 IMPLANT
GOWN STRL REUS W/TWL LRG LVL3 (GOWN DISPOSABLE) ×3
GOWN STRL REUS W/TWL XL LVL3 (GOWN DISPOSABLE) ×3
GRAFT BNE SPCR VG2 14.5X12X7 (Bone Implant) IMPLANT
HEAD HALTER (SOFTGOODS) ×3 IMPLANT
KIT BASIN OR (CUSTOM PROCEDURE TRAY) ×3 IMPLANT
KIT ROOM TURNOVER OR (KITS) ×3 IMPLANT
MANIFOLD NEPTUNE II (INSTRUMENTS) ×3 IMPLANT
NDL 25GX 5/8IN NON SAFETY (NEEDLE) ×1 IMPLANT
NEEDLE 25GX 5/8IN NON SAFETY (NEEDLE) ×3 IMPLANT
NS IRRIG 1000ML POUR BTL (IV SOLUTION) ×3 IMPLANT
PACK ORTHO CERVICAL (CUSTOM PROCEDURE TRAY) ×3 IMPLANT
PAD ARMBOARD 7.5X6 YLW CONV (MISCELLANEOUS) ×6 IMPLANT
PATTIES SURGICAL .5 X.5 (GAUZE/BANDAGES/DRESSINGS) IMPLANT
PIN TEMP SKYLINE THREADED (PIN) ×2 IMPLANT
PLATE ONE LEVEL SKYLINE 14MM (Plate) IMPLANT
PLATE SKYLINE 12MM (Plate) ×2 IMPLANT
SCREW VAR SELF TAP SKYLINE 14M (Screw) ×8 IMPLANT
STRIP CLOSURE SKIN 1/2X4 (GAUZE/BANDAGES/DRESSINGS) ×2 IMPLANT
SURGIFLO W/THROMBIN 8M KIT (HEMOSTASIS) ×2 IMPLANT
SUT BONE WAX W31G (SUTURE) ×3 IMPLANT
SUT VIC AB 3-0 PS2 18 (SUTURE) ×3
SUT VIC AB 3-0 PS2 18XBRD (SUTURE) ×1 IMPLANT
SUT VIC AB 4-0 PS2 27 (SUTURE) ×3 IMPLANT
SYR BULB 3OZ (MISCELLANEOUS) ×3 IMPLANT
TAPE CLOTH SURG 4X10 WHT LF (GAUZE/BANDAGES/DRESSINGS) ×2 IMPLANT
TOWEL OR 17X24 6PK STRL BLUE (TOWEL DISPOSABLE) ×3 IMPLANT
TOWEL OR 17X26 10 PK STRL BLUE (TOWEL DISPOSABLE) ×3 IMPLANT
WATER STERILE IRR 1000ML POUR (IV SOLUTION) ×3 IMPLANT

## 2017-09-03 NOTE — Progress Notes (Signed)
[facility] to [facility] for Code Blue. CPR being administered. Patient being ventilated by RT, with ambubag. DL by Lynelle Smoke. Talar Fraley, CRNA, with Leeanne DeedMiller 2 and Hyacinth MeekerMiller 3 and unable to visualize cords. Mask ventilated between attempts. Grade 2 view with Glidescope. Subglottic ETT # 7.5 inserted, 24 cm at lip. Positive ETCO2 and equal bilateral breath sounds. ETT secured by RT and patient taken to Neuro ICU.  Toney Sangheresa Sherby Moncayo, CRNA

## 2017-09-03 NOTE — Code Documentation (Signed)
CODE BLUE NOTE  Patient Name: Vincent Davis   MRN: [id number]   Date of Birth/ Sex: 02/17/1976 , male      Admission Date: 09/03/2017  Attending Provider: Eldred MangesYates, Mark C, MD  Primary Diagnosis: <principal problem not specified>    Indication: Pt was in his usual state of health until this PM, when he was noted to be without a pulse in PEA. Code blue was subsequently called. At the time of arrival on scene, ACLS protocol was underway.  Patient received 1 epinephrine and 2 doses of Narcan.  Spontaneous normal sinus was noted. Admitting doctor was notified.  However patient then became unresponsive and pulseless again, soon after and was found to have PEA.  ACLS protocol was started again.   Technical Description:  - CPR performance duration:  20 minutes  - Was defibrillation or cardioversion used? No   - Was external pacer placed? No  - Was patient intubated pre/post CPR? Yes    Medications Administered: Y = Yes; Blank = No Amiodarone  N  Atropine  N  Calcium  Y  Epinephrine  Y  Lidocaine  Y  Magnesium  N  Norepinephrine  N  Phenylephrine  N  Sodium bicarbonate  Y  Vasopressin  N  Narcan: Y  Post CPR evaluation:  - Final Status - Was patient successfully resuscitated ? Yes - What is current rhythm?  Normal sinus - What is current hemodynamic status?  Transferred to ICU, stable   Miscellaneous Information:  - Labs sent, including:  CBC, CMP, troponin, ABG  - Primary team notified?  Yes  - Family Notified? Yes  - Additional notes/ transfer status:  CCM took over care in ICU.        Leeann Bady, [place], DO  09/03/2017, 09:51 PM

## 2017-09-03 NOTE — H&P (Signed)
Vincent Davis is an 41 y.o. male.   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  Progressively worsening symptoms.  Failed conservative treatment.    Past Medical History:  Diagnosis Date  . Cervical spondylosis with radiculopathy   . Family history of adverse reaction to anesthesia    "I don't know."  . Obesity (BMI 30.0-34.9)   . Pneumonia    as a child    Past Surgical History:  Procedure Laterality Date  . NO PAST SURGERIES      Family History  Family history unknown: Yes   Social History:  reports that he has been smoking cigarettes.  he has never used smokeless tobacco. He reports that he drinks alcohol. He reports that he uses drugs. Drug: Marijuana.  Allergies: No Known Allergies  No medications prior to admission.    No results found for this or any previous visit (from the past 48 hour(s)). No results found.  Review of Systems  Constitutional: Negative.   HENT: Negative.   Respiratory: Negative.   Cardiovascular: Negative.   Gastrointestinal: Negative.   Genitourinary: Negative.   Musculoskeletal: Positive for neck pain.  Neurological: Positive for tingling.  Psychiatric/Behavioral: Negative.     There were no vitals taken for this visit. Physical Exam  Constitutional: He is oriented to person, place, and time. He appears well-developed and well-nourished. No distress.  HENT:  Head: Normocephalic and atraumatic.  Eyes: EOM are normal. Pupils are equal, round, and reactive to light.  Respiratory: No respiratory distress.  GI: He exhibits no distension.  Musculoskeletal:  Ortho Exam positive Spurling on the left.  patient has trace left triceps 2+ on the right. Biceps and brachioradialis are normal. No interossei weakness. He has some weakness of finger extension as well as wrist flexion. No lower extremity hyperreflexia  normal heel toe gait no clonus.  Neurological: He is alert and oriented to person, place, and time.  Skin: Skin is warm and dry.  Psychiatric: He has a normal mood and affect.     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  Surgical procedure along with possible risks and complications.  All questions answered and wishes to proceed.   Zonia KiefJames Jeffifer Rabold, PA-C 09/03/2017, 6:14 AM

## 2017-09-03 NOTE — Progress Notes (Signed)
Subjective: Day of Surgery Procedure(s) (LRB): CERVICAL SIX-SEVEN  ANTERIOR CERVICAL DISCECTOMY AND FUSION, ALLOGRAFT, PLATE (N/A) Patient coded after having some respiratory problems and coughing up yellow phlegm. Girlfriend at bedside and got RN when pt had second ephisode and could not clear plegm. Coded, problems with tube placement due to tongue in way, some cord swelling. Post op he had continued tingling in his left arm but denied arm pain. He is a smoker . Once intubated BP increased to 230/150 , unable to suck out ET tube due to pt biting on tube. Sedated, fentanyl and now on sedation drip. propathol drip and fentanyl now. Patient fighting with all extremities , trying to climb out of bed, biting on tube. He had gotten percocet( which he was taking pre-op) and also robaxin. Had already gotten dilaudid 0.5 mg IV and I ordered toradol giving verbal order to nurse. He was spitting into the suction tubing, dressing changed by RN and neck was soft. I talked with nurse about 1 hr before respiratory arrest when toradol ordered and also again talked with pt by phone. Denied chest pain. Tonite he was  Coded several times until stabilized.    CXR now shows aspiration vs asymmetric pulmonary edema with atelectasis.    Objective: Vital signs in last 24 hours: Temp:  [97.8 F (36.6 C)-99 F (37.2 C)] 98.4 F (36.9 C) (12/03 2019) Pulse Rate:  [74-102] 102 (12/03 2019) Resp:  [15-20] 20 (12/03 2019) BP: (143-155)/(76-95) 148/92 (12/03 2019) SpO2:  [95 %-99 %] 97 % (12/03 2133) FiO2 (%):  [100 %] 100 % (12/03 2133) Weight:  [237 lb (107.5 kg)] 237 lb (107.5 kg) (12/03 1031)  Intake/Output from previous day: No intake/output data recorded. Intake/Output this shift: Total I/O In: -  Out: 200 [Other:200]  Recent Labs    09/03/17 1014 09/03/17 2115  HGB 15.7 14.3   Recent Labs    09/03/17 1014 09/03/17 2115  WBC 12.4* 24.2*  RBC 5.20 4.83  HCT 47.4 45.8  PLT 312 271   Recent Labs    09/03/17 1014  NA 139  K 3.6  CL 105  CO2 24  BUN 6  CREATININE 1.17  GLUCOSE 106*  CALCIUM 9.1   No results for input(s): LABPT, INR in the last 72 hours.  PE:  Pupils equal, not following commands. Moving all extremities. Neck soft, minimal hemovac drainage.  Dg Cervical Spine 1 View  Result Date: 09/03/2017 CLINICAL DATA:  41 year old male with a history of ACDF EXAM: CERVICAL SPINE 1 VIEW COMPARISON:  None. FINDINGS: Single cross-table lateral of the cervical spine, status post C6-C7 ACDF. Gas within the soft tissues of the surgical bed. Hardware incompletely imaged given the overlying soft tissues of the shoulder. Surgical drain present. IMPRESSION: Single cross-table lateral of the cervical spine, status post C6-C7 ACDF, with incomplete visualization of the hardware. Surgical drain present within the soft tissues. Electronically Signed   By: Gilmer MorJaime  Wagner D.O.   On: 09/03/2017 15:28   Dg Cervical Spine 2-3 Views  Result Date: 09/03/2017 CLINICAL DATA:  41 year old male for C6-7 fusion. Subsequent encounter. EXAM: DG C-ARM 61-120 MIN; CERVICAL SPINE - 2-3 VIEW COMPARISON:  08/28/2017 plain film exam.  07/13/2017 MR. Fluoroscopic time: 19 seconds. FINDINGS: Three C-arm views submitted for review. Anterior discectomy with plate and screws in place lower cervical spine. Lateral view does not include the dens and therefore not able to evaluate level properly. This can be assessed on follow-up. Frontal view suggestive of this representing  the C6-7 level. IMPRESSION: Fusion lower cervical spine possibly C6-7 level. Lateral view including the dens would prove helpful for further delineation. These results will be called to the ordering clinician or representative by the Radiologist Assistant, and communication documented in the PACS or zVision Dashboard. Electronically Signed   By: Lacy DuverneySteven  Olson M.D.   On: 09/03/2017 14:30   Dg C-arm 1-60 Min  Result Date: 09/03/2017 CLINICAL DATA:   41 year old male for C6-7 fusion. Subsequent encounter. EXAM: DG C-ARM 61-120 MIN; CERVICAL SPINE - 2-3 VIEW COMPARISON:  08/28/2017 plain film exam.  07/13/2017 MR. Fluoroscopic time: 19 seconds. FINDINGS: Three C-arm views submitted for review. Anterior discectomy with plate and screws in place lower cervical spine. Lateral view does not include the dens and therefore not able to evaluate level properly. This can be assessed on follow-up. Frontal view suggestive of this representing the C6-7 level. IMPRESSION: Fusion lower cervical spine possibly C6-7 level. Lateral view including the dens would prove helpful for further delineation. These results will be called to the ordering clinician or representative by the Radiologist Assistant, and communication documented in the PACS or zVision Dashboard. Electronically Signed   By: Lacy DuverneySteven  Olson M.D.   On: 09/03/2017 14:30    Assessment/Plan:    Day of Surgery Procedure(s) (LRB): CERVICAL SIX-SEVEN  ANTERIOR CERVICAL DISCECTOMY AND FUSION, ALLOGRAFT, PLATE (N/A)   Acute respiratory failure- post op Plan: appreciate code team response, in ICU on vent.    Intensivist providing care.   I dicussed with girlfriend and gave her my phone # so family can call me.  CXR shows patchy infiltrate.   My cell # I [phone] Eldred MangesMark C Yates 09/03/2017, 9:53 PM

## 2017-09-03 NOTE — Progress Notes (Signed)
Orthopedic Tech Progress Note Patient Details:  Vincent DyerSamuel Keith Davis 06/11/1976 [id number]  Ortho Devices Type of Ortho Device: Soft collar Ortho Device/Splint Location: neck Ortho Device/Splint Interventions: Freeman CaldronOrdered       Ohana Birdwell 09/03/2017, 4:45 PM

## 2017-09-03 NOTE — Progress Notes (Deleted)
Paged to 3C for a Code Blue that was already being initiated on my arrival. Patient was being manually ventilated 100% with BMV. Patient was intubated by CRNA requiring multiple attempts due to unable to visualize Vocal Cords. Glidescope use for intubation.  Positive Color Change noted. Patient being transferred to 4N26 for further management.    

## 2017-09-03 NOTE — Progress Notes (Signed)
P[facility] to [facility] for a Code Blue that was already being initiated on my arrival. Patient was being manually ventilated 100% with BMV. Patient was intubated by CRNA requiring multiple attempts due to unable to visualize Vocal Cords. Glidescope use for intubation.  Positive Color Change noted. Patient being transferred to 4N26 for further management.

## 2017-09-03 NOTE — Progress Notes (Signed)
Patient complained of tightness and difficulty swallowing with frequent coughing up phlegm. Emptied 200 ml from the emesis bag. Gauze dsg removed to check neck for swelling but none noted at this time. Ice pack applied and pain med given. Patient still complaining of discomfort and coughing up phlegm. Oral suction done with moderate amount noted. MD notified and ordered Toradol. Order carried out. Report given to oncoming nurse.

## 2017-09-03 NOTE — Plan of Care (Signed)
  Progressing Safety: Ability to remain free from injury will improve 09/03/2017 2020 - Progressing by Cephus RicherAguilar, Tia Gelb D, RN Activity: Ability to avoid complications of mobility impairment will improve 09/03/2017 2020 - Progressing by Mel AlmondAguilar, Jenne Sellinger D, RN Ability to tolerate increased activity will improve 09/03/2017 2020 - Progressing by Mel AlmondAguilar, Ciria Bernardini D, RN Will remain free from falls 09/03/2017 2020 - Progressing by Threasa BeardsAguilar, Ermel Verne D, RN Bowel/Gastric: Gastrointestinal status for postoperative course will improve 09/03/2017 2020 - Progressing by Mel AlmondAguilar, Candyce Gambino D, RN Education: Ability to verbalize activity precautions or restrictions will improve 09/03/2017 2020 - Progressing by Threasa BeardsAguilar, Hollis Tuller D, RN Knowledge of the prescribed therapeutic regimen will improve 09/03/2017 2020 - Progressing by Mel AlmondAguilar, Gorden Stthomas D, RN Understanding of discharge needs will improve 09/03/2017 2020 - Progressing by Mel AlmondAguilar, Talissa Apple D, RN Physical Regulation: Ability to maintain clinical measurements within normal limits will improve 09/03/2017 2020 - Progressing by Cephus RicherAguilar, Panagiota Perfetti D, RN Postoperative complications will be avoided or minimized 09/03/2017 2020 - Progressing by Mel AlmondAguilar, Aarion Kittrell D, RN Diagnostic test results will improve 09/03/2017 2020 - Progressing by Mel AlmondAguilar, Hyrum Shaneyfelt D, RN Pain Management: Pain level will decrease 09/03/2017 2020 - Progressing by Mel AlmondAguilar, Hunt Zajicek D, RN Skin Integrity: Signs of wound healing will improve 09/03/2017 2020 - Progressing by Mel AlmondAguilar, Aviyon Hocevar D, RN Health Behavior/Discharge Planning: Identification of resources available to assist in meeting health care needs will improve 09/03/2017 2020 - Progressing by Mel AlmondAguilar, Shanti Agresti D, RN Bladder/Genitourinary: Urinary functional status for postoperative course will improve 09/03/2017 2020 - Progressing by Mel AlmondAguilar, Ina Poupard D, RN

## 2017-09-03 NOTE — Consult Note (Signed)
PULMONARY / CRITICAL CARE MEDICINE   Name: Vincent Davis MRN: [id number] DOB: 03/30/1976    ADMISSION DATE:  09/03/2017 CONSULTATION DATE:  09/03/2017  REFERRING MD:  Dr. Carolyne [facility]  CHIEF COMPLAINT:  PEA arrest  HISTORY OF PRESENT ILLNESS:   41 year old male with history of cervical spondylosis with radiculopathy. He presented 12/3 for elective repair under Dr. Ophelia CharterYates. The surgery was without complication. In the postoperative setting he was doing well and recovering as expected with the exception of some respiratory secretions. Was conversant with nurse. She left him to get pain medication and upon returning a few minutes later she found him unresponsive and pulseless. PEA. He was coded off and on for 20 mins, but there was eventual return of spontaneous circulation that endured. He was transferred to ICU.   PAST MEDICAL HISTORY :  He  has a past medical history of Cervical spondylosis with radiculopathy, Complication of anesthesia, Family history of adverse reaction to anesthesia, Obesity (BMI 30.0-34.9), and Pneumonia.  PAST SURGICAL HISTORY: He  has a past surgical history that includes No past surgeries.  No Known Allergies  No current facility-administered medications on file prior to encounter.    Current Outpatient Medications on File Prior to Encounter  Medication Sig  . acetaminophen (TYLENOL) 500 MG tablet Take 1 tablet (500 mg total) by mouth every 6 (six) hours as needed. (Patient taking differently: Take 1,500 mg by mouth 2 (two) times daily as needed for moderate pain or headache. )  . HYDROcodone-acetaminophen (NORCO/VICODIN) 5-325 MG tablet Take 1 tablet by mouth daily as needed for moderate pain.  Marland Kitchen. oxyCODONE-acetaminophen (PERCOCET) 5-325 MG tablet Take 1 tablet by mouth 2 (two) times daily as needed for severe pain.  . cyclobenzaprine (FLEXERIL) 10 MG tablet Take 1 tablet (10 mg total) by mouth 2 (two) times daily as needed for muscle spasms. (Patient not  taking: Reported on 08/30/2017)  . gabapentin (NEURONTIN) 300 MG capsule Take 2 capsules (600 mg total) by mouth 3 (three) times daily. Make take an additional dose if needed for nerve pain. (Patient not taking: Reported on 08/30/2017)  . ketorolac (ACULAR) 0.5 % ophthalmic solution Place 1 drop into both eyes every 6 (six) hours. (Patient not taking: Reported on 05/03/2017)  . methocarbamol (ROBAXIN) 500 MG tablet Take 1 tablet (500 mg total) by mouth 2 (two) times daily. (Patient not taking: Reported on 05/03/2017)  . naproxen (NAPROSYN) 500 MG tablet Take 1 tablet (500 mg total) by mouth 2 (two) times daily. (Patient not taking: Reported on 05/03/2017)  . oxyCODONE-acetaminophen (PERCOCET) 5-325 MG tablet Take 1-2 tablets at bedtime as needed by mouth for severe pain. (Patient not taking: Reported on 08/30/2017)  . traMADol (ULTRAM) 50 MG tablet Take 1 tablet (50 mg total) by mouth every 6 (six) hours as needed. (Patient not taking: Reported on 08/30/2017)    FAMILY HISTORY:  His indicated that his mother is alive. He indicated that his father is deceased.   SOCIAL HISTORY: He  reports that he has been smoking cigarettes.  he has never used smokeless tobacco. He reports that he drinks alcohol. He reports that he uses drugs. Drug: Marijuana.  REVIEW OF SYSTEMS:   Unable as patient is encephalopathic  SUBJECTIVE:    VITAL SIGNS: BP (!) 148/92 (BP Location: Left Arm)   Pulse (!) 102   Temp 98.4 F (36.9 C) (Oral)   Resp 20   Ht 5\' 11"  (1.803 m)   Wt 107.5 kg (237 lb)  SpO2 95%   BMI 33.05 kg/m   HEMODYNAMICS:    VENTILATOR SETTINGS:    INTAKE / OUTPUT: I/O last 3 completed shifts: In: 1600 [P.O.:600; I.V.:1000] Out: 100 [Blood:100]  PHYSICAL EXAMINATION: General:  Young black male on vent agitated Neuro:  Agitated on vent. Purposefully reaching for ETT HEENT:  [place]/AT, PERRL, no JVD. Contact lenses removed by me.  Cardiovascular:  RRR, no MRG Lungs:  Coarse  bilateral Abdomen:  Distended, soft, non-tender Musculoskeletal:  No acute deformity or ROM limitation Skin:  Grossly intact  LABS:  BMET Recent Labs  Lab 09/03/17 1014  NA 139  K 3.6  CL 105  CO2 24  BUN 6  CREATININE 1.17  GLUCOSE 106*    Electrolytes Recent Labs  Lab 09/03/17 1014  CALCIUM 9.1    CBC Recent Labs  Lab 09/03/17 1014  WBC 12.4*  HGB 15.7  HCT 47.4  PLT 312    Coag's No results for input(s): APTT, INR in the last 168 hours.  Sepsis Markers No results for input(s): LATICACIDVEN, PROCALCITON, O2SATVEN in the last 168 hours.  ABG No results for input(s): PHART, PCO2ART, PO2ART in the last 168 hours.  Liver Enzymes No results for input(s): AST, ALT, ALKPHOS, BILITOT, ALBUMIN in the last 168 hours.  Cardiac Enzymes No results for input(s): TROPONINI, PROBNP in the last 168 hours.  Glucose Recent Labs  Lab 09/03/17 2046  GLUCAP 255*    Imaging Dg Cervical Spine 1 View  Result Date: 09/03/2017 CLINICAL DATA:  41 year old male with a history of ACDF EXAM: CERVICAL SPINE 1 VIEW COMPARISON:  None. FINDINGS: Single cross-table lateral of the cervical spine, status post C6-C7 ACDF. Gas within the soft tissues of the surgical bed. Hardware incompletely imaged given the overlying soft tissues of the shoulder. Surgical drain present. IMPRESSION: Single cross-table lateral of the cervical spine, status post C6-C7 ACDF, with incomplete visualization of the hardware. Surgical drain present within the soft tissues. Electronically Signed   By: Gilmer MorJaime  Wagner D.O.   On: 09/03/2017 15:28   Dg Cervical Spine 2-3 Views  Result Date: 09/03/2017 CLINICAL DATA:  41 year old male for C6-7 fusion. Subsequent encounter. EXAM: DG C-ARM 61-120 MIN; CERVICAL SPINE - 2-3 VIEW COMPARISON:  08/28/2017 plain film exam.  07/13/2017 MR. Fluoroscopic time: 19 seconds. FINDINGS: Three C-arm views submitted for review. Anterior discectomy with plate and screws in place lower  cervical spine. Lateral view does not include the dens and therefore not able to evaluate level properly. This can be assessed on follow-up. Frontal view suggestive of this representing the C6-7 level. IMPRESSION: Fusion lower cervical spine possibly C6-7 level. Lateral view including the dens would prove helpful for further delineation. These results will be called to the ordering clinician or representative by the Radiologist Assistant, and communication documented in the PACS or zVision Dashboard. Electronically Signed   By: Lacy DuverneySteven  Olson M.D.   On: 09/03/2017 14:30   Dg C-arm 1-60 Min  Result Date: 09/03/2017 CLINICAL DATA:  41 year old male for C6-7 fusion. Subsequent encounter. EXAM: DG C-ARM 61-120 MIN; CERVICAL SPINE - 2-3 VIEW COMPARISON:  08/28/2017 plain film exam.  07/13/2017 MR. Fluoroscopic time: 19 seconds. FINDINGS: Three C-arm views submitted for review. Anterior discectomy with plate and screws in place lower cervical spine. Lateral view does not include the dens and therefore not able to evaluate level properly. This can be assessed on follow-up. Frontal view suggestive of this representing the C6-7 level. IMPRESSION: Fusion lower cervical spine possibly C6-7 level. Lateral view  including the dens would prove helpful for further delineation. These results will be called to the ordering clinician or representative by the Radiologist Assistant, and communication documented in the PACS or zVision Dashboard. Electronically Signed   By: Lacy Duverney M.D.   On: 09/03/2017 14:30     STUDIES:  CT soft tissues neck 12/4 >>>   CULTURES: Sputum cx 12/3 >  ANTIBIOTICS: Unasyn 12/3 >  SIGNIFICANT EVENTS: 12/3 admit, surgery, resp>cardiac arrest 20 mins. Intubated, to ICU  LINES/TUBES: ETT 12/3 >  DISCUSSION: 41 year old male presented for elective C-spine surgery. No overt complictaions, in post of setting developed respiratory distress subsequently cardiac arrest. 20 mins PEA.  Purposeful somewhat post-op.   ASSESSMENT / PLAN:  PULMONARY A: Acute hypoxemic respiratory failure Pulmonary edema vs aspiration PNA.   P:   Full vent support ABG CXR VAP bundle Lasix 20mg  given SBT in AM  CARDIOVASCULAR A:  PEA arrest > likely respiratory etiology  P:  Telemetry monitoring Trend troponin May need pressors ECG in AM Echocardiogram  RENAL A:   No acute issues  P:   Repeat BMP  GASTROINTESTINAL A:   No acute issues  P:   NPO PPI  HEMATOLOGIC A:   No acuate issues  P:  Follow H&H Repeat coags  INFECTIOUS A:   ? Aspiration  P:   ABX as above Follow cultures   ENDOCRINE A:   No acute issues  P:   Follow glucose on chemistry  NEUROLOGIC A:   Acute metabolic vs anoxic encephalopathy Cervical spondylosis:  S/p repair 12/3  P:   RASS goal: -1 to -2 Propofol infusion Fentanyl infusion BP getting low so coming down on prop, RN to switch to versed if needed.  CT soft tissue neck.    FAMILY  - Updates: Family updated by Dr. Ophelia Charter  - Inter-disciplinary family meet or Palliative Care meeting due by:  12/10   Joneen Roach, AGACNP-BC [place] [facility] Pager [phone] or [phone]  09/03/2017 10:02 PM

## 2017-09-03 NOTE — Anesthesia Procedure Notes (Signed)
Procedure Name: Intubation Date/Time: 09/03/2017 12:41 PM Performed by: Shirlyn Goltz, CRNA Pre-anesthesia Checklist: Patient identified, Emergency Drugs available, Suction available and Patient being monitored Patient Re-evaluated:Patient Re-evaluated prior to induction Oxygen Delivery Method: Circle system utilized Preoxygenation: Pre-oxygenation with 100% oxygen Induction Type: IV induction Ventilation: Mask ventilation without difficulty Laryngoscope Size: Mac and 4 Grade View: Grade II Tube type: Oral Tube size: 7.5 mm Number of attempts: 1 Airway Equipment and Method: Stylet Placement Confirmation: ETT inserted through vocal cords under direct vision,  positive ETCO2 and breath sounds checked- equal and bilateral Secured at: 22 cm Tube secured with: Tape Dental Injury: Teeth and Oropharynx as per pre-operative assessment

## 2017-09-03 NOTE — Progress Notes (Signed)
Around 2025, a co-RN and NT went on pt's room and found him almost passing out on the floor and assisted him back to bed; he stated he is choking. Assigned RN was told by co-RN to call Rapid Response but then upgraded to Code Blue for pt was announced non-responsive. Crash cart brought to the room, pt lying on bed unconscious, with no pulse palpated. CPR initiated at 2029, 3 cycles of chest compression was administered; at 2032, Code Team arrived and took over the situation. Pt  transferred to Neuro ICU when stabilized with members of the team by his bedside.

## 2017-09-03 NOTE — Anesthesia Postprocedure Evaluation (Signed)
Anesthesia Post Note  Patient: Hinton DyerSamuel Keith Mcphillips  Procedure(s) Performed: CERVICAL SIX-SEVEN  ANTERIOR CERVICAL DISCECTOMY AND FUSION, ALLOGRAFT, PLATE (N/A )     Patient location during evaluation: PACU Anesthesia Type: General Level of consciousness: awake and sedated Pain management: pain level controlled Vital Signs Assessment: post-procedure vital signs reviewed and stable Respiratory status: spontaneous breathing, nonlabored ventilation, respiratory function stable and patient connected to nasal cannula oxygen Cardiovascular status: blood pressure returned to baseline and stable Postop Assessment: no apparent nausea or vomiting Anesthetic complications: no    Last Vitals:  Vitals:   09/03/17 1453 09/03/17 1527  BP: (!) 149/76 (!) 143/83  Pulse:  83  Resp:  15  Temp: 36.6 C   SpO2:  95%    Last Pain:  Vitals:   09/03/17 1031  TempSrc: Oral  PainSc:                  Sia Gabrielsen,JAMES TERRILL

## 2017-09-03 NOTE — Interval H&P Note (Signed)
History and Physical Interval Note:  09/03/2017 12:14 PM  Vincent Davis  has presented today for surgery, with the diagnosis of C6-7 Spondylosis, Radiculopathy Left C7  The various methods of treatment have been discussed with the patient and family. After consideration of risks, benefits and other options for treatment, the patient has consented to  Procedure(s): C6-7 ANTERIOR CERVICAL DISCECTOMY AND FUSION, ALLOGRAFT, PLATE (N/A) as a surgical intervention .  The patient's history has been reviewed, patient examined, no change in status, stable for surgery.  I have reviewed the patient's chart and labs.  Questions were answered to the patient's satisfaction.     Eldred MangesMark C Ahlani Wickes

## 2017-09-03 NOTE — Transfer of Care (Signed)
Immediate Anesthesia Transfer of Care Note  Patient: Vincent Davis  Procedure(s) Performed: CERVICAL SIX-SEVEN  ANTERIOR CERVICAL DISCECTOMY AND FUSION, ALLOGRAFT, PLATE (N/A )  Patient Location: PACU  Anesthesia Type:General  Level of Consciousness: awake, alert , oriented and patient cooperative  Airway & Oxygen Therapy: Patient Spontanous Breathing and Patient connected to nasal cannula oxygen  Post-op Assessment: Report given to RN and Post -op Vital signs reviewed and stable  Post vital signs: Reviewed and stable  Last Vitals:  Vitals:   09/03/17 1445 09/03/17 1453  BP: (!) 149/76   Pulse: 100   Resp:    Temp:  (P) 36.6 C  SpO2: 99%     Last Pain:  Vitals:   09/03/17 1031  TempSrc: Oral  PainSc:       Patients Stated Pain Goal: 2 (09/03/17 1030)  Complications: No apparent anesthesia complications

## 2017-09-03 NOTE — Progress Notes (Signed)
Pharmacy Antibiotic Note  Vincent Davis is a 41 y.o. male admitted on 09/03/2017 with cervical fusion.  S/p PEA arrest with aspiration during code.  Pharmacy has been consulted for Unasyn dosing. Afebrile, wbc 12 Cr 1.1 crcl 17400ml/min  Plan: unasyn 3gm IV q6hr   Height: 5\' 11"  (180.3 cm) Weight: 237 lb (107.5 kg) IBW/kg (Calculated) : 75.3  Temp (24hrs), Avg:98.3 F (36.8 C), Min:97.8 F (36.6 C), Max:99 F (37.2 C)  Recent Labs  Lab 09/03/17 1014 09/03/17 2115  WBC 12.4* 24.2*  CREATININE 1.17  --     Estimated Creatinine Clearance: 103.7 mL/min (by C-G formula based on SCr of 1.17 mg/dL).    No Known Allergies  Antimicrobials this admission:   Dose adjustments this admission:   Microbiology results:  Vincent SauersLisa Hance Davis Pharm.D. CPP, BCPS Clinical Pharmacist [phone] 09/03/2017 10:22 PM

## 2017-09-03 NOTE — Anesthesia Preprocedure Evaluation (Signed)
Anesthesia Evaluation  Patient identified by MRN, date of birth, ID band Patient awake    Reviewed: Allergy & Precautions, NPO status , Patient's Chart, lab work & pertinent test results  History of Anesthesia Complications (+) Family history of anesthesia reaction  Airway Mallampati: II  TM Distance: >3 FB Neck ROM: Full    Dental no notable dental hx.    Pulmonary pneumonia, Current Smoker,    Pulmonary exam normal breath sounds clear to auscultation       Cardiovascular negative cardio ROS Normal cardiovascular exam Rhythm:Regular Rate:Normal     Neuro/Psych negative psych ROS   GI/Hepatic negative GI ROS, Neg liver ROS,   Endo/Other  negative endocrine ROS  Renal/GU negative Renal ROS     Musculoskeletal  (+) Arthritis ,   Abdominal (+) + obese,   Peds  Hematology negative hematology ROS (+)   Anesthesia Other Findings   Reproductive/Obstetrics negative OB ROS                             Anesthesia Physical Anesthesia Plan  ASA: II  Anesthesia Plan: General   Post-op Pain Management:    Induction: Intravenous  PONV Risk Score and Plan: 2  Airway Management Planned: Oral ETT  Additional Equipment:   Intra-op Plan:   Post-operative Plan: Extubation in OR  Informed Consent: I have reviewed the patients History and Physical, chart, labs and discussed the procedure including the risks, benefits and alternatives for the proposed anesthesia with the patient or authorized representative who has indicated his/her understanding and acceptance.   Dental advisory given  Plan Discussed with: CRNA  Anesthesia Plan Comments:         Anesthesia Quick Evaluation

## 2017-09-03 NOTE — Op Note (Signed)
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  Postop diagnosis: Same  Procedure: C6-7 anterior cervical discectomy and fusion allograft and plate.  Surgeon: Annell GreeningMark Katherene Dinino MD  Assistant: Zonia KiefJames Owens PA-C medically necessary and present for the entire procedure  Anesthesia: G OT +6 cc Marcaine local  Drains: One Hemovac neck complications:  Implants: depuy skyline 12 mm plate with 14 mm screws x4.  7 mm cortical cancellous VG2 graft  Brief history 41 year old male with persistent radiculopathy positive EMGs nerve conduction velocities and MRI scan showing moderate foraminal stenosis by foraminal E at C6-7.  Patient had arm weakness numbness and had failed conservative treatment.  Procedure: After induction of general anesthesia preoperative antibiotics timeout procedure prepping with DuraPrep after head halter traction without weight had been applied arms tucked at the sides wrist restraints applied for traction during C arm visualization spot pictures the area was squared with towel sterile skin marker used starting at the midline extending to the left based on palpable landmarks and Betadine Steri-Drape thyroid she draped sterile male standard at the head.  Incision was made starting at the midline extending to the left prominent skin fold.  Platysma was divided blunt dissection down to the prominent spur at C5-6.  Initial x-ray showed 25 short needle was straight clamp was at C5-6 level and then we moved down one level and repeat the x-ray to confirm that we are at C6-7.  Patient had overhanging spurs from C5-6 that extended down almost to C6-7.  Anterior spurs at C6-7 were removed operative microscope was draped and brought in procedure as the disc was removed progressing back to the posterior longitudinal ligament 1 and 2 mm Kerrisons removing spurs posterior longitudinal ligament and uncovertebral joint scraping with a Cloward curettes.  A 4 mm bur was used the  endplates with curettes.  Once posterior longitudinal ligament was completely removed and bone was removed out on the left in the area of compression trial sizer showed a 7 mm was appropriate size 6 would fit but was not quite tight enough.  7 restore disc space height and 7 mm graft was marked at the midline inserted and countersunk 2 mm additional spurs were removed that were overhanging down from C5-6 12 mm plate would fit.  C arm had to be cone-down in order to visualize even with wrist restraints and pulling traction on the wrist to visualize graft and screws.  The lateral x-rays were taken after all screws were inserted and then screwdriver was used for locked down each screw x4.  Irrigation with saline solution Hemovac placed within and out technique in line with the skin incision.  Closure with 3-0 Vicryl in the platysma 4-0 Vicryl subcuticular closure tincture benzoin Steri-Strips Marcaine infiltration 4 x 4's tape and soft cervical collar with collar extender was applied.  Patient was neurologically intact in the recovery room.

## 2017-09-04 ENCOUNTER — Observation Stay (HOSPITAL_COMMUNITY): Payer: Self-pay

## 2017-09-04 ENCOUNTER — Encounter (HOSPITAL_COMMUNITY): Payer: Self-pay | Admitting: Orthopaedic Surgery

## 2017-09-04 ENCOUNTER — Observation Stay (HOSPITAL_BASED_OUTPATIENT_CLINIC_OR_DEPARTMENT_OTHER): Payer: Self-pay

## 2017-09-04 DIAGNOSIS — I469 Cardiac arrest, cause unspecified: Secondary | ICD-10-CM

## 2017-09-04 LAB — TROPONIN I
TROPONIN I: 0.06 ng/mL — AB (ref <0.03)
Troponin I: 0.15 ng/mL (ref <0.03)
Troponin I: 0.09 ng/mL (ref <0.03)
Troponin I: 0.07 ng/mL (ref <0.03)

## 2017-09-04 LAB — GLUCOSE, CAPILLARY
GLUCOSE-CAPILLARY: 132 mg/dL — AB (ref 65–99)
Glucose-Capillary: 116 mg/dL — ABNORMAL HIGH (ref 65–99)
Glucose-Capillary: 120 mg/dL — ABNORMAL HIGH (ref 65–99)

## 2017-09-04 LAB — BLOOD GAS, ARTERIAL
Acid-Base Excess: 1 mmol/L (ref 0.0–2.0)
Bicarbonate: 27.2 mmol/L (ref 20.0–28.0)
Drawn by: 39899
FIO2: 50
Mode: POSITIVE
O2 SAT: 97.1 %
PATIENT TEMPERATURE: 98.6
PEEP/CPAP: 5 cmH2O
PO2 ART: 106 mmHg (ref 83.0–108.0)
PRESSURE SUPPORT: 18 cmH2O
pCO2 arterial: 60.8 mmHg — ABNORMAL HIGH (ref 32.0–48.0)
pH, Arterial: 7.273 — ABNORMAL LOW (ref 7.350–7.450)

## 2017-09-04 LAB — PHOSPHORUS
PHOSPHORUS: 3 mg/dL (ref 2.5–4.6)
PHOSPHORUS: 3 mg/dL (ref 2.5–4.6)

## 2017-09-04 LAB — MAGNESIUM
MAGNESIUM: 2 mg/dL (ref 1.7–2.4)
MAGNESIUM: 1.9 mg/dL (ref 1.7–2.4)

## 2017-09-04 LAB — BASIC METABOLIC PANEL
Anion gap: 11 (ref 5–15)
BUN: 7 mg/dL (ref 6–20)
CALCIUM: 8.3 mg/dL — AB (ref 8.9–10.3)
CO2: 24 mmol/L (ref 22–32)
CREATININE: 1.18 mg/dL (ref 0.61–1.24)
Chloride: 102 mmol/L (ref 101–111)
GFR calc non Af Amer: >60 mL/min (ref >60)
Glucose, Bld: 127 mg/dL — ABNORMAL HIGH (ref 65–99)
Potassium: 3.6 mmol/L (ref 3.5–5.1)
SODIUM: 137 mmol/L (ref 135–145)

## 2017-09-04 LAB — ECHOCARDIOGRAM COMPLETE
HEIGHTINCHES: 71 in
WEIGHTICAEL: 3792 [oz_av]

## 2017-09-04 MED ORDER — VITAL HIGH PROTEIN PO LIQD
1000.0000 mL | ORAL | Status: DC
  Administered 2017-09-04: 1000 mL

## 2017-09-04 MED ORDER — IOPAMIDOL (ISOVUE-300) INJECTION 61%
INTRAVENOUS | Status: AC
  Administered 2017-09-04: 75 mL
  Filled 2017-09-04: qty 75

## 2017-09-04 MED ORDER — PRO-STAT SUGAR FREE PO LIQD
60.0000 mL | Freq: Every day | ORAL | Status: DC
  Administered 2017-09-04 – 2017-09-05 (×5): 60 mL
  Filled 2017-09-04 (×4): qty 60

## 2017-09-04 MED ORDER — ASPIRIN 81 MG PO CHEW
81.0000 mg | CHEWABLE_TABLET | Freq: Every day | ORAL | Status: DC
  Administered 2017-09-04 – 2017-09-14 (×10): 81 mg via ORAL
  Filled 2017-09-04 (×11): qty 1

## 2017-09-04 MED ORDER — SODIUM POLYSTYRENE SULFONATE 15 GM/60ML PO SUSP
30.0000 g | Freq: Once | ORAL | Status: AC
  Administered 2017-09-04: 30 g
  Filled 2017-09-04: qty 120

## 2017-09-04 MED FILL — Medication: Qty: 1 | Status: AC

## 2017-09-04 NOTE — H&P (Signed)
CRITICAL VALUE ALERT  Critical Value:  Lactic acid 9.6, Troponin .05, Potassium 5  Date & Time Notied:  09/04/2017 0018  Provider Notified: Abby RN eLink   Orders Received/Actions taken: no new orders at this time.

## 2017-09-04 NOTE — Progress Notes (Signed)
Subjective: 1 Day Post-Op Procedure(s) (LRB): CERVICAL SIX-SEVEN  ANTERIOR CERVICAL DISCECTOMY AND FUSION, ALLOGRAFT, PLATE (N/A) Patient reports pain as mild . Intubated on 70% O2. MAE to command, still sedated prop and fent.    Objective: Vital signs in last 24 hours: Temp:  [97.5 F (36.4 C)-99 F (37.2 C)] 97.5 F (36.4 C) (12/04 0400) Pulse Rate:  [71-140] 77 (12/04 0645) Resp:  [13-31] 22 (12/04 0645) BP: (81-224)/(54-155) 102/69 (12/04 0645) SpO2:  [95 %-100 %] 100 % (12/04 0645) FiO2 (%):  [70 %-100 %] 70 % (12/04 0310) Weight:  [237 lb (107.5 kg)] 237 lb (107.5 kg) (12/03 1031)  Intake/Output from previous day: 12/03 0701 - 12/04 0700 In: 2393.2 [P.O.:600; I.V.:1793.2] Out: 900 [Urine:600; Blood:100] Intake/Output this shift: No intake/output data recorded.  Recent Labs    09/03/17 1014 09/03/17 2115  HGB 15.7 14.3   Recent Labs    09/03/17 1014 09/03/17 2115  WBC 12.4* 24.2*  RBC 5.20 4.83  HCT 47.4 45.8  PLT 312 271   Recent Labs    09/03/17 2115 09/03/17 2226  NA 137 134*  K 4.6 5.0  CL 95* 95*  CO2 14* 15*  BUN 8 9  CREATININE 2.02* 1.98*  GLUCOSE 417* 327*  CALCIUM 10.3 9.4   Recent Labs    09/03/17 2226  INR 1.01    Moves all extremities to command.  Dg Cervical Spine 1 View  Result Date: 09/03/2017 CLINICAL DATA:  41 year old male with a history of ACDF EXAM: CERVICAL SPINE 1 VIEW COMPARISON:  None. FINDINGS: Single cross-table lateral of the cervical spine, status post C6-C7 ACDF. Gas within the soft tissues of the surgical bed. Hardware incompletely imaged given the overlying soft tissues of the shoulder. Surgical drain present. IMPRESSION: Single cross-table lateral of the cervical spine, status post C6-C7 ACDF, with incomplete visualization of the hardware. Surgical drain present within the soft tissues. Electronically Signed   By: Gilmer MorJaime  Wagner D.O.   On: 09/03/2017 15:28   Dg Cervical Spine 2-3 Views  Result Date:  09/03/2017 CLINICAL DATA:  41 year old male for C6-7 fusion. Subsequent encounter. EXAM: DG C-ARM 61-120 MIN; CERVICAL SPINE - 2-3 VIEW COMPARISON:  08/28/2017 plain film exam.  07/13/2017 MR. Fluoroscopic time: 19 seconds. FINDINGS: Three C-arm views submitted for review. Anterior discectomy with plate and screws in place lower cervical spine. Lateral view does not include the dens and therefore not able to evaluate level properly. This can be assessed on follow-up. Frontal view suggestive of this representing the C6-7 level. IMPRESSION: Fusion lower cervical spine possibly C6-7 level. Lateral view including the dens would prove helpful for further delineation. These results will be called to the ordering clinician or representative by the Radiologist Assistant, and communication documented in the PACS or zVision Dashboard. Electronically Signed   By: Lacy DuverneySteven  Olson M.D.   On: 09/03/2017 14:30   Ct Soft Tissue Neck W Contrast  Result Date: 09/04/2017 CLINICAL DATA:  Initial evaluation for diffuse neck swelling, status post cervical fusion followed by a cardiac arrest. Evaluation for EXAM: CT NECK WITH CONTRAST TECHNIQUE: Multidetector CT imaging of the neck was performed using the standard protocol following the bolus administration of intravenous contrast. CONTRAST:  75mL ISOVUE-300 IOPAMIDOL (ISOVUE-300) INJECTION 61% COMPARISON:  Prior MRI from 07/13/2017. FINDINGS: Pharynx and larynx: Patient is intubated with endotracheal and nasogastric tubes in place. Oral cavity subsequently limited in evaluation but is grossly normal without loculated collection or mass lesion. No acute abnormality about the dentition. Nasopharynx and  oropharynx largely collapsed around the endotracheal and enteric tubes, grossly within normal limits. Parapharyngeal fat maintained. There is extensive prevertebral/retropharyngeal soft tissue density extending from the C1-2 level inferiorly to the newly placed ACDF at C6-7. Although exact  measurements are somewhat difficult due to lack of fat planes, area measures approximately 8.8 x 5.1 x 9.2 cm in greatest dimensions This collection demonstrates a density of approximately 80 Hounsfield units, and is most consistent with hemorrhage/blood. Collection involves nearly the entirety of the retropharyngeal space, with lateral extension towards the parapharyngeal and carotid spaces. Please note that the inferior margin of this collection is poorly visualized due to streak artifact from the cervical fusion. In fact, ill-defined hyperdensity inferior to the fusion is suspicious for extension of hematoma towards the upper posterior mediastinum (series 3, image 81). Esophagus mildly displaced to the right by probable hematoma (series 3, image 89). Soft tissue stranding with a few foci of emphysema present within the left neck along the surgical approach. Surgical drain in place within the left neck, tip terminating just anterior to the left lobe of thyroid. Hypopharynx and supraglottic larynx not well evaluated due to intubation and hematoma. Subglottic airway is clear. Anterior margin of the endotracheal tube not visualized on this exam. Salivary glands: Salivary glands including the parotid and submandibular glands within normal limits. Postoperative stranding closely approximates the posterior margin of the left submandibular gland. Thyroid: Thyroid within normal limits. Lymph nodes: No adenopathy within the visualized neck. Vascular: Normal intravascular enhancement seen within the neck. No appreciable active contrast extravasation. Limited intracranial: Unremarkable. Visualized orbits: Visualized globes and orbital soft tissues within normal limits. Mastoids and visualized paranasal sinuses: Scattered mucosal thickening throughout the ethmoidal air cells, with small fluid levels present within the sphenoid sinuses. Mild left maxillary sinus mucosal thickening noted as well. Mastoid air cells and middle  ear cavities are clear. Skeleton: ACDF in place at C6-7. Hardware appears well aligned. No acute osseous abnormality. No worrisome lytic or blastic osseous lesions. Upper chest: Partially visualized upper chest demonstrates no acute abnormality. Biapical pleuroparenchymal thickening, not well evaluated on this exam. Other: Mild subcutaneous swelling/ stranding present within the anterior neck. IMPRESSION: 1. Postoperative changes from recent ACDF at C6-7. Extensive hyperdense collection/fullness throughout the prevertebral/retropharyngeal space extending from C1 to inferiorly and to the upper mediastinum, most likely reflecting hematoma. Collection measures approximately 8.8 x 5.1 x 9.2 cm, although craniocaudad dimension difficult to assess due to streak artifact from C6-7 ACDF. 2. Associated postoperative stranding/emphysema within the left neck. Surgical drain in place with tip positioned just anterior to the left thyroid lobe. 3. C6-7 ACDF in place without additional complication. Electronically Signed   By: Rise MuBenjamin  McClintock M.D.   On: 09/04/2017 03:20   Dg Chest Port 1 View  Result Date: 09/03/2017 CLINICAL DATA:  41 year old male status post cardiac arrest and resuscitation. Difficult intubation. EXAM: PORTABLE CHEST 1 VIEW COMPARISON:  Cervical spine radiographs earlier today. FINDINGS: Portable AP supine view at 2140 hours. Endotracheal tube tip projects over the tracheal air column at the level the clavicles. Pacer or resuscitation pads project over the left chest. Coarse and nodular widespread bilateral pulmonary opacity, most confluent in the medial right lung apex and the left lung base. Mediastinal contours appear normal. Moderate gaseous distension of the visible stomach. No displaced rib fracture identified. Lower cervical ACDF changes with postoperative drain partially visible. IMPRESSION: 1. Endotracheal tube tip in good position at the level the clavicles. 2. Widespread abnormal pulmonary  opacity, most confluent in  the medial right upper lobe and left lung base. Top differential considerations include aspiration and asymmetric pulmonary edema with atelectasis. Electronically Signed   By: Odessa Fleming M.D.   On: 09/03/2017 21:59   Dg Abd Portable 1v  Result Date: 09/04/2017 CLINICAL DATA:  NG tube placement. EXAM: PORTABLE ABDOMEN - 1 VIEW COMPARISON:  Same date. FINDINGS: The bowel gas pattern is normal. No radio-opaque calculi or other significant radiographic abnormality are seen. Enteric catheter with tip overlying the expected location of gastric cardia. IMPRESSION: Enteric catheter with tip overlying the expected location of gastric cardia. Electronically Signed   By: Ted Mcalpine M.D.   On: 09/04/2017 04:44   Dg Abd Portable 1v  Addendum Date: 09/04/2017   ADDENDUM REPORT: 09/04/2017 03:15 ADDENDUM: Enteric catheter descends to the abdomen, tip overlying gastric cardia. It is not certain whether this side hole is within the distal esophagus or proximal stomach. Electronically Signed   By: Ted Mcalpine M.D.   On: 09/04/2017 03:15   Result Date: 09/04/2017 CLINICAL DATA:  NG tube placement. EXAM: PORTABLE ABDOMEN - 1 VIEW COMPARISON:  None. FINDINGS: The bowel gas pattern is normal. No radio-opaque calculi or organomegaly is seen. Enteric catheter is not seen. Shock pads overlie the upper abdomen. IMPRESSION: Enteric catheter is not seen. Electronically Signed: By: Ted Mcalpine M.D. On: 09/04/2017 02:42   Dg C-arm 1-60 Min  Result Date: 09/03/2017 CLINICAL DATA:  41 year old male for C6-7 fusion. Subsequent encounter. EXAM: DG C-ARM 61-120 MIN; CERVICAL SPINE - 2-3 VIEW COMPARISON:  08/28/2017 plain film exam.  07/13/2017 MR. Fluoroscopic time: 19 seconds. FINDINGS: Three C-arm views submitted for review. Anterior discectomy with plate and screws in place lower cervical spine. Lateral view does not include the dens and therefore not able to evaluate level properly.  This can be assessed on follow-up. Frontal view suggestive of this representing the C6-7 level. IMPRESSION: Fusion lower cervical spine possibly C6-7 level. Lateral view including the dens would prove helpful for further delineation. These results will be called to the ordering clinician or representative by the Radiologist Assistant, and communication documented in the PACS or zVision Dashboard. Electronically Signed   By: Lacy Duverney M.D.   On: 09/03/2017 14:30    Assessment/Plan: 1 Day Post-Op Procedure(s) (LRB): CERVICAL SIX-SEVEN  ANTERIOR CERVICAL DISCECTOMY AND FUSION, ALLOGRAFT, PLATE (N/A) Plan: cont vent support.  CT scan shows graft plate and screws in good position. Has hematoma more cephalad to operative site extends to C2 .  Post op hematoma with acute respiratory failure. HV not draining back out during code at some point and had 20 ml drainage.  Lungs on CXR look better. Continue Vent support.   Eldred Manges 09/04/2017, 7:53 AM

## 2017-09-04 NOTE — Progress Notes (Signed)
Patient ID: Vincent Davis, male   DOB: 06/27/1976, 41 y.o.   MRN: [id number] Patient on 40% O2.  CT scan shows hematoma more cephalad then fusion level may be related to difficult intubation or hematoma from surgery migrated. He had minimal HV output before he coded. With difficult intubation requiring 3 attempts to get tube placed with intermittent bagging by rapid Code Team Response that a couple days of sitting up to decrease swelling before tube removal is a good idea. Thank you for excellent ICU care.     Cell [phone]

## 2017-09-04 NOTE — Progress Notes (Signed)
Chaplain responded to a Code Blue pager request of a patient who has CPR in progress at this time.  Chaplain assisted a a young lady who was with the patient, who reports she is the patient's girl friend. Emotional support, and comfort measures were provided for her, there was no direct contact with the patient at this time. The patient was transported to 4N26, and Chaplain escorted the girlfriend to the Unit, she contacted other family members who arrived at a later time. Chaplain will continue to follow up as needed. Britt Bolognesehaplain Montae Stager [phone]

## 2017-09-04 NOTE — Progress Notes (Signed)
eLink Physician-Brief Progress Note Patient Name: Vincent DyerSamuel Keith Davis DOB: 03/17/1976 MRN: [id number]   Date of Service  09/04/2017  HPI/Events of Note  Called d/t K+ = 5.0, Lactic Acid = 9.6, Troponin = 0.05 all in setting of s/p renal insufficiency, cardiac arrest and CPR.  eICU Interventions  Will order: 1. Kayexalate 30 gm per tube now.  2. Continue to trend K+ , Lactic Acid and Troponin.     Intervention Category Major Interventions: Electrolyte abnormality - evaluation and management  Sommer,Steven Eugene 09/04/2017, 12:24 AM

## 2017-09-04 NOTE — Progress Notes (Signed)
eLink Physician-Brief Progress Note Patient Name: Hinton DyerSamuel Keith Larocca DOB: 02/04/1976 MRN: [id number]   Date of Service  09/04/2017  HPI/Events of Note  Troponin = 0.15 - Likely d/t CPR.  eICU Interventions  Will order: 1. ASA 81 mg per tube now and Q day. 2. Continue to trend Troponin.     Intervention Category Intermediate Interventions: Diagnostic test evaluation  Jerry Clyne Dennard Nipugene 09/04/2017, 5:43 AM

## 2017-09-04 NOTE — Progress Notes (Signed)
Spoke with Dr. Arsenio LoaderSommer regarding NG tube placement, requesting advancement of 5 cm with repeat ABD x-ray for confirmation.

## 2017-09-04 NOTE — Progress Notes (Signed)
Family, to include pt mother and multiple brothers from the [place] area, just into town and at the bedside to see the pt. Family update on the pt's situation. Family informed of a "recent", within the past 5 years, loss of a sibling. Family stated they will stay nearby in anticipation to speak with the doctors during routine rounding.

## 2017-09-04 NOTE — Progress Notes (Signed)
Initial Nutrition Assessment  DOCUMENTATION CODES:   Obesity unspecified  INTERVENTION:   Vital High Protein @ 10 ml/hr (240 ml/day) 60 ml Prostat five times per day  Provides: 1240 kcal, 171 grams protein, and 200 ml free water.  TF regimen and propofol at current rate providing 1580 total kcal/day   NUTRITION DIAGNOSIS:   Inadequate oral intake related to inability to eat as evidenced by NPO status.  GOAL:   Patient will meet greater than or equal to 90% of their needs  MONITOR:   TF tolerance, I & O's  REASON FOR ASSESSMENT:   Consult, Ventilator Enteral/tube feeding initiation and management  ASSESSMENT:   Pt with PMH of cervical spondylosis with radiculopathy admitted 12/3 for elective repair. Post op pt developed respiratory distress with cardiac arrest.    Pt discussed during ICU rounds and with RN.  Patient is currently intubated on ventilator support  Propofol: 12.9 ml/hr provides: 340 kcal  Medications reviewed and include: colace, miralax, kayexalate given this am  Labs reviewed   Unable to complete Nutrition-Focused physical exam at this time.   Diet Order:  Diet NPO time specified  EDUCATION NEEDS:   No education needs have been identified at this time  Skin:  Skin Assessment: Skin Integrity Issues: Skin Integrity Issues:: Incisions Incisions: neck  Last BM:  12/2  Height:   Ht Readings from Last 1 Encounters:  09/03/17 5\' 11"  (1.803 m)    Weight:   Wt Readings from Last 1 Encounters:  09/03/17 237 lb (107.5 kg)    Ideal Body Weight:  78.1 kg  BMI:  Body mass index is 33.05 kg/m.  Estimated Nutritional Needs:   Kcal:  [id number]  Protein:  >156 grams   Fluid:  >1.5 L/day   Kendell BaneHeather Tequisha Maahs RD, LDN, CNSC [phone] Pager [phone] After Hours Pager

## 2017-09-04 NOTE — Progress Notes (Signed)
  Echocardiogram 2D Echocardiogram has been performed.  Celene SkeenVijay  Thoren Davis 09/04/2017, 12:35 PM

## 2017-09-04 NOTE — Progress Notes (Signed)
PULMONARY / CRITICAL CARE MEDICINE   Name: Vincent Davis MRN: [id number] DOB: [birth year]    ADMISSION DATE:  09/03/2017 CONSULTATION DATE:  09/03/2017  REFERRING MD:  Dr. Carolyne Littles  CHIEF COMPLAINT:  PEA arrest  HISTORY OF PRESENT ILLNESS:   41 year old male with history of cervical spondylosis with radiculopathy. He presented 12/3 for elective repair under Dr. Ophelia Charter. The surgery was without complication. In the postoperative setting he was doing well and recovering as expected with the exception of some respiratory secretions. Was conversant with nurse. She left him to get pain medication and upon returning a few minutes later she found him unresponsive and pulseless. PEA. He was coded off and on for 20 mins, but there was eventual return of spontaneous circulation that endured. He was transferred to ICU.     SUBJECTIVE:  Follows commands despite being sedated.  Remains on 50% FiO2  VITAL SIGNS: BP 103/64   Pulse 86   Temp 98.6 F (37 C) (Axillary)   Resp 14   Ht 5\' 11"  (1.803 m)   Wt 237 lb (107.5 kg)   SpO2 100%   BMI 33.05 kg/m   HEMODYNAMICS:    VENTILATOR SETTINGS: Vent Mode: PSV;CPAP FiO2 (%):  [50 %-100 %] 50 % Set Rate:  [22 bmp] 22 bmp Vt Set:  [409 mL] 620 mL PEEP:  [5 cmH20] 5 cmH20 Pressure Support:  [18 cmH20] 18 cmH20 Plateau Pressure:  [22 cmH20-25 cmH20] 25 cmH20  INTAKE / OUTPUT: I/O last 3 completed shifts: In: 2393.2 [P.O.:600; I.V.:1793.2] Out: 900 [Urine:600; Other:200; Blood:100]  PHYSICAL EXAMINATION: General: Well-nourished well-developed male currently on ventilator. HEENT: Endotracheal tube to vent, anterior cervical incisions are clean dry and intact PSY: Dull effect Neuro: Moves all extremities x4 to command.  Nods to questions appropriately.  Is sedated with propofol and fentanyl currently. CV: s1s2 rrr, no m/r/g PULM: Decreased breath sounds in the bases.  Currently on 50% FiO2. WJ:XBJY, non-tender, bsx4 active   Extremities: warm/dry, 1+ edema  Skin: no rashes or lesions   LABS:  BMET Recent Labs  Lab 09/03/17 1014 09/03/17 2115 09/03/17 2226  NA 139 137 134*  K 3.6 4.6 5.0  CL 105 95* 95*  CO2 24 14* 15*  BUN 6 8 9   CREATININE 1.17 2.02* 1.98*  GLUCOSE 106* 417* 327*    Electrolytes Recent Labs  Lab 09/03/17 1014 09/03/17 2115 09/03/17 2226 09/04/17 0425  CALCIUM 9.1 10.3 9.4  --   MG  --   --   --  2.0  PHOS  --   --   --  3.0    CBC Recent Labs  Lab 09/03/17 1014 09/03/17 2115  WBC 12.4* 24.2*  HGB 15.7 14.3  HCT 47.4 45.8  PLT 312 271    Coag's Recent Labs  Lab 09/03/17 2226  INR 1.01    Sepsis Markers Recent Labs  Lab 09/03/17 2226  LATICACIDVEN 9.6*    ABG Recent Labs  Lab 09/03/17 2133  PHART 7.055*  PCO2ART 50.1*  PO2ART 172.0*    Liver Enzymes Recent Labs  Lab 09/03/17 2115  AST 65*  ALT 33  ALKPHOS 44  BILITOT 0.7  ALBUMIN 3.6    Cardiac Enzymes Recent Labs  Lab 09/03/17 2115 09/03/17 2226 09/04/17 0425  TROPONINI <0.03 0.05* 0.15*    Glucose Recent Labs  Lab 09/03/17 2046  GLUCAP 255*    Imaging Dg Cervical Spine 1 View  Result Date: 09/03/2017 CLINICAL DATA:  41 year old male with  a history of ACDF EXAM: CERVICAL SPINE 1 VIEW COMPARISON:  None. FINDINGS: Single cross-table lateral of the cervical spine, status post C6-C7 ACDF. Gas within the soft tissues of the surgical bed. Hardware incompletely imaged given the overlying soft tissues of the shoulder. Surgical drain present. IMPRESSION: Single cross-table lateral of the cervical spine, status post C6-C7 ACDF, with incomplete visualization of the hardware. Surgical drain present within the soft tissues. Electronically Signed   By: Gilmer Mor D.O.   On: 09/03/2017 15:28   Dg Cervical Spine 2-3 Views  Result Date: 09/03/2017 CLINICAL DATA:  41 year old male for C6-7 fusion. Subsequent encounter. EXAM: DG C-ARM 61-120 MIN; CERVICAL SPINE - 2-3 VIEW COMPARISON:   08/28/2017 plain film exam.  07/13/2017 MR. Fluoroscopic time: 19 seconds. FINDINGS: Three C-arm views submitted for review. Anterior discectomy with plate and screws in place lower cervical spine. Lateral view does not include the dens and therefore not able to evaluate level properly. This can be assessed on follow-up. Frontal view suggestive of this representing the C6-7 level. IMPRESSION: Fusion lower cervical spine possibly C6-7 level. Lateral view including the dens would prove helpful for further delineation. These results will be called to the ordering clinician or representative by the Radiologist Assistant, and communication documented in the PACS or zVision Dashboard. Electronically Signed   By: Lacy Duverney M.D.   On: 09/03/2017 14:30   Ct Soft Tissue Neck W Contrast  Result Date: 09/04/2017 CLINICAL DATA:  Initial evaluation for diffuse neck swelling, status post cervical fusion followed by a cardiac arrest. Evaluation for EXAM: CT NECK WITH CONTRAST TECHNIQUE: Multidetector CT imaging of the neck was performed using the standard protocol following the bolus administration of intravenous contrast. CONTRAST:  75mL ISOVUE-300 IOPAMIDOL (ISOVUE-300) INJECTION 61% COMPARISON:  Prior MRI from 07/13/2017. FINDINGS: Pharynx and larynx: Patient is intubated with endotracheal and nasogastric tubes in place. Oral cavity subsequently limited in evaluation but is grossly normal without loculated collection or mass lesion. No acute abnormality about the dentition. Nasopharynx and oropharynx largely collapsed around the endotracheal and enteric tubes, grossly within normal limits. Parapharyngeal fat maintained. There is extensive prevertebral/retropharyngeal soft tissue density extending from the C1-2 level inferiorly to the newly placed ACDF at C6-7. Although exact measurements are somewhat difficult due to lack of fat planes, area measures approximately 8.8 x 5.1 x 9.2 cm in greatest dimensions This collection  demonstrates a density of approximately 80 Hounsfield units, and is most consistent with hemorrhage/blood. Collection involves nearly the entirety of the retropharyngeal space, with lateral extension towards the parapharyngeal and carotid spaces. Please note that the inferior margin of this collection is poorly visualized due to streak artifact from the cervical fusion. In fact, ill-defined hyperdensity inferior to the fusion is suspicious for extension of hematoma towards the upper posterior mediastinum (series 3, image 81). Esophagus mildly displaced to the right by probable hematoma (series 3, image 89). Soft tissue stranding with a few foci of emphysema present within the left neck along the surgical approach. Surgical drain in place within the left neck, tip terminating just anterior to the left lobe of thyroid. Hypopharynx and supraglottic larynx not well evaluated due to intubation and hematoma. Subglottic airway is clear. Anterior margin of the endotracheal tube not visualized on this exam. Salivary glands: Salivary glands including the parotid and submandibular glands within normal limits. Postoperative stranding closely approximates the posterior margin of the left submandibular gland. Thyroid: Thyroid within normal limits. Lymph nodes: No adenopathy within the visualized neck. Vascular: Normal intravascular  enhancement seen within the neck. No appreciable active contrast extravasation. Limited intracranial: Unremarkable. Visualized orbits: Visualized globes and orbital soft tissues within normal limits. Mastoids and visualized paranasal sinuses: Scattered mucosal thickening throughout the ethmoidal air cells, with small fluid levels present within the sphenoid sinuses. Mild left maxillary sinus mucosal thickening noted as well. Mastoid air cells and middle ear cavities are clear. Skeleton: ACDF in place at C6-7. Hardware appears well aligned. No acute osseous abnormality. No worrisome lytic or blastic  osseous lesions. Upper chest: Partially visualized upper chest demonstrates no acute abnormality. Biapical pleuroparenchymal thickening, not well evaluated on this exam. Other: Mild subcutaneous swelling/ stranding present within the anterior neck. IMPRESSION: 1. Postoperative changes from recent ACDF at C6-7. Extensive hyperdense collection/fullness throughout the prevertebral/retropharyngeal space extending from C1 to inferiorly and to the upper mediastinum, most likely reflecting hematoma. Collection measures approximately 8.8 x 5.1 x 9.2 cm, although craniocaudad dimension difficult to assess due to streak artifact from C6-7 ACDF. 2. Associated postoperative stranding/emphysema within the left neck. Surgical drain in place with tip positioned just anterior to the left thyroid lobe. 3. C6-7 ACDF in place without additional complication. Electronically Signed   By: Rise MuBenjamin  McClintock M.D.   On: 09/04/2017 03:20   Portable Chest Xray  Result Date: 09/04/2017 CLINICAL DATA:  Endotracheal tube EXAM: PORTABLE CHEST 1 VIEW COMPARISON:  Yesterday FINDINGS: Endotracheal tube tip at the clavicular heads. An orogastric tube reaches the stomach. Defibrillator pad is present. There is improved interstitial and airspace opacity, either aspiration pneumonitis or asymmetric edema could respond this rapidly. Cardiopericardial enlargement. No visible effusion or pneumothorax. IMPRESSION: 1. Stable positioning of endotracheal tube. New orogastric tube that reaches the stomach. 2. Partial clearing of bilateral lung opacity. Either aspiration pneumonitis or asymmetric edema could respond this rapidly. Electronically Signed   By: Marnee SpringJonathon  Watts M.D.   On: 09/04/2017 08:46   Dg Chest Port 1 View  Result Date: 09/03/2017 CLINICAL DATA:  41 year old male status post cardiac arrest and resuscitation. Difficult intubation. EXAM: PORTABLE CHEST 1 VIEW COMPARISON:  Cervical spine radiographs earlier today. FINDINGS: Portable AP  supine view at 2140 hours. Endotracheal tube tip projects over the tracheal air column at the level the clavicles. Pacer or resuscitation pads project over the left chest. Coarse and nodular widespread bilateral pulmonary opacity, most confluent in the medial right lung apex and the left lung base. Mediastinal contours appear normal. Moderate gaseous distension of the visible stomach. No displaced rib fracture identified. Lower cervical ACDF changes with postoperative drain partially visible. IMPRESSION: 1. Endotracheal tube tip in good position at the level the clavicles. 2. Widespread abnormal pulmonary opacity, most confluent in the medial right upper lobe and left lung base. Top differential considerations include aspiration and asymmetric pulmonary edema with atelectasis. Electronically Signed   By: Odessa FlemingH  Hall M.D.   On: 09/03/2017 21:59   Dg Abd Portable 1v  Result Date: 09/04/2017 CLINICAL DATA:  NG tube placement. EXAM: PORTABLE ABDOMEN - 1 VIEW COMPARISON:  Same date. FINDINGS: The bowel gas pattern is normal. No radio-opaque calculi or other significant radiographic abnormality are seen. Enteric catheter with tip overlying the expected location of gastric cardia. IMPRESSION: Enteric catheter with tip overlying the expected location of gastric cardia. Electronically Signed   By: Ted Mcalpineobrinka  Dimitrova M.D.   On: 09/04/2017 04:44   Dg Abd Portable 1v  Addendum Date: 09/04/2017   ADDENDUM REPORT: 09/04/2017 03:15 ADDENDUM: Enteric catheter descends to the abdomen, tip overlying gastric cardia. It is not certain whether  this side hole is within the distal esophagus or proximal stomach. Electronically Signed   By: Ted Mcalpine M.D.   On: 09/04/2017 03:15   Result Date: 09/04/2017 CLINICAL DATA:  NG tube placement. EXAM: PORTABLE ABDOMEN - 1 VIEW COMPARISON:  None. FINDINGS: The bowel gas pattern is normal. No radio-opaque calculi or organomegaly is seen. Enteric catheter is not seen. Shock pads  overlie the upper abdomen. IMPRESSION: Enteric catheter is not seen. Electronically Signed: By: Ted Mcalpine M.D. On: 09/04/2017 02:42   Dg C-arm 1-60 Min  Result Date: 09/03/2017 CLINICAL DATA:  41 year old male for C6-7 fusion. Subsequent encounter. EXAM: DG C-ARM 61-120 MIN; CERVICAL SPINE - 2-3 VIEW COMPARISON:  08/28/2017 plain film exam.  07/13/2017 MR. Fluoroscopic time: 19 seconds. FINDINGS: Three C-arm views submitted for review. Anterior discectomy with plate and screws in place lower cervical spine. Lateral view does not include the dens and therefore not able to evaluate level properly. This can be assessed on follow-up. Frontal view suggestive of this representing the C6-7 level. IMPRESSION: Fusion lower cervical spine possibly C6-7 level. Lateral view including the dens would prove helpful for further delineation. These results will be called to the ordering clinician or representative by the Radiologist Assistant, and communication documented in the PACS or zVision Dashboard. Electronically Signed   By: Lacy Duverney M.D.   On: 09/03/2017 14:30     STUDIES:  CT soft tissues neck 12/4 >>> hematoma at surgical site.  CULTURES: Sputum cx 12/3 >  ANTIBIOTICS: Unasyn 12/3 >  SIGNIFICANT EVENTS: 12/3 admit, surgery, resp>cardiac arrest 20 mins. Intubated, to ICU  LINES/TUBES: ETT 12/3 >  DISCUSSION: 41 year old male presented for elective C-spine surgery. No overt complictaions, in post of setting developed respiratory distress subsequently cardiac arrest. 20 mins PEA. Purposeful somewhat post-op.   ASSESSMENT / PLAN:  PULMONARY A: Vent dependent respiratory failure secondary to PEA arrest with suspected respiratory etiology. Pulmonary edema vs aspiration PNA.   P:   Full vent support ABG repeat 09/04/2017 CXR as needed VAP bundle Lasix 20mg  given SBT currently on FiO2 of 50% therefore does not meet the criteria for weaning. Spontaneous breathing trial follows  commands.  CARDIOVASCULAR A:  PEA arrest > likely respiratory etiology  P:  Telemetry monitoring Trend troponin 09/03/2017 was 0.05 and on 09/04/2017 0425 was 0.15 May need pressors ECG pending Echocardiogram pending 09/04/2017 follows commands moves all extremities x4, despite being sedated with propofol fentanyl able to nod appropriately.  RENAL Lab Results  Component Value Date   CREATININE 1.98 (H) 09/03/2017   CREATININE 2.02 (H) 09/03/2017   CREATININE 1.17 09/03/2017   CREATININE 1.30 08/10/2017   Recent Labs  Lab 09/03/17 1014 09/03/17 2115 09/03/17 2226  K 3.6 4.6 5.0    A:   Renal insufficiency post code  P:   Monitor creatinine Avoid nephrotoxins  GASTROINTESTINAL A:   No acute issues  P:   NPO PPI  HEMATOLOGIC Recent Labs    09/03/17 1014 09/03/17 2115  HGB 15.7 14.3    A:   No acuate issues  P:  Follow H&H Repeat coags  INFECTIOUS A:   ? Aspiration  P:   ABX as above Follow cultures   ENDOCRINE CBG (last 3)  Recent Labs    09/03/17 2046  GLUCAP 255*    A:   Hyperglycemia  P:   Initiate sliding scale insulin  NEUROLOGIC A:   Acute metabolic vs anoxic encephalopathy Cervical spondylosis:  S/p repair 12/3  P:  RASS goal: -1 to -2 Propofol infusion Fentanyl infusion Monitor blood pressure while on propofol CT soft tissue neck.  09/04/2017 shows changes in the anterior cervical fixation C6-7 extensive hyperdense collection fullness throughout the prevertebral/retropharyngeal space.   FAMILY  - Updates: Family updated by Dr. Ophelia CharterYates  - Inter-disciplinary family meet or Palliative Care meeting due by:  12/10  - app cct 30 min    Brett CanalesSteve Minor ACNP [facility] Pager [phone] till 1 pm If no answer page [phone] 09/04/2017, 9:49 AM

## 2017-09-05 ENCOUNTER — Inpatient Hospital Stay (HOSPITAL_COMMUNITY): Payer: Self-pay

## 2017-09-05 LAB — BASIC METABOLIC PANEL
ANION GAP: 11 (ref 5–15)
BUN: 10 mg/dL (ref 6–20)
CO2: 26 mmol/L (ref 22–32)
Calcium: 7.8 mg/dL — ABNORMAL LOW (ref 8.9–10.3)
Chloride: 102 mmol/L (ref 101–111)
Creatinine, Ser: 1.35 mg/dL — ABNORMAL HIGH (ref 0.61–1.24)
GFR calc Af Amer: >60 mL/min (ref >60)
GFR calc non Af Amer: >60 mL/min (ref >60)
GLUCOSE: 110 mg/dL — AB (ref 65–99)
POTASSIUM: 3 mmol/L — AB (ref 3.5–5.1)
Sodium: 139 mmol/L (ref 135–145)

## 2017-09-05 LAB — GLUCOSE, CAPILLARY
GLUCOSE-CAPILLARY: 104 mg/dL — AB (ref 65–99)
GLUCOSE-CAPILLARY: 114 mg/dL — AB (ref 65–99)
GLUCOSE-CAPILLARY: 128 mg/dL — AB (ref 65–99)
Glucose-Capillary: 104 mg/dL — ABNORMAL HIGH (ref 65–99)
Glucose-Capillary: 125 mg/dL — ABNORMAL HIGH (ref 65–99)
Glucose-Capillary: 124 mg/dL — ABNORMAL HIGH (ref 65–99)

## 2017-09-05 LAB — POCT I-STAT 3, ART BLOOD GAS (G3+)
BICARBONATE: 26.3 mmol/L (ref 20.0–28.0)
O2 SAT: 96 %
PO2 ART: 90 mmHg (ref 83.0–108.0)
TCO2: 28 mmol/L (ref 22–32)
pCO2 arterial: 53.1 mmHg — ABNORMAL HIGH (ref 32.0–48.0)
pH, Arterial: 7.306 — ABNORMAL LOW (ref 7.350–7.450)

## 2017-09-05 LAB — MAGNESIUM
MAGNESIUM: 1.8 mg/dL (ref 1.7–2.4)
Magnesium: 1.7 mg/dL (ref 1.7–2.4)

## 2017-09-05 LAB — PHOSPHORUS
PHOSPHORUS: 2.6 mg/dL (ref 2.5–4.6)
Phosphorus: 1.7 mg/dL — ABNORMAL LOW (ref 2.5–4.6)

## 2017-09-05 MED ORDER — PRO-STAT SUGAR FREE PO LIQD
30.0000 mL | Freq: Every day | ORAL | Status: DC
  Administered 2017-09-05 – 2017-09-11 (×28): 30 mL
  Filled 2017-09-05 (×22): qty 30

## 2017-09-05 MED ORDER — DEXMEDETOMIDINE HCL IN NACL 200 MCG/50ML IV SOLN
0.4000 ug/kg/h | INTRAVENOUS | Status: DC
  Administered 2017-09-05 (×2): 0.4 ug/kg/h via INTRAVENOUS
  Administered 2017-09-05: 0.5 ug/kg/h via INTRAVENOUS
  Administered 2017-09-05 (×2): 0.8 ug/kg/h via INTRAVENOUS
  Administered 2017-09-06: 0.5 ug/kg/h via INTRAVENOUS
  Filled 2017-09-05 (×6): qty 50

## 2017-09-05 MED ORDER — SENNOSIDES 8.8 MG/5ML PO SYRP
5.0000 mL | ORAL_SOLUTION | Freq: Every day | ORAL | Status: DC
  Administered 2017-09-05 – 2017-09-06 (×2): 5 mL
  Filled 2017-09-05 (×4): qty 5

## 2017-09-05 MED ORDER — DIPHENHYDRAMINE-ZINC ACETATE 2-0.1 % EX CREA
TOPICAL_CREAM | Freq: Every day | CUTANEOUS | Status: DC | PRN
  Filled 2017-09-05: qty 28

## 2017-09-05 MED ORDER — SODIUM CHLORIDE 0.9 % IV BOLUS (SEPSIS)
500.0000 mL | Freq: Once | INTRAVENOUS | Status: AC
  Administered 2017-09-05: 500 mL via INTRAVENOUS

## 2017-09-05 MED ORDER — VITAL HIGH PROTEIN PO LIQD
1000.0000 mL | ORAL | Status: DC
  Administered 2017-09-05 – 2017-09-10 (×7): 1000 mL
  Filled 2017-09-05 (×4): qty 1000

## 2017-09-05 MED ORDER — THIAMINE HCL 100 MG/ML IJ SOLN
100.0000 mg | Freq: Every day | INTRAMUSCULAR | Status: DC
  Administered 2017-09-05 – 2017-09-12 (×8): 100 mg via INTRAVENOUS
  Filled 2017-09-05 (×9): qty 2

## 2017-09-05 MED ORDER — POTASSIUM CHLORIDE 20 MEQ PO PACK
20.0000 meq | PACK | Freq: Three times a day (TID) | ORAL | Status: AC
  Administered 2017-09-05 (×3): 20 meq via ORAL
  Filled 2017-09-05 (×3): qty 1

## 2017-09-05 NOTE — Progress Notes (Signed)
Nutrition Follow-up  DOCUMENTATION CODES:   Obesity unspecified  INTERVENTION:   Vital High Protein @ 40 ml/hr (960 ml/day) 30 ml Prostat five times per day Provides: 1460 kcal, 159 grams protein, and 802 ml free water.    NUTRITION DIAGNOSIS:   Inadequate oral intake related to inability to eat as evidenced by NPO status. Ongoing   GOAL:   Patient will meet greater than or equal to 90% of their needs Progressing   MONITOR:   TF tolerance, I & O's  ASSESSMENT:   Pt with PMH of cervical spondylosis with radiculopathy admitted 12/3 for elective repair. Post op pt developed respiratory distress with cardiac arrest.   Pt discussed during ICU rounds and with RN. Per RN pt is now off propofol. Per MD notes pt has hx of significant ETOH use, possible risk of withdrawal.  Weight up 7 lb, pt is positive 4 L  Patient is remains intubated on ventilator support  Medications reviewed and include: miralax, KCl, senokot, thiamine  Labs reviewed: K+ 3 (L)   TF: Vital High Protein @ 10 ml/hr with 60 ml Prostat five times per day Provides: 1240 kcal, 171 grams protein, and 200 ml free water.   NUTRITION - FOCUSED PHYSICAL EXAM:    Most Recent Value  Orbital Region  No depletion  Upper Arm Region  No depletion  Thoracic and Lumbar Region  No depletion  Buccal Region  No depletion  Temple Region  No depletion  Clavicle Bone Region  No depletion  Clavicle and Acromion Bone Region  No depletion  Scapular Bone Region  No depletion  Dorsal Hand  No depletion  Patellar Region  No depletion  Anterior Thigh Region  No depletion  Posterior Calf Region  No depletion  Edema (RD Assessment)  None  Hair  Reviewed  Eyes  Unable to assess  Mouth  Unable to assess  Skin  Reviewed  Nails  Reviewed       Diet Order:  Diet NPO time specified  EDUCATION NEEDS:   No education needs have been identified at this time  Skin:  Skin Assessment: Skin Integrity Issues: Skin Integrity  Issues:: Incisions Incisions: neck  Last BM:  12/2  Height:   Ht Readings from Last 1 Encounters:  09/03/17 5\' 11"  (1.803 m)    Weight:   Wt Readings from Last 1 Encounters:  09/05/17 244 lb 14.9 oz (111.1 kg)    Ideal Body Weight:  78.1 kg  BMI:  Body mass index is 34.16 kg/m.  Estimated Nutritional Needs:   Kcal:  [id number]  Protein:  >156 grams   Fluid:  >1.5 L/day   Kendell BaneHeather Rayaan Lorah RD, LDN, CNSC [phone] Pager [phone] After Hours Pager

## 2017-09-05 NOTE — Progress Notes (Signed)
PULMONARY / CRITICAL CARE MEDICINE   Name: Vincent Davis MRN: [id number] DOB: 10/23/1975    ADMISSION DATE:  09/03/2017 CONSULTATION DATE:  09/03/2017  REFERRING MD:  Dr. Carolyne LittlesYates [facility]o  CHIEF COMPLAINT:  PEA arrest  HISTORY OF PRESENT ILLNESS:   [age] year old male with history of cervical spondylosis with radiculopathy. He presented 12/3 for elective repair under Dr. Ophelia CharterYates. The surgery was without complication. In the postoperative setting he was doing well and recovering as expected with the exception of some respiratory secretions. Was conversant with nurse. She left him to get pain medication and upon returning a few minutes later she found him unresponsive and pulseless. PEA. He was coded off and on for 20 mins, but there was eventual return of spontaneous circulation that endured. He was transferred to ICU.  EKG showed no ischemic changes and there was no significant bump in his troponin.  Request CT scan of the neck shows dense of soft tissue swelling in the prevertebral space above the surgical repair and it appears that the PEA arrest was respiratory in origin secondary to airway compromise.  At this morning he is alert and interactive, he remains intubated and mechanically ventilated.    SUBJECTIVE:  Follows commands despite being sedated.  Remains on 50% FiO2  VITAL SIGNS: BP (!) 103/57   Pulse 71   Temp 98.7 F (37.1 C) (Axillary)   Resp (!) 22   Ht 5\' 11"  (1.803 m)   Wt 244 lb 14.9 oz (111.1 kg)   SpO2 100%   BMI 34.16 kg/m   HEMODYNAMICS:    VENTILATOR SETTINGS: Vent Mode: PRVC FiO2 (%):  [40 %] 40 % Set Rate:  [22 bmp] 22 bmp Vt Set:  [620 mL] 620 mL PEEP:  [5 cmH20] 5 cmH20 Pressure Support:  [18 cmH20] 18 cmH20 Plateau Pressure:  [23 cmH20-27 cmH20] 23 cmH20  INTAKE / OUTPUT: I/O last 3 completed shifts: In: 4179.9 [I.V.:3539.9; NG/GT:140; IV Piggyback:500] Out: 1495 [Urine:1280; Drains:15; Other:200]  PHYSICAL EXAMINATION: General: Well-nourished  well-developed male currently on ventilator. HEENT: Endotracheal tube to vent, anterior cervical incisions are clean dry and intact Neuro: Moves all extremities x4 to command.  Nods to questions appropriately.  Is sedated with propofol and fentanyl currently. CV: s1s2 rrr, no m/r/g PULM: Unlabored on PS 18.  There is symmetric air movement and no wheezes.  Decreased breath sounds in the bases.  Currently on 50% FiO2. WU:JWJXGI:soft, non-tender, bsx4 active  Extremities: warm/dry, 1+ edema  Skin: no rashes or lesions   LABS:  BMET Recent Labs  Lab 09/03/17 2226 09/04/17 0957 09/05/17 0425  NA 134* 137 139  K 5.0 3.6 3.0*  CL 95* 102 102  CO2 15* 24 26  BUN 9 7 10   CREATININE 1.98* 1.18 1.35*  GLUCOSE 327* 127* 110*    Electrolytes Recent Labs  Lab 09/03/17 2226 09/04/17 0425 09/04/17 0957 09/04/17 1538 09/05/17 0425  CALCIUM 9.4  --  8.3*  --  7.8*  MG  --  2.0  --  1.9 1.7  PHOS  --  3.0  --  3.0 1.7*    CBC Recent Labs  Lab 09/03/17 1014 09/03/17 2115  WBC 12.4* 24.2*  HGB 15.7 14.3  HCT 47.4 45.8  PLT 312 271    Coag's Recent Labs  Lab 09/03/17 2226  INR 1.01    Sepsis Markers Recent Labs  Lab 09/03/17 2226  LATICACIDVEN 9.6*    ABG Recent Labs  Lab 09/03/17 2133 09/04/17 1125  PHART 7.055* 7.273*  PCO2ART 50.1* 60.8*  PO2ART 172.0* 106    Liver Enzymes Recent Labs  Lab 09/03/17 2115  AST 65*  ALT 33  ALKPHOS 44  BILITOT 0.7  ALBUMIN 3.6    Cardiac Enzymes Recent Labs  Lab 09/04/17 0957 09/04/17 1538 09/04/17 2243  TROPONINI 0.09* 0.07* 0.06*    Glucose Recent Labs  Lab 09/03/17 2046 09/04/17 1554 09/04/17 2025 09/04/17 2325 09/05/17 0357 09/05/17 0828  GLUCAP 255* 116* 132* 120* 104* 104*    Imaging Dg Chest Port 1 View  Result Date: 09/05/2017 CLINICAL DATA:  Respiratory failure EXAM: PORTABLE CHEST 1 VIEW COMPARISON:  09/04/2017 FINDINGS: Endotracheal tube and nasogastric catheter are again seen and stable.  Cardiac shadow is stable. The lungs are well aerated with mild right basilar atelectasis. No definitive pneumothorax is seen. No bony abnormality is noted. IMPRESSION: Mild right basilar atelectasis. Electronically Signed   By: Alcide CleverMark  Lukens M.D.   On: 09/05/2017 08:10     STUDIES:  CT soft tissues neck 12/4 >>> hematoma at surgical site.  Chest x-ray to my eye today shows a well-placed endotracheal tube and some haziness at the  right base.  The NG tube projects well below the diaphragm.  CULTURES: Sputum cx 12/3 >  ANTIBIOTICS: Unasyn 12/3 >  SIGNIFICANT EVENTS: 12/3 admit, surgery, resp>cardiac arrest 20 mins. Intubated, to ICU  LINES/TUBES: ETT 12/3 >  DISCUSSION: 41 year old male presented for elective C-spine surgery. No overt complictaions, in post of setting developed respiratory distress subsequently cardiac arrest. 20 mins PEA.  Arrest appears to be on the basis of airway compromise.  He is alert and appropriate following the arrest.  ASSESSMENT / PLAN:  PULMONARY A: Vent dependent respiratory failure secondary to PEA arrest with suspected respiratory etiology. The amount of prevertebral swelling on CT was impressive and I suspect it will be several days before we have a reliable negative airway.  P:   Full vent support ABG repeat 09/04/2017 CXR as needed VAP bundle Lasix 20mg  given  CARDIOVASCULAR A:  PEA arrest > likely respiratory etiology  P:  Telemetry monitoring Trend troponin 09/03/2017 was 0.05 and on 09/04/2017 0425 was 0.15 May need pressors ECG pending Echocardiogram pending 09/04/2017 follows commands moves all extremities x4, despite being sedated with propofol fentanyl able to nod appropriately.  RENAL Lab Results  Component Value Date   CREATININE 1.35 (H) 09/05/2017   CREATININE 1.18 09/04/2017   CREATININE 1.98 (H) 09/03/2017   CREATININE 1.30 08/10/2017   Recent Labs  Lab 09/03/17 2226 09/04/17 0957 09/05/17 0425  K 5.0 3.6 3.0*     A:   Renal insufficiency post code  P:   Monitor creatinine Avoid nephrotoxins  GASTROINTESTINAL A:   No acute issues  P:   NPO PPI  HEMATOLOGIC Recent Labs    09/03/17 1014 09/03/17 2115  HGB 15.7 14.3    A:   No acuate issues  P:  Follow H&H Repeat coags  INFECTIOUS A:   ? Aspiration  P:   ABX as above Follow cultures   ENDOCRINE CBG (last 3)  Recent Labs    09/04/17 2325 09/05/17 0357 09/05/17 0828  GLUCAP 120* 104* 104*    A:   Hyperglycemia  P:   Initiate sliding scale insulin  NEUROLOGIC A:   He is alert and calmly interactive for me today.  Suspicion of a significant anoxic insult is essentially now.  Pertinent to his cervical spine disease, he is moving his upper extremities  on request.  There is a question of whether or not he has significant alcohol abuse, I have initiated thiamine and will be switching his sedation to Precedex. Cervical spondylosis:  S/p repair 12/3    Penny Pia, MD [facility] Pager [phone] till 1 pm If no answer page [phone]- [phone] 09/05/2017, 9:02 AM

## 2017-09-05 NOTE — Progress Notes (Addendum)
   Subjective: 2 Days Post-Op Procedure(s) (LRB): CERVICAL SIX-SEVEN  ANTERIOR CERVICAL DISCECTOMY AND FUSION, ALLOGRAFT, PLATE (N/A) Patient intubated and sedated. Writing responses at times. MAE. Had extra response with enema and now low potassium , mag. ICU doc to correct. Repeat blood gas today.    Objective: Vital signs in last 24 hours: Temp:  [98.3 F (36.8 C)-99.4 F (37.4 C)] 98.7 F (37.1 C) (12/05 0400) Pulse Rate:  [71-110] 71 (12/05 0800) Resp:  [17-23] 22 (12/05 0800) BP: (65-130)/(52-82) 103/57 (12/05 0800) SpO2:  [95 %-100 %] 100 % (12/05 0800) FiO2 (%):  [40 %] 40 % (12/05 0304) Weight:  [244 lb 14.9 oz (111.1 kg)] 244 lb 14.9 oz (111.1 kg) (12/05 0446)  Intake/Output from previous day: 12/04 0701 - 12/05 0700 In: 3386.7 [I.V.:2746.7; NG/GT:140; IV Piggyback:500] Out: 695 [Urine:680; Drains:15] Intake/Output this shift: Total I/O In: 134.4 [I.V.:114.4; NG/GT:20] Out: 60 [Urine:60]  Recent Labs    09/03/17 1014 09/03/17 2115  HGB 15.7 14.3   Recent Labs    09/03/17 1014 09/03/17 2115  WBC 12.4* 24.2*  RBC 5.20 4.83  HCT 47.4 45.8  PLT 312 271   Recent Labs    09/04/17 0957 09/05/17 0425  NA 137 139  K 3.6 3.0*  CL 102 102  CO2 24 26  BUN 7 10  CREATININE 1.18 1.35*  GLUCOSE 127* 110*  CALCIUM 8.3* 7.8*   Recent Labs    09/03/17 2226  INR 1.01    intubated, sedated. MAE to command.  Dg Chest Port 1 View  Result Date: 09/05/2017 CLINICAL DATA:  Respiratory failure EXAM: PORTABLE CHEST 1 VIEW COMPARISON:  09/04/2017 FINDINGS: Endotracheal tube and nasogastric catheter are again seen and stable. Cardiac shadow is stable. The lungs are well aerated with mild right basilar atelectasis. No definitive pneumothorax is seen. No bony abnormality is noted. IMPRESSION: Mild right basilar atelectasis. Electronically Signed   By: Alcide CleverMark  Lukens M.D.   On: 09/05/2017 08:10    Assessment/Plan: 2 Days Post-Op Procedure(s) (LRB): CERVICAL SIX-SEVEN   ANTERIOR CERVICAL DISCECTOMY AND FUSION, ALLOGRAFT, PLATE (N/A) Plan:  Continue support, possible extubation tomorrow . Upright position to allow for resolution of swelling upper cervical region. He was difficult intubation.  CXR improved.  He has Hx of signif EtOH use in past and may be at risk for withdrawal possibly.   Eldred MangesMark C Lambert Jeanty 09/05/2017, 8:43 AM

## 2017-09-06 ENCOUNTER — Inpatient Hospital Stay (HOSPITAL_COMMUNITY): Payer: Self-pay

## 2017-09-06 LAB — BLOOD GAS, ARTERIAL
Acid-base deficit: 0.1 mmol/L (ref 0.0–2.0)
Bicarbonate: 23 mmol/L (ref 20.0–28.0)
DRAWN BY: 418751
FIO2: 40
LHR: 22 {breaths}/min
O2 Saturation: 99.4 %
PATIENT TEMPERATURE: 98.6
PCO2 ART: 30.9 mmHg — AB (ref 32.0–48.0)
PEEP/CPAP: 5 cmH2O
PH ART: 7.485 — AB (ref 7.350–7.450)
VT: 620 mL
pO2, Arterial: 129 mmHg — ABNORMAL HIGH (ref 83.0–108.0)

## 2017-09-06 LAB — BASIC METABOLIC PANEL
Anion gap: 6 (ref 5–15)
BUN: 14 mg/dL (ref 6–20)
CHLORIDE: 112 mmol/L — AB (ref 101–111)
CO2: 24 mmol/L (ref 22–32)
CREATININE: 0.95 mg/dL (ref 0.61–1.24)
Calcium: 8.2 mg/dL — ABNORMAL LOW (ref 8.9–10.3)
GFR calc Af Amer: >60 mL/min (ref >60)
GFR calc non Af Amer: >60 mL/min (ref >60)
GLUCOSE: 122 mg/dL — AB (ref 65–99)
Potassium: 3.2 mmol/L — ABNORMAL LOW (ref 3.5–5.1)
SODIUM: 142 mmol/L (ref 135–145)

## 2017-09-06 LAB — CBC WITH DIFFERENTIAL/PLATELET
Basophils Absolute: 0 10*3/uL (ref 0.0–0.1)
Basophils Relative: 0 %
EOS ABS: 0.3 10*3/uL (ref 0.0–0.7)
EOS PCT: 2 %
HCT: 30.9 % — ABNORMAL LOW (ref 39.0–52.0)
HEMOGLOBIN: 9.7 g/dL — AB (ref 13.0–17.0)
LYMPHS ABS: 2.3 10*3/uL (ref 0.7–4.0)
Lymphocytes Relative: 16 %
MCH: 29.2 pg (ref 26.0–34.0)
MCHC: 31.4 g/dL (ref 30.0–36.0)
MCV: 93.1 fL (ref 78.0–100.0)
MONOS PCT: 6 %
Monocytes Absolute: 0.8 10*3/uL (ref 0.1–1.0)
Neutro Abs: 10.6 10*3/uL — ABNORMAL HIGH (ref 1.7–7.7)
Neutrophils Relative %: 76 %
Platelets: 184 10*3/uL (ref 150–400)
RBC: 3.32 MIL/uL — ABNORMAL LOW (ref 4.22–5.81)
RDW: 14.6 % (ref 11.5–15.5)
WBC: 14 10*3/uL — ABNORMAL HIGH (ref 4.0–10.5)

## 2017-09-06 LAB — GLUCOSE, CAPILLARY
GLUCOSE-CAPILLARY: 134 mg/dL — AB (ref 65–99)
Glucose-Capillary: 109 mg/dL — ABNORMAL HIGH (ref 65–99)
Glucose-Capillary: 116 mg/dL — ABNORMAL HIGH (ref 65–99)
Glucose-Capillary: 116 mg/dL — ABNORMAL HIGH (ref 65–99)
Glucose-Capillary: 141 mg/dL — ABNORMAL HIGH (ref 65–99)

## 2017-09-06 LAB — TRIGLYCERIDES: Triglycerides: 125 mg/dL (ref <150)

## 2017-09-06 LAB — PHOSPHORUS: Phosphorus: 2.4 mg/dL — ABNORMAL LOW (ref 2.5–4.6)

## 2017-09-06 LAB — MAGNESIUM: Magnesium: 1.8 mg/dL (ref 1.7–2.4)

## 2017-09-06 MED ORDER — DEXMEDETOMIDINE HCL IN NACL 400 MCG/100ML IV SOLN
0.4000 ug/kg/h | INTRAVENOUS | Status: DC
  Administered 2017-09-06: 1.1 ug/kg/h via INTRAVENOUS
  Administered 2017-09-06: 0.5 ug/kg/h via INTRAVENOUS
  Administered 2017-09-06: 1.1 ug/kg/h via INTRAVENOUS
  Administered 2017-09-06: 1.2 ug/kg/h via INTRAVENOUS
  Administered 2017-09-06 – 2017-09-07 (×2): 1.1 ug/kg/h via INTRAVENOUS
  Administered 2017-09-07 (×3): 1 ug/kg/h via INTRAVENOUS
  Administered 2017-09-07 (×2): 1.1 ug/kg/h via INTRAVENOUS
  Administered 2017-09-07: 1 ug/kg/h via INTRAVENOUS
  Administered 2017-09-08: 1.2 ug/kg/h via INTRAVENOUS
  Administered 2017-09-08: 1 ug/kg/h via INTRAVENOUS
  Administered 2017-09-08 (×2): 1.2 ug/kg/h via INTRAVENOUS
  Administered 2017-09-08 (×2): 1 ug/kg/h via INTRAVENOUS
  Administered 2017-09-08 – 2017-09-09 (×6): 1.2 ug/kg/h via INTRAVENOUS
  Filled 2017-09-06 (×24): qty 100

## 2017-09-06 MED ORDER — POTASSIUM CHLORIDE 20 MEQ PO PACK
20.0000 meq | PACK | Freq: Every day | ORAL | Status: DC
  Administered 2017-09-06 – 2017-09-11 (×5): 20 meq via ORAL
  Filled 2017-09-06 (×5): qty 1

## 2017-09-06 MED ORDER — FUROSEMIDE 10 MG/ML IJ SOLN
10.0000 mg | Freq: Every day | INTRAMUSCULAR | Status: DC
  Administered 2017-09-06 – 2017-09-07 (×2): 10 mg via INTRAVENOUS
  Filled 2017-09-06 (×2): qty 2

## 2017-09-06 MED ORDER — POTASSIUM CHLORIDE 10 MEQ/100ML IV SOLN
10.0000 meq | INTRAVENOUS | Status: AC
  Administered 2017-09-06 (×5): 10 meq via INTRAVENOUS
  Filled 2017-09-06 (×5): qty 100

## 2017-09-06 NOTE — Progress Notes (Signed)
PULMONARY / CRITICAL CARE MEDICINE   Name: Vincent Davis MRN: [id number] DOB: 11/27/1975    ADMISSION DATE:  09/03/2017 CONSULTATION DATE:  09/03/2017  REFERRING MD:  Dr. Carolyne L[facility]lesYates [facility]  CHIEF COMPLAINT:  PEA arrest  HISTORY OF PRESENT ILLNESS:   41 year old male with history of cervical spondylosis with radiculopathy. He presented 12/3 for elective repair under Dr. Ophelia CharterYates. The surgery was without complication. In the postoperative setting he was doing well and recovering as expected with the exception of some respiratory secretions. Was conversant with nurse. She left him to get pain medication and upon returning a few minutes later she found him unresponsive and pulseless. PEA. He was coded off and on for 20 mins, but there was eventual return of spontaneous circulation that endured. He was transferred to ICU.  EKG showed no ischemic changes and there was no significant bump in his troponin.  CT scan of the neck shows  soft tissue swelling in the prevertebral space above the surgical repair and it appears that the PEA arrest was respiratory in origin secondary to airway compromise.  This morning he is alert and interactive, he remains intubated and mechanically ventilated.    SUBJECTIVE:  Follows commands despite being sedated.  Remains on 50% FiO2  VITAL SIGNS: BP (!) 119/108   Pulse 82   Temp 99.2 F (37.3 C) (Oral)   Resp 12   Ht 5\' 11"  (1.803 m)   Wt 250 lb 3.6 oz (113.5 kg)   SpO2 100%   BMI 34.90 kg/m   HEMODYNAMICS:    VENTILATOR SETTINGS: Vent Mode: PSV;CPAP FiO2 (%):  [40 %] 40 % Set Rate:  [22 bmp] 22 bmp Vt Set:  [620 mL] 620 mL PEEP:  [5 cmH20] 5 cmH20 Pressure Support:  [5 cmH20] 5 cmH20 Plateau Pressure:  [24 cmH20-34 cmH20] 24 cmH20  INTAKE / OUTPUT: I/O last 3 completed shifts: In: 6698.4 [I.V.:4637.1; NG/GT:761.3; IV Piggyback:1300] Out: 1725 [Urine:1725]  PHYSICAL EXAMINATION: General: Well-nourished well-developed male currently on ventilator.  Calmly interactive and writing notes Neuro: Moves all extremities x4 to command.  Nods to questions appropriately.  Is sedated with propofol and fentanyl currently. CV: s1s2 rrr, no m/r/g PULM: Unlabored on PS 18.  There is symmetric air movement and no wheezes.  Decreased breath sounds in the bases.  Currently on 50% FiO2. WU:JWJXGI:soft, non-tender, bsx4 active  Extremities: warm/dry, 1+ edema  Skin: no rashes or lesions   LABS:  BMET Recent Labs  Lab 09/04/17 0957 09/05/17 0425 09/06/17 0448  NA 137 139 142  K 3.6 3.0* 3.2*  CL 102 102 112*  CO2 24 26 24   BUN 7 10 14   CREATININE 1.18 1.35* 0.95  GLUCOSE 127* 110* 122*    Electrolytes Recent Labs  Lab 09/04/17 0957  09/05/17 0425 09/05/17 1827 09/06/17 0448  CALCIUM 8.3*  --  7.8*  --  8.2*  MG  --    < > 1.7 1.8 1.8  PHOS  --    < > 1.7* 2.6 2.4*   < > = values in this interval not displayed.    CBC Recent Labs  Lab 09/03/17 1014 09/03/17 2115 09/06/17 0448  WBC 12.4* 24.2* 14.0*  HGB 15.7 14.3 9.7*  HCT 47.4 45.8 30.9*  PLT 312 271 184    Coag's Recent Labs  Lab 09/03/17 2226  INR 1.01    Sepsis Markers Recent Labs  Lab 09/03/17 2226  LATICACIDVEN 9.6*    ABG Recent Labs  Lab 09/04/17  1125 09/05/17 0930 09/06/17 0318  PHART 7.273* 7.306* 7.485*  PCO2ART 60.8* 53.1* 30.9*  PO2ART 106 90.0 129*    Liver Enzymes Recent Labs  Lab 09/03/17 2115  AST 65*  ALT 33  ALKPHOS 44  BILITOT 0.7  ALBUMIN 3.6    Cardiac Enzymes Recent Labs  Lab 09/04/17 0957 09/04/17 1538 09/04/17 2243  TROPONINI 0.09* 0.07* 0.06*    Glucose Recent Labs  Lab 09/05/17 1150 09/05/17 1543 09/05/17 1946 09/05/17 2330 09/06/17 0343 09/06/17 0809  GLUCAP 128* 125* 124* 114* 134* 116*    Imaging No results found.   STUDIES:  CT soft tissues neck 12/4 >>> hematoma at surgical site.  Chest x-ray to my eye today shows a well-placed endotracheal tube and some haziness at the  right base.  The NG tube  projects well below the diaphragm.  CULTURES: Sputum cx 12/3 >  ANTIBIOTICS: Unasyn 12/3 >  SIGNIFICANT EVENTS: 12/3 admit, surgery, resp>cardiac arrest 20 mins. Intubated, to ICU  LINES/TUBES: ETT 12/3 >  DISCUSSION: 41 year old male presented for elective C-spine surgery. No overt complictaions, in post of setting developed respiratory distress subsequently cardiac arrest. 20 mins PEA.  Arrest appears to be on the basis of airway compromise.  He is alert and appropriate following the arrest.  ASSESSMENT / PLAN:  PULMONARY A: Vent dependent respiratory failure secondary to PEA arrest with suspected respiratory etiology. The amount of prevertebral swelling on CT was impressive and I suspect it will be several days before we have a reliable negative airway.  P:   Full vent support ABG repeat 09/04/2017 CXR as needed VAP bundle Lasix 20mg  given  CARDIOVASCULAR A:  PEA arrest > likely respiratory etiology  P:  Telemetry monitoring Trend troponin 09/03/2017 was 0.05 and on 09/04/2017 0425 was 0.15 May need pressors ECG pending Echocardiogram pending 09/04/2017 follows commands moves all extremities x4, despite being sedated with propofol fentanyl able to nod appropriately. ENAL Lab Results  Component Value Date   CREATININE 0.95 09/06/2017   CREATININE 1.35 (H) 09/05/2017   CREATININE 1.18 09/04/2017   CREATININE 1.30 08/10/2017   Recent Labs  Lab 09/04/17 0957 09/05/17 0425 09/06/17 0448  K 3.6 3.0* 3.2*     HEMATOLOGIC Recent Labs    09/03/17 2115 09/06/17 0448  HGB 14.3 9.7*     ENDOCRINE CBG (last 3)  Recent Labs    09/05/17 2330 09/06/17 0343 09/06/17 0809  GLUCAP 114* 134* 116*    A:   Hyperglycemia  P:   Initiate sliding scale insulin  NEUROLOGIC A:   He is remains alert and calmly interactive. No evidence of EtOH wihdrawl or encephalopathy. Cervical spondylosis:  S/p repair 12/3  Airway compromise. To my eye he still has  substantial edema on today's CT. I am not willing to extubate with that picture. He is eight liters up from admission, will diurese to facilitate clearance of airway edema    Penny PiaWJ Milik Gilreath, MD [facility] Pager [phone] till 1 pm If no answer page [phone] 09/06/2017, 11:44 AM

## 2017-09-06 NOTE — Progress Notes (Signed)
Pharmacy Antibiotic Note  Vincent DyerSamuel Keith Davis is a 41 y.o. male admitted on 09/03/2017 with cervical fusion.  S/p PEA arrest with aspiration during code.  Pharmacy has been consulted for Unasyn dosing. Pt is afebrile and WBC is elevated at 14. SCr is WNL at 0.95 and dose remains appropriate.   Plan: Continue unasyn 3gm IV Q6H F/u renal fxn, C&S, clinical status and LOT *Pharmacy will sign off as not further dose adjustments are anticipated. Thank you for the consult!  Height: 5\' 11"  (180.3 cm) Weight: 250 lb 3.6 oz (113.5 kg) IBW/kg (Calculated) : 75.3  Temp (24hrs), Avg:98.5 F (36.9 C), Min:97.5 F (36.4 C), Max:99.2 F (37.3 C)  Recent Labs  Lab 09/03/17 1014 09/03/17 2115 09/03/17 2226 09/04/17 0957 09/05/17 0425 09/06/17 0448  WBC 12.4* 24.2*  --   --   --  14.0*  CREATININE 1.17 2.02* 1.98* 1.18 1.35* 0.95  LATICACIDVEN  --   --  9.6*  --   --   --     Estimated Creatinine Clearance: 131.1 mL/min (by C-G formula based on SCr of 0.95 mg/dL).    No Known Allergies  Antimicrobials this admission: Unasyn 12/3>>  Dose adjustments this admission: N/A  Microbiology results: 12/3 TA - pending 12/3 MRSA - NEG  Lysle Pearlachel Ashwath Lasch, PharmD, BCPS Phone #: [phone] until 3:30pm All other times, call Main Pharmacy x [phone] 09/06/2017 8:21 AM

## 2017-09-06 NOTE — Progress Notes (Signed)
Subjective: 3 Days Post-Op Procedure(s) (LRB): CERVICAL SIX-SEVEN  ANTERIOR CERVICAL DISCECTOMY AND FUSION, ALLOGRAFT, PLATE (N/A) Patient still intubated.  Objective: Vital signs in last 24 hours: Temp:  [97.5 F (36.4 C)-99.2 F (37.3 C)] 98.1 F (36.7 C) (12/06 1554) Pulse Rate:  [59-100] 75 (12/06 1700) Resp:  [12-29] 19 (12/06 1700) BP: (80-127)/(43-108) 119/77 (12/06 1700) SpO2:  [94 %-100 %] 100 % (12/06 1700) FiO2 (%):  [40 %] 40 % (12/06 1538) Weight:  [250 lb 3.6 oz (113.5 kg)] 250 lb 3.6 oz (113.5 kg) (12/06 0103)  Intake/Output from previous day: 12/05 0701 - 12/06 0700 In: 5044.5 [I.V.:3293.1; NG/GT:651.3; IV Piggyback:1100] Out: 1400 [Urine:1400] Intake/Output this shift: Total I/O In: 2582.7 [I.V.:1542.7; NG/GT:440; IV Piggyback:600] Out: 1850 [Urine:1850]  Recent Labs    09/03/17 2115 09/06/17 0448  HGB 14.3 9.7*   Recent Labs    09/03/17 2115 09/06/17 0448  WBC 24.2* 14.0*  RBC 4.83 3.32*  HCT 45.8 30.9*  PLT 271 184   Recent Labs    09/05/17 0425 09/06/17 0448  NA 139 142  K 3.0* 3.2*  CL 102 112*  CO2 26 24  BUN 10 14  CREATININE 1.35* 0.95  GLUCOSE 110* 122*  CALCIUM 7.8* 8.2*   Recent Labs    09/03/17 2226  INR 1.01    PE:  Moves to commands. Sedated and intubated.  Ct Soft Tissue Neck Wo Contrast  Result Date: 09/06/2017 CLINICAL DATA:  Followup prior soft tissue neck CT. EXAM: CT NECK WITHOUT CONTRAST TECHNIQUE: Multidetector CT imaging of the neck was performed following the standard protocol without intravenous contrast. COMPARISON:  Two days ago FINDINGS: Pharynx and larynx: Partially obscured by endotracheal and orogastric tubes. Salivary glands: Normal Thyroid: Normal Lymph nodes: No unexpected nodal enlargement in this setting. Vascular: No atheromatous changes. Limited intracranial: Negative Visualized orbits: Negative Mastoids and visualized paranasal sinuses: Moderate mucosal thickening in the ethmoids and sphenoid  sinuses. Orogastric tube through the left nostril. Skeleton: C6-7 ACDF with located ventral plate and intervertebral graft. Retropharyngeal edema and hemorrhage extending into the left neck. At the level of the larynx, there is a left eccentric hematoma measuring 5.4 by 2.5 cm by at least 8 cm. High-density hemorrhage tracks along the upper esophagus, making margins indistinct. Superimposed patchy edematous change in the deep and superficial neck that is progressed. Upper retropharyngeal effusion. A drain has been removed. Upper chest: Bilateral atelectasis. IMPRESSION: 1. Large retropharyngeal hematoma with progressive deep and superficial neck edema. Hemorrhage tracks along the upper thoracic esophagus. The hematoma has become more organized when compared to 2 days prior. 2. C6-7 ACDF hardware in good position. 3. Bilateral atelectasis. Electronically Signed   By: Marnee SpringJonathon  Watts M.D.   On: 09/06/2017 11:49    Assessment/Plan: 3 Days Post-Op Procedure(s) (LRB): CERVICAL SIX-SEVEN  ANTERIOR CERVICAL DISCECTOMY AND FUSION, ALLOGRAFT, PLATE (N/A) I agree with leaving tube per ICU MD, continue sitting up. Mag and  Phos replacement by ICU team. CXR better but still atelectasis. He had acute renal failure with  Bump in creatinine from 0.95 up to 2.02 and now dropped back to normal at 1.18 almost to baseline. This is from his respiratory arrest / hypoperfusion but has improved. GFR back to normal.   Thank you for great ICU care. Consider  A lateral c-spine xray with patient sitting up for a follow up in 2 days  since he has NG tube and ET tube is easy measurement to look at hematoma vs repeat CT . Can  be measured against previous lateral CT images and is very reliable .    Thank you again ICU nurses and team.  My cell [phone]  Eldred MangesMark C Yates 09/06/2017, 5:16 PM

## 2017-09-07 ENCOUNTER — Inpatient Hospital Stay (HOSPITAL_COMMUNITY): Payer: Self-pay | Admitting: Anesthesiology

## 2017-09-07 ENCOUNTER — Encounter (HOSPITAL_COMMUNITY)
Admission: RE | Disposition: A | Discharge status: Rehabilitation Facility | Payer: Self-pay | Source: Ambulatory Visit | Attending: Orthopaedic Surgery

## 2017-09-07 ENCOUNTER — Encounter (HOSPITAL_COMMUNITY): Payer: Self-pay | Admitting: Certified Registered Nurse Anesthetist

## 2017-09-07 ENCOUNTER — Inpatient Hospital Stay (HOSPITAL_COMMUNITY): Payer: Self-pay

## 2017-09-07 DIAGNOSIS — Z981 Arthrodesis status: Secondary | ICD-10-CM

## 2017-09-07 DIAGNOSIS — M96840 Postprocedural hematoma of a musculoskeletal structure following a musculoskeletal system procedure: Secondary | ICD-10-CM

## 2017-09-07 DIAGNOSIS — N179 Acute kidney failure, unspecified: Secondary | ICD-10-CM | POA: Diagnosis not present

## 2017-09-07 HISTORY — PX: EVACUATION OF CERVICAL HEMATOMA: SHX6695

## 2017-09-07 LAB — BASIC METABOLIC PANEL
ANION GAP: 9 (ref 5–15)
BUN: 14 mg/dL (ref 6–20)
CALCIUM: 8.5 mg/dL — AB (ref 8.9–10.3)
CO2: 21 mmol/L — ABNORMAL LOW (ref 22–32)
Chloride: 108 mmol/L (ref 101–111)
Creatinine, Ser: 1.01 mg/dL (ref 0.61–1.24)
GFR calc Af Amer: >60 mL/min (ref >60)
Glucose, Bld: 116 mg/dL — ABNORMAL HIGH (ref 65–99)
POTASSIUM: 3.5 mmol/L (ref 3.5–5.1)
SODIUM: 138 mmol/L (ref 135–145)

## 2017-09-07 LAB — CULTURE, RESPIRATORY W GRAM STAIN

## 2017-09-07 LAB — GLUCOSE, CAPILLARY
GLUCOSE-CAPILLARY: 116 mg/dL — AB (ref 65–99)
GLUCOSE-CAPILLARY: 110 mg/dL — AB (ref 65–99)
Glucose-Capillary: 114 mg/dL — ABNORMAL HIGH (ref 65–99)
Glucose-Capillary: 118 mg/dL — ABNORMAL HIGH (ref 65–99)
Glucose-Capillary: 107 mg/dL — ABNORMAL HIGH (ref 65–99)

## 2017-09-07 LAB — CULTURE, RESPIRATORY: CULTURE: NORMAL

## 2017-09-07 LAB — PHOSPHORUS: Phosphorus: 2.8 mg/dL (ref 2.5–4.6)

## 2017-09-07 SURGERY — EVACUATION OF CERVICAL HEMATOMA
Anesthesia: General

## 2017-09-07 MED ORDER — MIDAZOLAM HCL 2 MG/2ML IJ SOLN
INTRAMUSCULAR | Status: AC
  Filled 2017-09-07: qty 2

## 2017-09-07 MED ORDER — FENTANYL CITRATE (PF) 250 MCG/5ML IJ SOLN
INTRAMUSCULAR | Status: AC
  Filled 2017-09-07: qty 5

## 2017-09-07 MED ORDER — LACTATED RINGERS IV SOLN
INTRAVENOUS | Status: DC | PRN
  Administered 2017-09-07: 10:00:00 via INTRAVENOUS

## 2017-09-07 MED ORDER — OXYCODONE HCL 5 MG PO TABS: 5.0000 mg | ORAL_TABLET | Freq: Once | ORAL | Status: DC | PRN

## 2017-09-07 MED ORDER — CHLORHEXIDINE GLUCONATE 0.12% ORAL RINSE (MEDLINE KIT)
15.0000 mL | Freq: Two times a day (BID) | OROMUCOSAL | Status: DC
  Administered 2017-09-07 – 2017-09-13 (×14): 15 mL via OROMUCOSAL

## 2017-09-07 MED ORDER — FENTANYL CITRATE (PF) 100 MCG/2ML IJ SOLN
25.0000 ug | INTRAMUSCULAR | Status: AC | PRN
  Administered 2017-09-10 (×6): 50 ug via INTRAVENOUS
  Filled 2017-09-07 (×6): qty 2

## 2017-09-07 MED ORDER — SUGAMMADEX SODIUM 500 MG/5ML IV SOLN
INTRAVENOUS | Status: DC | PRN
  Administered 2017-09-07: 227 mg via INTRAVENOUS

## 2017-09-07 MED ORDER — FENTANYL CITRATE (PF) 100 MCG/2ML IJ SOLN
INTRAMUSCULAR | Status: DC | PRN
  Administered 2017-09-07: 50 ug via INTRAVENOUS

## 2017-09-07 MED ORDER — 0.9 % SODIUM CHLORIDE (POUR BTL) OPTIME
TOPICAL | Status: DC | PRN
  Administered 2017-09-07: 1000 mL

## 2017-09-07 MED ORDER — BUPIVACAINE-EPINEPHRINE (PF) 0.5% -1:200000 IJ SOLN
INTRAMUSCULAR | Status: AC
  Filled 2017-09-07: qty 30

## 2017-09-07 MED ORDER — BUPIVACAINE-EPINEPHRINE 0.5% -1:200000 IJ SOLN
INTRAMUSCULAR | Status: DC | PRN
  Administered 2017-09-07: 20 mL

## 2017-09-07 MED ORDER — OXYCODONE HCL 5 MG/5ML PO SOLN: 5.0000 mg | Freq: Once | ORAL | Status: DC | PRN

## 2017-09-07 MED ORDER — FUROSEMIDE 10 MG/ML IJ SOLN
20.0000 mg | Freq: Two times a day (BID) | INTRAMUSCULAR | Status: AC
  Administered 2017-09-07 – 2017-09-09 (×4): 20 mg via INTRAVENOUS
  Filled 2017-09-07 (×4): qty 2

## 2017-09-07 MED ORDER — DOCUSATE SODIUM 100 MG PO CAPS: 100.0000 mg | ORAL_CAPSULE | Freq: Two times a day (BID) | ORAL | Status: DC

## 2017-09-07 MED ORDER — POTASSIUM CHLORIDE 20 MEQ/15ML (10%) PO SOLN
40.0000 meq | Freq: Once | ORAL | Status: AC
  Administered 2017-09-07: 40 meq via ORAL
  Filled 2017-09-07: qty 30

## 2017-09-07 MED ORDER — ORAL CARE MOUTH RINSE
15.0000 mL | OROMUCOSAL | Status: DC
  Administered 2017-09-07 – 2017-09-09 (×16): 15 mL via OROMUCOSAL

## 2017-09-07 MED ORDER — MIDAZOLAM HCL 2 MG/2ML IJ SOLN
1.0000 mg | INTRAMUSCULAR | Status: DC | PRN
  Administered 2017-09-07 (×2): 2 mg via INTRAVENOUS
  Administered 2017-09-07 – 2017-09-08 (×6): 4 mg via INTRAVENOUS
  Administered 2017-09-09: 2 mg via INTRAVENOUS
  Administered 2017-09-09 – 2017-09-10 (×3): 4 mg via INTRAVENOUS
  Administered 2017-09-10: 2 mg via INTRAVENOUS
  Administered 2017-09-10 – 2017-09-12 (×7): 4 mg via INTRAVENOUS
  Filled 2017-09-07: qty 4
  Filled 2017-09-07 (×2): qty 2
  Filled 2017-09-07 (×9): qty 4
  Filled 2017-09-07: qty 2
  Filled 2017-09-07 (×6): qty 4
  Filled 2017-09-07: qty 2

## 2017-09-07 MED ORDER — SUGAMMADEX SODIUM 500 MG/5ML IV SOLN
INTRAVENOUS | Status: AC
  Filled 2017-09-07: qty 5

## 2017-09-07 MED ORDER — ONDANSETRON HCL 4 MG/2ML IJ SOLN: 4.0000 mg | Freq: Four times a day (QID) | INTRAMUSCULAR | Status: DC | PRN

## 2017-09-07 MED ORDER — DEXAMETHASONE SODIUM PHOSPHATE 10 MG/ML IJ SOLN
INTRAMUSCULAR | Status: AC
  Filled 2017-09-07: qty 1

## 2017-09-07 MED ORDER — LIDOCAINE 2% (20 MG/ML) 5 ML SYRINGE
INTRAMUSCULAR | Status: AC
  Filled 2017-09-07: qty 5

## 2017-09-07 MED ORDER — ROCURONIUM BROMIDE 10 MG/ML (PF) SYRINGE
PREFILLED_SYRINGE | INTRAVENOUS | Status: DC | PRN
  Administered 2017-09-07: 50 mg via INTRAVENOUS

## 2017-09-07 MED ORDER — MAGNESIUM SULFATE 2 GM/50ML IV SOLN
2.0000 g | Freq: Once | INTRAVENOUS | Status: AC
  Administered 2017-09-07: 2 g via INTRAVENOUS
  Filled 2017-09-07: qty 50

## 2017-09-07 SURGICAL SUPPLY — 51 items
APL SKNCLS STERI-STRIP NONHPOA (GAUZE/BANDAGES/DRESSINGS) ×1
BENZOIN TINCTURE PRP APPL 2/3 (GAUZE/BANDAGES/DRESSINGS) ×3 IMPLANT
BLADE CLIPPER SURG (BLADE) IMPLANT
BUR ROUND FLUTED 4 SOFT TCH (BURR) IMPLANT
BUR ROUND FLUTED 4MM SOFT TCH (BURR)
CLOSURE STERI-STRIP 1/2X4 (GAUZE/BANDAGES/DRESSINGS) ×1
CLOSURE WOUND 1/2 X4 (GAUZE/BANDAGES/DRESSINGS)
CLSR STERI-STRIP ANTIMIC 1/2X4 (GAUZE/BANDAGES/DRESSINGS) ×1 IMPLANT
COLLAR CERV LO CONTOUR FIRM DE (SOFTGOODS) ×3 IMPLANT
COVER MAYO STAND STRL (DRAPES) ×3 IMPLANT
COVER SURGICAL LIGHT HANDLE (MISCELLANEOUS) ×3 IMPLANT
CRADLE DONUT ADULT HEAD (MISCELLANEOUS) ×3 IMPLANT
DRAPE C-ARM 42X72 X-RAY (DRAPES) ×1 IMPLANT
DRAPE HALF SHEET 40X57 (DRAPES) ×3 IMPLANT
DRAPE MICROSCOPE LEICA (MISCELLANEOUS) ×1 IMPLANT
DURAPREP 6ML APPLICATOR 50/CS (WOUND CARE) ×3 IMPLANT
ELECT COATED BLADE 2.86 ST (ELECTRODE) ×3 IMPLANT
ELECT REM PT RETURN 9FT ADLT (ELECTROSURGICAL) ×3
ELECTRODE REM PT RTRN 9FT ADLT (ELECTROSURGICAL) ×1 IMPLANT
EVACUATOR 1/8 PVC DRAIN (DRAIN) ×3 IMPLANT
GAUZE SPONGE 4X4 12PLY STRL (GAUZE/BANDAGES/DRESSINGS) ×3 IMPLANT
GAUZE SPONGE 4X4 12PLY STRL LF (GAUZE/BANDAGES/DRESSINGS) ×2 IMPLANT
GLOVE BIOGEL PI IND STRL 8 (GLOVE) ×2 IMPLANT
GLOVE BIOGEL PI INDICATOR 8 (GLOVE) ×4
GLOVE ORTHO TXT STRL SZ7.5 (GLOVE) ×6 IMPLANT
GOWN STRL REUS W/ TWL LRG LVL3 (GOWN DISPOSABLE) ×1 IMPLANT
GOWN STRL REUS W/ TWL XL LVL3 (GOWN DISPOSABLE) ×1 IMPLANT
GOWN STRL REUS W/TWL 2XL LVL3 (GOWN DISPOSABLE) ×3 IMPLANT
GOWN STRL REUS W/TWL LRG LVL3 (GOWN DISPOSABLE) ×3
GOWN STRL REUS W/TWL XL LVL3 (GOWN DISPOSABLE) ×3
HEAD HALTER (SOFTGOODS) ×1 IMPLANT
KIT BASIN OR (CUSTOM PROCEDURE TRAY) ×3 IMPLANT
KIT ROOM TURNOVER OR (KITS) ×3 IMPLANT
MANIFOLD NEPTUNE II (INSTRUMENTS) ×3 IMPLANT
NDL 25GX 5/8IN NON SAFETY (NEEDLE) ×1 IMPLANT
NEEDLE 25GX 5/8IN NON SAFETY (NEEDLE) ×3 IMPLANT
NS IRRIG 1000ML POUR BTL (IV SOLUTION) ×3 IMPLANT
PACK ORTHO CERVICAL (CUSTOM PROCEDURE TRAY) ×3 IMPLANT
PAD ARMBOARD 7.5X6 YLW CONV (MISCELLANEOUS) ×6 IMPLANT
PATTIES SURGICAL .5 X.5 (GAUZE/BANDAGES/DRESSINGS) ×2 IMPLANT
STRIP CLOSURE SKIN 1/2X4 (GAUZE/BANDAGES/DRESSINGS) ×1 IMPLANT
SURGIFLO W/THROMBIN 8M KIT (HEMOSTASIS) IMPLANT
SUT BONE WAX W31G (SUTURE) ×1 IMPLANT
SUT VIC AB 3-0 PS2 18 (SUTURE) ×3
SUT VIC AB 3-0 PS2 18XBRD (SUTURE) ×1 IMPLANT
SUT VIC AB 4-0 PS2 27 (SUTURE) ×3 IMPLANT
SYR BULB 3OZ (MISCELLANEOUS) ×3 IMPLANT
TAPE CLOTH SURG 4X10 WHT LF (GAUZE/BANDAGES/DRESSINGS) ×2 IMPLANT
TOWEL OR 17X24 6PK STRL BLUE (TOWEL DISPOSABLE) ×3 IMPLANT
TOWEL OR 17X26 10 PK STRL BLUE (TOWEL DISPOSABLE) ×3 IMPLANT
WATER STERILE IRR 1000ML POUR (IV SOLUTION) ×1 IMPLANT

## 2017-09-07 NOTE — Transfer of Care (Signed)
Immediate Anesthesia Transfer of Care Note  Patient: Hinton DyerSamuel Keith Ruperto  Procedure(s) Performed: EVACUATION OF NECK HEMATOMA (N/A )  Patient Location: 4N ICU  Anesthesia Type:General  Level of Consciousness: sedated and Patient remains intubated per anesthesia plan  Airway & Oxygen Therapy: Patient remains intubated per anesthesia plan and Patient placed on Ventilator (see vital sign flow sheet for setting)  Post-op Assessment: Report given to RN and Post -op Vital signs reviewed and stable  Post vital signs: Reviewed and stable  Last Vitals:  Vitals:   09/07/17 0845 09/07/17 0900  BP: 124/72 (!) 131/39  Pulse: 82 (!) 51  Resp: 18 (!) 28  Temp:    SpO2: 100% 100%    Last Pain:  Vitals:   09/07/17 0823  TempSrc: Axillary  PainSc:       Patients Stated Pain Goal: 2 (09/03/17 1030)  Complications: No apparent anesthesia complications

## 2017-09-07 NOTE — Op Note (Signed)
Preop diagnosis: Postop cervical hematoma after C6-7 anterior cervical fusion.  Postop diagnosis: Same  Procedure: Reexploration anterior cervical fusion incision with evacuation of hematoma.  Surgeon: Annell GreeningMark Thula Stewart MD  Assistant: Zonia KiefJames Owens PA-C medically necessary and present for the entire procedure  Anesthesia: General anesthesia patient came from the ICU and was already intubated.  Drains: One Hemovac.  Findings: Hematoma evacuated 150 cc old dark blood.  No active bleeding sites.  Brief history: This 41 year old male with  underwent single level anterior cervical discectomy and fusion at C6-7 for spondylosis and chronic C7 radiculopathy with positive EMGs and nerve conduction velocities and failed conservative treatment for several months.  Postoperatively patient had a sudden respiratory arrest with extremely difficult intubation requiring CPR for 20 minutes and attempts with a Hyacinth MeekerMiller 2, Miller 3 and then finally intubation with fiberoptic glide scope in order to establish an airway.  Despite sedation patient was biting on the tube with difficult ventilation.  Concern for possible aspiration with improvement over the last 2 days of chest x-ray suggesting atelectasis and likely mucous plug since patient was coughing productive white material postoperatively.  Chest x-ray is improved EKG was normal no evidence of cardiac problems.  Post intubation neck CT showed an 8.8 x 5.1 x 9.2 cm hematoma that was actually high and 2-3 levels above the surgery site.  Follow-up CT yesterday showed 5.4 x 2.5 x 8 cm hematoma in the same area and patient developed some old bloody drainage from his incision last night.  At the bedside this morning I expressed some further dark blood from the midportion of the incision and he was brought back for evacuation of hematoma with concern that he could possibly develop infection with a draining hematoma as well as decompressing the hematoma would help with his airway  weaning.  Patient was already in the process of weaning but it not been extubated since hematoma was present there was concern with extremely difficult airway and considerable amount of swelling in his tongue with difficult intubation and cord swelling noted when the tube was finally placed that reintubation might be extremely difficult.  Patient has been on 2 drips for sedation including fentanyl and has had problems with postoperative urinary retention and had a Foley placed at the time of his coding had it removed and then had to have the Foley catheter replaced.  His hemoglobin preop was 14.3 and was 9.7 currently.  Procedure: After placed on the operating table patient's already on antibiotics prophylactically for potential pneumonia and no additional antibiotics were needed since some was just infused.  Timeout procedure was completed soft cervical collar was removed neck was prepped with DuraPrep there squared with towels and Betadine Steri-Drape was applied usual sterile male standard at the head and thyroid sheet.  Subcuticular suture was cut after initially placing a small hemostat in the midportion of the incision where he had had some the bloody drainage spreading and then cutting the subcuticular closure.  Platysma was divided there was some old bloody drainage present and this extended cephalad from the plate.  There are no active bleeding sites were seen in some old blood and some small blood clots were suctioned.  Copious irrigation was performed careful inspection on each side of the wound as had been done at the time of initial closure for his original procedure a few days ago was reperformed and there was no active bleeding.  Plate and screws were in good position.  No bleeding from the longus Coley muscle  and patient had no superficial vein that were bleeding at the time of surgery and the only finding was some old blood.  Sucker tip was placed on top vertebral body followed cephalad up to C3  and some additional blood was suctioned.  No blood was noted distally and at the time of surgery fingertip had been introduced caudally down over the vertebral body toward the chest and there was no bleeding in this region.  Repeat irrigation with a liter of saline was performed repeat inspection on all areas and there was no bleeding noted.  The site was dry and all blood clots had been removed.  Mild pressure was applied on the side of the neck inspection and still no bleeding was noted and no further clots.  Hemovac drain was placed within and out technique left down over the prevertebral bone and then reapproximation with Vicryl sutures in the platysma and then repeat skin closure.  Tincture benzoin Steri-Strips 4 x 4's tape and a soft collar was applied.  Patient tolerated the procedure well and was transferred back to the ICU still intubated.  Patient has been neurologically intact and has been moving all extremities since his intubation and as his sedation wears out he has been able to write notes and moves all extremities with good strength to command.

## 2017-09-07 NOTE — Progress Notes (Signed)
Patient ID: Vincent Davis, male   DOB: 07/30/1976, 41 y.o.   MRN: [id number]   Subjective: 4 Days Post-Op Procedure(s) (LRB): CERVICAL SIX-SEVEN  ANTERIOR CERVICAL DISCECTOMY AND FUSION, ALLOGRAFT, PLATE (N/A) Patient intubated and sedated . Can MAE to command.   Objective: Vital signs in last 24 hours: Temp:  [97.7 F (36.5 C)-99.2 F (37.3 C)] 97.7 F (36.5 C) (12/07 0400) Pulse Rate:  [55-100] 67 (12/07 0700) Resp:  [12-29] 16 (12/07 0700) BP: (94-127)/(60-108) 125/70 (12/07 0600) SpO2:  [94 %-100 %] 100 % (12/07 0700) FiO2 (%):  [40 %] 40 % (12/07 0411)  Intake/Output from previous day: 12/06 0701 - 12/07 0700 In: 4077.5 [I.V.:2937.5; NG/GT:440; IV Piggyback:700] Out: 3000 [Urine:3000] Intake/Output this shift: No intake/output data recorded.  Recent Labs    09/06/17 0448  HGB 9.7*   Recent Labs    09/06/17 0448  WBC 14.0*  RBC 3.32*  HCT 30.9*  PLT 184   Recent Labs    09/06/17 0448 09/07/17 0506  NA 142 138  K 3.2* 3.5  CL 112* 108  CO2 24 21*  BUN 14 14  CREATININE 0.95 1.01  GLUCOSE 122* 116*  CALCIUM 8.2* 8.5*   No results for input(s): LABPT, INR in the last 72 hours.  Dressing changed. Middle of incision has spot with some old blood that was expressed by pressure. New 4X4's applied. Old dark blood.  Ct Soft Tissue Neck Wo Contrast  Result Date: 09/06/2017 CLINICAL DATA:  Followup prior soft tissue neck CT. EXAM: CT NECK WITHOUT CONTRAST TECHNIQUE: Multidetector CT imaging of the neck was performed following the standard protocol without intravenous contrast. COMPARISON:  Two days ago FINDINGS: Pharynx and larynx: Partially obscured by endotracheal and orogastric tubes. Salivary glands: Normal Thyroid: Normal Lymph nodes: No unexpected nodal enlargement in this setting. Vascular: No atheromatous changes. Limited intracranial: Negative Visualized orbits: Negative Mastoids and visualized paranasal sinuses: Moderate mucosal thickening in the  ethmoids and sphenoid sinuses. Orogastric tube through the left nostril. Skeleton: C6-7 ACDF with located ventral plate and intervertebral graft. Retropharyngeal edema and hemorrhage extending into the left neck. At the level of the larynx, there is a left eccentric hematoma measuring 5.4 by 2.5 cm by at least 8 cm. High-density hemorrhage tracks along the upper esophagus, making margins indistinct. Superimposed patchy edematous change in the deep and superficial neck that is progressed. Upper retropharyngeal effusion. A drain has been removed. Upper chest: Bilateral atelectasis. IMPRESSION: 1. Large retropharyngeal hematoma with progressive deep and superficial neck edema. Hemorrhage tracks along the upper thoracic esophagus. The hematoma has become more organized when compared to 2 days prior. 2. C6-7 ACDF hardware in good position. 3. Bilateral atelectasis. Electronically Signed   By: Marnee SpringJonathon  Watts M.D.   On: 09/06/2017 11:49    Assessment/Plan: 4 Days Post-Op Procedure(s) (LRB): CERVICAL SIX-SEVEN  ANTERIOR CERVICAL DISCECTOMY AND FUSION, ALLOGRAFT, PLATE (N/A) Hematoma was 8.8 X 5.1 X 9.2 cm and on yesterday CT was max 5.4X 2.5 X 8 cm measured by radiologist.  Last night he had some old blood drain from his incision laterally  And dressing was changed. Today 744X4s with old dark blood.  Hgb is 9.7.  Option is  Return to OR for evacuation. of hematoma  Which may decrease hematoma size and decrease airway pressure.  He had normal blood loss for anterior cervical at surgery and inspection at  Closure time showed dry field, both sides checked, irrigated and rechecked. Discussed with his Mother plan for evacuation of hematoma  due to continued bloody drainage last 24 hrs. And she agrees.   Eldred MangesMark C Deysi Soldo 09/07/2017, 7:54 AM

## 2017-09-07 NOTE — Progress Notes (Signed)
Patient ID: Vincent DyerSamuel Keith Davis, male   DOB: 11/05/1975, 41 y.o.   MRN: [id number] Discussed with pt's brother and Mother planned surgery. They are letting other family members know about hematoma evacuation surgery this AM. Brother to let girlfriend know.

## 2017-09-07 NOTE — Interval H&P Note (Signed)
History and Physical Interval Note:  09/07/2017 9:35 AM  Vincent Davis  has presented today for surgery, with the diagnosis of hematoma  The various methods of treatment have been discussed with the patient and family. After consideration of risks, benefits and other options for treatment, the patient has consented to  Procedure(s): EVACUATION OF NECK HEMATOMA (N/A) as a surgical intervention .  The patient's history has been reviewed, patient examined, no change in status, stable for surgery.  I have reviewed the patient's chart and labs.  Questions were answered to the patient's satisfaction.     Eldred MangesMark C Ascher Schroepfer

## 2017-09-07 NOTE — Brief Op Note (Signed)
09/07/2017  10:26 AM  PATIENT:  Vincent Davis  41 y.o. male  PRE-OPERATIVE DIAGNOSIS:  hematoma  POST-OPERATIVE DIAGNOSIS:  hematoma  PROCEDURE:  Procedure(s): EVACUATION OF NECK HEMATOMA (N/A)  SURGEON:  Surgeon(s) and Role:    * Eldred MangesYates, Mark C, MD - Primary  PHYSICIAN ASSISTANT: Zonia Kiefjames Adelma Bowdoin pa-c    ANESTHESIA:   general  EBL:  150 mL   BLOOD ADMINISTERED:none  DRAINS: hemovac   SPECIMEN:  No Specimen  DISPOSITION OF SPECIMEN:  N/A  COUNTS:  YES  TOURNIQUET:  * No tourniquets in log *  DICTATION: .Dragon Dictation  PATIENT DISPOSITION:  PACU - hemodynamically stable.

## 2017-09-07 NOTE — Anesthesia Preprocedure Evaluation (Signed)
Anesthesia Evaluation  Patient identified by MRN, date of birth, ID band Patient awake    Reviewed: Allergy & Precautions, H&P , NPO status , Patient's Chart, lab work & pertinent test results  Airway Mallampati: Intubated       Dental   Pulmonary Current Smoker,  intubated   breath sounds clear to auscultation       Cardiovascular negative cardio ROS   Rhythm:regular Rate:Normal     Neuro/Psych S/p ACDF 09/03/17.  Now with neck hematoma  Neuromuscular disease    GI/Hepatic   Endo/Other  obese  Renal/GU      Musculoskeletal  (+) Arthritis ,   Abdominal   Peds  Hematology   Anesthesia Other Findings   Reproductive/Obstetrics                             Anesthesia Physical Anesthesia Plan  ASA: II  Anesthesia Plan: General   Post-op Pain Management:    Induction: Inhalational and Intravenous  PONV Risk Score and Plan: 1 and Ondansetron, Dexamethasone and Treatment may vary due to age or medical condition  Airway Management Planned: Oral ETT  Additional Equipment:   Intra-op Plan:   Post-operative Plan: Possible Post-op intubation/ventilation  Informed Consent: I have reviewed the patients History and Physical, chart, labs and discussed the procedure including the risks, benefits and alternatives for the proposed anesthesia with the patient or authorized representative who has indicated his/her understanding and acceptance.     Plan Discussed with: CRNA, Anesthesiologist and Surgeon  Anesthesia Plan Comments:         Anesthesia Quick Evaluation

## 2017-09-07 NOTE — H&P (View-Only) (Signed)
Patient ID: Vincent Davis, male   DOB: 12/24/1975, 41 y.o.   MRN: [id number]   Subjective: 4 Days Post-Op Procedure(s) (LRB): CERVICAL SIX-SEVEN  ANTERIOR CERVICAL DISCECTOMY AND FUSION, ALLOGRAFT, PLATE (N/A) Patient intubated and sedated . Can MAE to command.   Objective: Vital signs in last 24 hours: Temp:  [97.7 F (36.5 C)-99.2 F (37.3 C)] 97.7 F (36.5 C) (12/07 0400) Pulse Rate:  [55-100] 67 (12/07 0700) Resp:  [12-29] 16 (12/07 0700) BP: (94-127)/(60-108) 125/70 (12/07 0600) SpO2:  [94 %-100 %] 100 % (12/07 0700) FiO2 (%):  [40 %] 40 % (12/07 0411)  Intake/Output from previous day: 12/06 0701 - 12/07 0700 In: 4077.5 [I.V.:2937.5; NG/GT:440; IV Piggyback:700] Out: 3000 [Urine:3000] Intake/Output this shift: No intake/output data recorded.  Recent Labs    09/06/17 0448  HGB 9.7*   Recent Labs    09/06/17 0448  WBC 14.0*  RBC 3.32*  HCT 30.9*  PLT 184   Recent Labs    09/06/17 0448 09/07/17 0506  NA 142 138  K 3.2* 3.5  CL 112* 108  CO2 24 21*  BUN 14 14  CREATININE 0.95 1.01  GLUCOSE 122* 116*  CALCIUM 8.2* 8.5*   No results for input(s): LABPT, INR in the last 72 hours.  Dressing changed. Middle of incision has spot with some old blood that was expressed by pressure. New 4X4's applied. Old dark blood.  Ct Soft Tissue Neck Wo Contrast  Result Date: 09/06/2017 CLINICAL DATA:  Followup prior soft tissue neck CT. EXAM: CT NECK WITHOUT CONTRAST TECHNIQUE: Multidetector CT imaging of the neck was performed following the standard protocol without intravenous contrast. COMPARISON:  Two days ago FINDINGS: Pharynx and larynx: Partially obscured by endotracheal and orogastric tubes. Salivary glands: Normal Thyroid: Normal Lymph nodes: No unexpected nodal enlargement in this setting. Vascular: No atheromatous changes. Limited intracranial: Negative Visualized orbits: Negative Mastoids and visualized paranasal sinuses: Moderate mucosal thickening in the  ethmoids and sphenoid sinuses. Orogastric tube through the left nostril. Skeleton: C6-7 ACDF with located ventral plate and intervertebral graft. Retropharyngeal edema and hemorrhage extending into the left neck. At the level of the larynx, there is a left eccentric hematoma measuring 5.4 by 2.5 cm by at least 8 cm. High-density hemorrhage tracks along the upper esophagus, making margins indistinct. Superimposed patchy edematous change in the deep and superficial neck that is progressed. Upper retropharyngeal effusion. A drain has been removed. Upper chest: Bilateral atelectasis. IMPRESSION: 1. Large retropharyngeal hematoma with progressive deep and superficial neck edema. Hemorrhage tracks along the upper thoracic esophagus. The hematoma has become more organized when compared to 2 days prior. 2. C6-7 ACDF hardware in good position. 3. Bilateral atelectasis. Electronically Signed   By: Jonathon  Watts M.D.   On: 09/06/2017 11:49    Assessment/Plan: 4 Days Post-Op Procedure(s) (LRB): CERVICAL SIX-SEVEN  ANTERIOR CERVICAL DISCECTOMY AND FUSION, ALLOGRAFT, PLATE (N/A) Hematoma was 8.8 X 5.1 X 9.2 cm and on yesterday CT was max 5.4X 2.5 X 8 cm measured by radiologist.  Last night he had some old blood drain from his incision laterally  And dressing was changed. Today 4X4s with old dark blood.  Hgb is 9.7.  Option is  Return to OR for evacuation. of hematoma  Which may decrease hematoma size and decrease airway pressure.  He had normal blood loss for anterior cervical at surgery and inspection at  Closure time showed dry field, both sides checked, irrigated and rechecked. Discussed with his Mother plan for evacuation of hematoma   due to continued bloody drainage last 24 hrs. And she agrees.   Eldred MangesMark C Herbie Lehrmann 09/07/2017, 7:54 AM

## 2017-09-07 NOTE — Anesthesia Procedure Notes (Signed)
Date/Time: 09/07/2017 9:47 AM Performed by: Waynard EdwardsSmith, Santino Kinsella A, CRNA Pre-anesthesia Checklist: Patient identified, Emergency Drugs available, Suction available and Patient being monitored Patient Re-evaluated:Patient Re-evaluated prior to induction Oxygen Delivery Method: Circle system utilized Preoxygenation: Pre-oxygenation with 100% oxygen Induction Type: Inhalational induction with existing ETT Placement Confirmation: positive ETCO2 and breath sounds checked- equal and bilateral ETT to lip (cm): ICU tube holder. Dental Injury: Teeth and Oropharynx as per pre-operative assessment

## 2017-09-07 NOTE — Progress Notes (Signed)
PULMONARY / CRITICAL CARE MEDICINE   Name: Vincent Davis MRN: [id number] DOB: 04/12/1976    ADMISSION DATE:  09/03/2017 CONSULTATION DATE:  09/03/2017  REFERRING MD:  Vincent Davis Davis  CHIEF COMPLAINT:  PEA arrest  HISTORY OF PRESENT ILLNESS:   41 year old male with history of cervical spondylosis with radiculopathy. He presented 12/3 for elective repair under Dr. Ophelia Davis. The surgery was without complication. In the postoperative setting he was doing well and recovering as expected with the exception of some respiratory secretions. Was conversant with nurse. She left him to get pain medication and upon returning a few minutes later she found him unresponsive and pulseless. PEA. He was coded off and on for 20 mins, but there was eventual return of spontaneous circulation that endured. He was transferred to ICU.  EKG showed no ischemic changes and there was no significant bump in his troponin.  CT scan of the neck shows  soft tissue swelling in the prevertebral space above the surgical repair and it appears that the PEA arrest was respiratory in origin secondary to airway compromise.  This morning he is alert and interactive, he remains intubated and mechanically ventilated.    SUBJECTIVE:  Follows commands despite being sedated with fentanyl and Precedex.  Remains on 50% FiO2, somewhat agitated despite sedation.  VITAL SIGNS: BP (!) 172/69   Pulse (!) 50   Temp 99.3 F (37.4 C) (Axillary)   Resp (!) 22   Ht 5\' 11"  (1.803 m)   Wt 250 lb 3.6 oz (113.5 kg)   SpO2 99%   BMI 34.90 kg/m   HEMODYNAMICS:    VENTILATOR SETTINGS: Vent Mode: PRVC FiO2 (%):  [40 %] 40 % Set Rate:  [22 bmp] 22 bmp Vt Set:  [782[620 mL] 620 mL PEEP:  [5 cmH20] 5 cmH20 Pressure Support:  [5 cmH20] 5 cmH20 Plateau Pressure:  [23 cmH20-30 cmH20] 25 cmH20  INTAKE / OUTPUT: I/O last 3 completed shifts: In: 7324.9 [I.V.:4924.9; NG/GT:1400; IV Piggyback:1000] Out: 3850 [Urine:3850]  PHYSICAL  EXAMINATION: General: Well-nourished well-developed male currently on ventilator. Calmly interactive and writing notes, wearing cervical collar Neuro: Moves all extremities x4 to command.  Nods to questions appropriately.  Is sedated with fentanyl currently. Writing notes CV: s1s2 rrr, no m/r/g PULM: Unlabored on full vent support.  There is symmetric air movement and no wheezes few rhonchi.  Decreased breath sounds in the bases.  Currently on 40% FiO2. NF:AOZHGI:soft, non-tender,non-distended,  bsx4 active  Extremities: warm/dry, 1+ edema  Skin: intact, no rashes or lesions, warm and dry   LABS:  BMET Recent Labs  Lab 09/05/17 0425 09/06/17 0448 09/07/17 0506  NA 139 142 138  K 3.0* 3.2* 3.5  CL 102 112* 108  CO2 26 24 21*  BUN 10 14 14   CREATININE 1.35* 0.95 1.01  GLUCOSE 110* 122* 116*    Electrolytes Recent Labs  Lab 09/05/17 0425 09/05/17 1827 09/06/17 0448 09/07/17 0506  CALCIUM 7.8*  --  8.2* 8.5*  MG 1.7 1.8 1.8  --   PHOS 1.7* 2.6 2.4*  --     CBC Recent Labs  Lab 09/03/17 1014 09/03/17 2115 09/06/17 0448  WBC 12.4* 24.2* 14.0*  HGB 15.7 14.3 9.7*  HCT 47.4 45.8 30.9*  PLT 312 271 184    Coag's Recent Labs  Lab 09/03/17 2226  INR 1.01    Sepsis Markers Recent Labs  Lab 09/03/17 2226  LATICACIDVEN 9.6*    ABG Recent Labs  Lab 09/04/17 1125 09/05/17  0930 09/06/17 0318  PHART 7.273* 7.306* 7.485*  PCO2ART 60.8* 53.1* 30.9*  PO2ART 106 90.0 129*    Liver Enzymes Recent Labs  Lab 09/03/17 2115  AST 65*  ALT 33  ALKPHOS 44  BILITOT 0.7  ALBUMIN 3.6    Cardiac Enzymes Recent Labs  Lab 09/04/17 0957 09/04/17 1538 09/04/17 2243  TROPONINI 0.09* 0.07* 0.06*    Glucose Recent Labs  Lab 09/06/17 1553 09/06/17 1955 09/06/17 2331 09/07/17 0336 09/07/17 0824 09/07/17 1109  GLUCAP 116* 109* 141* 116* 114* 118*    Imaging No results found.   STUDIES:  CT Soft Tissue Neck: 12/6>> Large retropharyngeal hematoma with  progressive deep and superficial neck edema. Hemorrhage tracks along the upper thoracic esophagus. The hematoma has become more organized when compared to 2 days prior. C6-7 ACDF hardware in good position. Bilateral atelectasis.   CT soft tissues neck 12/4 >>> hematoma at surgical site.  Chest x-ray confirms  a well-placed endotracheal tube and some haziness at the  right base.  The NG tube projects well below the diaphragm.   CULTURES: Sputum cx 12/3 >  ANTIBIOTICS: Unasyn 12/3 >  SIGNIFICANT EVENTS: 12/3 admit, surgery, resp>cardiac arrest 20 mins. Intubated, to ICU  LINES/TUBES: ETT 12/3 >  DISCUSSION: 41 year old male presented for elective C-spine surgery. No overt complictaions, in post of setting developed respiratory distress subsequently cardiac arrest. 20 mins PEA.  Arrest appears to be on the basis of airway compromise.  He is alert and appropriate following the arrest.Large hematoma of the neck,  ASSESSMENT / PLAN:  PULMONARY A: Vent dependent respiratory failure secondary to PEA arrest with suspected respiratory etiology. The amount of prevertebral swelling on CT was impressive and I suspect it will be several days before we have a reliable negative airway.  P:   Full vent support ABG prn CXR 12/8 VAP bundle Increase  Lasix 20 mg daily Replete Mag  12/7 Will check phos  level 12/7 and replete if needed Mag and phos 12/8  CARDIOVASCULAR A:  PEA arrest > likely respiratory etiology Echo 09/04/2017>> EF 65-70% PAP 36 MM Hg  P:  Telemetry monitoring QTc monitoring Trend troponin 09/03/2017 was 0.05 and on 09/04/2017 0425 was 0.15 ECG prn  Echocardiogram as above  Neuro 09/07/2017 follows commands moves all extremities x4, despite being sedated with  Precedex and fentanyl able to nod appropriately. Plan Neuro checks per unit protocol  RENAL Lab Results  Component Value Date   CREATININE 1.01 09/07/2017   CREATININE 0.95 09/06/2017   CREATININE  1.35 (H) 09/05/2017   CREATININE 1.30 08/10/2017   Recent Labs  Lab 09/05/17 0425 09/06/17 0448 09/07/17 0506  K 3.0* 3.2* 3.5   Plan:  Replete electrolytes prn Trend BMET daily Monitor urine output Maintain renal perfusion Avoid nephrotoxic medications  HEMATOLOGIC Recent Labs    09/06/17 0448  HGB 9.7*     ENDOCRINE CBG (last 3)  Recent Labs    09/07/17 0336 09/07/17 0824 09/07/17 1109  GLUCAP 116* 114* 118*    A:   Hyperglycemia  P:   Continue sliding scale insulin CBG Q4  NEUROLOGIC A:   He is remains alert and calmly interactive. No evidence of EtOH wihdrawl or encephalopathy. Cervical spondylosis:  S/p repair 12/3 Neuro checks per unit protocol  Airway compromise.  Substantial edema on 12/6  CT. No plan to extubate until edema/ hematoma has resolved. He is 9  liters up from admission, will continue to diurese, and increase Lasix  to  facilitate clearance of airway edema.    Bevelyn Ngo, AGACNP-BC [facility] PCCM Pager [phone] till 1 pm If no answer page [phone] 09/07/2017, 12:11 PM

## 2017-09-07 NOTE — Progress Notes (Signed)
Cortrak Tube Team Note:  Consult received to place a Cortrak feeding tube.   A 10 F Cortrak tube was placed in the L nare and secured with a nasal bridle at 78 cm. Per the Cortrak monitor reading the tube tip is located at the distal stomach/pylorus   X-ray is required, abdominal x-ray has been ordered by the Cortrak team. Please confirm tube placement before using the Cortrak tube.   If the tube becomes dislodged please keep the tube and contact the Cortrak team at [email or web address] ([email or web address]) for replacement.  If after hours and replacement cannot be delayed, place a NG tube and confirm placement with an abdominal x-ray.   Christophe LouisNathan Ry Moody RD, LDN, CNSC Clinical Nutrition Pager: [phone] 09/07/2017 2:42 PM

## 2017-09-08 ENCOUNTER — Inpatient Hospital Stay (HOSPITAL_COMMUNITY): Payer: Self-pay

## 2017-09-08 DIAGNOSIS — N179 Acute kidney failure, unspecified: Secondary | ICD-10-CM

## 2017-09-08 LAB — CBC
HEMATOCRIT: 31.2 % — AB (ref 39.0–52.0)
HEMOGLOBIN: 9.9 g/dL — AB (ref 13.0–17.0)
MCH: 28.6 pg (ref 26.0–34.0)
MCHC: 31.7 g/dL (ref 30.0–36.0)
MCV: 90.2 fL (ref 78.0–100.0)
Platelets: 241 10*3/uL (ref 150–400)
RBC: 3.46 MIL/uL — ABNORMAL LOW (ref 4.22–5.81)
RDW: 13.6 % (ref 11.5–15.5)
WBC: 14.2 10*3/uL — ABNORMAL HIGH (ref 4.0–10.5)

## 2017-09-08 LAB — BASIC METABOLIC PANEL
Anion gap: 11 (ref 5–15)
BUN: 14 mg/dL (ref 6–20)
CHLORIDE: 106 mmol/L (ref 101–111)
CO2: 20 mmol/L — AB (ref 22–32)
CREATININE: 0.94 mg/dL (ref 0.61–1.24)
Calcium: 8.5 mg/dL — ABNORMAL LOW (ref 8.9–10.3)
GFR calc Af Amer: >60 mL/min (ref >60)
GFR calc non Af Amer: >60 mL/min (ref >60)
GLUCOSE: 118 mg/dL — AB (ref 65–99)
Potassium: 3.4 mmol/L — ABNORMAL LOW (ref 3.5–5.1)
Sodium: 137 mmol/L (ref 135–145)

## 2017-09-08 LAB — GLUCOSE, CAPILLARY
GLUCOSE-CAPILLARY: 118 mg/dL — AB (ref 65–99)
GLUCOSE-CAPILLARY: 122 mg/dL — AB (ref 65–99)
GLUCOSE-CAPILLARY: 128 mg/dL — AB (ref 65–99)
GLUCOSE-CAPILLARY: 133 mg/dL — AB (ref 65–99)
GLUCOSE-CAPILLARY: 148 mg/dL — AB (ref 65–99)
GLUCOSE-CAPILLARY: 170 mg/dL — AB (ref 65–99)
Glucose-Capillary: 111 mg/dL — ABNORMAL HIGH (ref 65–99)

## 2017-09-08 LAB — PROCALCITONIN: Procalcitonin: 0.16 ng/mL

## 2017-09-08 LAB — MAGNESIUM: Magnesium: 1.9 mg/dL (ref 1.7–2.4)

## 2017-09-08 LAB — TROPONIN I: Troponin I: <0.03 ng/mL (ref <0.03)

## 2017-09-08 LAB — PHOSPHORUS: Phosphorus: 2.2 mg/dL — ABNORMAL LOW (ref 2.5–4.6)

## 2017-09-08 MED ORDER — DOCUSATE SODIUM 50 MG/5ML PO LIQD
100.0000 mg | Freq: Two times a day (BID) | ORAL | Status: DC
  Administered 2017-09-08 – 2017-09-14 (×5): 100 mg
  Filled 2017-09-08 (×7): qty 10

## 2017-09-08 MED ORDER — POTASSIUM PHOSPHATES 15 MMOLE/5ML IV SOLN
30.0000 mmol | Freq: Once | INTRAVENOUS | Status: AC
  Administered 2017-09-08: 30 mmol via INTRAVENOUS
  Filled 2017-09-08: qty 10

## 2017-09-08 NOTE — Progress Notes (Signed)
Orthopedic Tech Progress Note Patient Details:  Hinton DyerSamuel Keith Summons 02/02/1976 [id number]  Ortho Devices Type of Ortho Device: Soft collar Ortho Device/Splint Location: neck Ortho Device/Splint Interventions: Application   Post Interventions Patient Tolerated: Well Instructions Provided: Care of device   Nikki DomCrawford, Zainah Steven 09/08/2017, 10:12 AM

## 2017-09-08 NOTE — Anesthesia Postprocedure Evaluation (Signed)
Anesthesia Post Note  Patient: Vincent Davis  Procedure(s) Performed: EVACUATION OF NECK HEMATOMA (N/A )     Patient location during evaluation: SICU Anesthesia Type: General Level of consciousness: sedated Pain management: pain level controlled Vital Signs Assessment: post-procedure vital signs reviewed and stable Respiratory status: patient remains intubated per anesthesia plan Cardiovascular status: stable Postop Assessment: no apparent nausea or vomiting Anesthetic complications: no    Last Vitals:  Vitals:   09/08/17 0800 09/08/17 0839  BP: 119/65 119/65  Pulse: (!) 56 78  Resp: (!) 5 (!) 24  Temp: 37.3 C   SpO2: 100% 100%    Last Pain:  Vitals:   09/08/17 0800  TempSrc: Oral  PainSc:                  Arlina Sabina S

## 2017-09-08 NOTE — Progress Notes (Signed)
     Subjective: 1 Day Post-Op Procedure(s) (LRB): EVACUATION OF NECK HEMATOMA (N/A) Awake and alert intubated with assisted venilation. He is moving all extremities purposefully. The dressing is dry and the hemovac is functioning with mild drainage. He responds to commands and understands the need to keep the ETT till swelling is improved. Able to clear secretions using a suction With the right hand.  Patient reports pain as mild.    Objective:   VITALS:  Temp:  [97.7 F (36.5 C)-99.1 F (37.3 C)] 98.5 F (36.9 C) (12/08 1200) Pulse Rate:  [51-102] 74 (12/08 1600) Resp:  [0-24] 23 (12/08 1549) BP: (94-139)/(48-81) 97/69 (12/08 1600) SpO2:  [96 %-100 %] 100 % (12/08 1600) FiO2 (%):  [30 %-40 %] 30 % (12/08 1549) Weight:  [261 lb 3.9 oz (118.5 kg)] 261 lb 3.9 oz (118.5 kg) (12/08 0500)  Neurologically intact ABD soft Neurovascular intact Sensation intact distally Intact pulses distally Dorsiflexion/Plantar flexion intact Incision: dressing C/D/I and scant drainage   LABS Recent Labs    09/06/17 0448 09/08/17 0329  HGB 9.7* 9.9*  WBC 14.0* 14.2*  PLT 184 241   Recent Labs    09/07/17 0506 09/08/17 0329  NA 138 137  K 3.5 3.4*  CL 108 106  CO2 21* 20*  BUN 14 14  CREATININE 1.01 0.94  GLUCOSE 116* 118*   No results for input(s): LABPT, INR in the last 72 hours.   Assessment/Plan: 1 Day Post-Op Procedure(s) (LRB): EVACUATION OF NECK HEMATOMA (N/A)  Continue ABX therapy due to drain present for patient with diagnosis of post operative precervical hematoma with loss of Airway due to swelling, now intubated awaiting decreased  Neck and soft tissue swelling to allow for successful safe extubation. Dr. Ophelia CharterYates requests drain remain in place left neck.   Vincent Davis 09/08/2017, 5:00 PMPatient ID: Vincent Davis, male   DOB: 03/14/1976, 41 y.o.   MRN: [id number]

## 2017-09-08 NOTE — Progress Notes (Addendum)
PULMONARY / CRITICAL CARE MEDICINE   Name: Vincent Davis MRN: [id number] DOB: 08/27/1976    ADMISSION DATE:  09/03/2017 CONSULTATION DATE:  09/03/2017  REFERRING MD:  Dr. Carolyne LittlesYates Ortho  CHIEF COMPLAINT:  PEA arrest  HISTORY OF PRESENT ILLNESS:   41 year old male with history of cervical spondylosis with radiculopathy. He presented 12/3 for elective repair under Dr. Ophelia CharterYates. The surgery was without complication. In the postoperative setting he was doing well and recovering as expected with the exception of some respiratory secretions. Was conversant with nurse. She left him to get pain medication and upon returning a few minutes later she found him unresponsive and pulseless. PEA. He was coded off and on for 20 mins, but there was eventual return of spontaneous circulation that endured. He was transferred to ICU.  EKG showed no ischemic changes and there was no significant bump in his troponin.  CT scan of the neck shows  soft tissue swelling in the prevertebral space above the surgical repair and it appears that the PEA arrest was respiratory in origin secondary to airway compromise.  This morning he is alert and interactive, he remains intubated and mechanically ventilated.    SUBJECTIVE:  Comfortable on pressure support ventilation no acute distress  VITAL SIGNS: BP (!) 110/56   Pulse 75   Temp 98.3 F (36.8 C) (Axillary)   Resp 17   Ht 5\' 11"  (1.803 m)   Wt 261 lb 3.9 oz (118.5 kg)   SpO2 100%   BMI 36.44 kg/m   HEMODYNAMICS:    VENTILATOR SETTINGS: Vent Mode: PRVC FiO2 (%):  [30 %-40 %] 30 % Set Rate:  [22 bmp] 22 bmp Vt Set:  [130[620 mL] 620 mL PEEP:  [5 cmH20] 5 cmH20 Pressure Support:  [5 cmH20] 5 cmH20 Plateau Pressure:  [13 cmH20-29 cmH20] 21 cmH20  INTAKE / OUTPUT:  Intake/Output Summary (Last 24 hours) at 09/08/2017 0819 Last data filed at 09/08/2017 0500 Gross per 24 hour  Intake 3086.49 ml  Output 5175 ml  Net -2088.51 ml     PHYSICAL  EXAMINATION: General: This 41 year old male patient currently on pressure support ventilation following prolonged intensive care stay after cardiac arrest.  He is awake, alert, writing notes. HEENT: Bilateral scleral hemorrhage noted.  Orally intubated.  Soft collar in place.  Has drain placed to anterior cervical disc dressing.  This was reexplored and evacuated on 12/7.  He has an intermittent air leak, however this is intermittent and not reliable currently. Pulmonary: Clear to auscultation with excellent tidal volume on pressure support of 5 Cardiac: Regular rate and rhythm without murmur rub or gallop Abdomen: Soft nontender no organomegaly positive bowel sounds tolerating tube feeds Extremities/musculoskeletal: Equal strength, brisk cap refill, strong pulses.  Does have edema in both hands and lower extremities Neuro: Awake, cooperative, moves all extremities.  No focal deficits.   LABS:  BMET Recent Labs  Lab 09/06/17 0448 09/07/17 0506 09/08/17 0329  NA 142 138 137  K 3.2* 3.5 3.4*  CL 112* 108 106  CO2 24 21* 20*  BUN 14 14 14   CREATININE 0.95 1.01 0.94  GLUCOSE 122* 116* 118*    Electrolytes Recent Labs  Lab 09/05/17 0425 09/05/17 1827 09/06/17 0448 09/07/17 0506 09/07/17 1432 09/08/17 0329  CALCIUM 7.8*  --  8.2* 8.5*  --  8.5*  MG 1.7 1.8 1.8  --   --   --   PHOS 1.7* 2.6 2.4*  --  2.8 2.2*    CBC  Recent Labs  Lab 09/03/17 2115 09/06/17 0448 09/08/17 0329  WBC 24.2* 14.0* 14.2*  HGB 14.3 9.7* 9.9*  HCT 45.8 30.9* 31.2*  PLT 271 184 241    Coag's Recent Labs  Lab 09/03/17 2226  INR 1.01    Sepsis Markers Recent Labs  Lab 09/03/17 2226  LATICACIDVEN 9.6*    ABG Recent Labs  Lab 09/04/17 1125 09/05/17 0930 09/06/17 0318  PHART 7.273* 7.306* 7.485*  PCO2ART 60.8* 53.1* 30.9*  PO2ART 106 90.0 129*    Liver Enzymes Recent Labs  Lab 09/03/17 2115  AST 65*  ALT 33  ALKPHOS 44  BILITOT 0.7  ALBUMIN 3.6    Cardiac  Enzymes Recent Labs  Lab 09/04/17 1538 09/04/17 2243 09/08/17 0329  TROPONINI 0.07* 0.06* <0.03    Glucose Recent Labs  Lab 09/07/17 1109 09/07/17 1542 09/07/17 2018 09/08/17 0019 09/08/17 0333 09/08/17 0727  GLUCAP 118* 110* 107* 111* 122* 118*    Imaging Dg Abd Portable 1v  Result Date: 09/07/2017 CLINICAL DATA:  Check feeding catheter placement EXAM: PORTABLE ABDOMEN - 1 VIEW COMPARISON:  None. FINDINGS: Scattered large and small bowel gas is noted. Feeding catheter is noted coiled within the stomach. No other focal abnormality is noted. IMPRESSION: Feeding catheter within the stomach. Electronically Signed   By: Alcide Clever M.D.   On: 09/07/2017 15:37     STUDIES:  CT Soft Tissue Neck: 12/6>> Large retropharyngeal hematoma with progressive deep and superficial neck edema. Hemorrhage tracks along the upper thoracic esophagus. The hematoma has become more organized when compared to 2 days prior. C6-7 ACDF hardware in good position. Bilateral atelectasis.   CT soft tissues neck 12/4 >>> hematoma at surgical site.  Chest x-ray confirms  a well-placed endotracheal tube and some haziness at the  right base.  The NG tube projects well below the diaphragm.   CULTURES: Sputum cx 12/3 > abundant white blood cells no organisms  ANTIBIOTICS: Unasyn 12/3 >  SIGNIFICANT EVENTS: 12/3 admit, surgery, resp>cardiac arrest 20 mins. Intubated, to ICU 12/7 re-exploration of neck, I&D of old hematoma. Drain left in place.   LINES/TUBES: ETT 12/3 >  DISCUSSION: 41 year old male presented for elective C-spine surgery. No overt complictaions, in post of setting developed respiratory distress subsequently cardiac arrest. 20 mins PEA.  Arrest appears to be on the basis of airway compromise.  He is alert and appropriate following the arrest.Large hematoma of the neck,, this is now been evacuated.  Tolerating pressure support ventilation.  He has a week and intermittent cuff leak.   Given what he has been through I think 1 more day of diuresis aiming for negative volume status will give [place] a much higher chance of success.  We will re-evaluate cuff leak again in a.m. hopefully ready for extubation soon   ASSESSMENT / PLAN:   Vent dependent respiratory failure secondary to PEA arrest with suspected respiratory etiology as a result of airway compromise +/-narcotics Possible aspiration pneumonia -Had significant amount of prevertebral swelling on CT now status post evacuation of hematoma which may help some. -Last chest x-ray on 12/6 demonstrated right basilar atelectasis, ET tube in satisfactory position.  This was personally reviewed. Plan Continuing ventilatory support and pressure support ventilation Repeat cuff leak in a.m., repeat Lasix, would like to see cuff leak stronger prior to considering extubation PAD protocol RASS: -1 VAP prevention Repeat chest x-ray a.m. Day #6 of 7 Unasyn  Low-grade fever and leukocytosis. -White blood cell holding around 14.  No  significant temp spikes since 12/7 Plan Continue to trend fever curve Repeat CBC in a.m. If spikes fever again would have a low threshold to pan culture as well as add nosocomial coverage. Will trend pro-calcitonin  PEA arrest > likely respiratory etiology Echo 09/04/2017>> EF 65-70% PAP 36 MM Hg Plan Continue telemetry monitoring   Cervical spondylosis:  S/p repair 12/3 via ACDF C6-7 c/b post op hematoma -Status post reexploration 12/7 with evacuation of hematoma Plan Continue routine neuro checks Wound/drain management per surgical services Hold subcu heparin or low molecular weight heparin  Mild hypokalemia and hypophosphatemia. Plan Replace and recheck.  Mild anemia.  No acute blood loss over last 48 hours. Plan Trend CBC Transfuse per protocol.  My cct 40 minutes  Simonne MartinetPeter E Babcock ACNP-BC [facility] Pul[facility]/Critical Care Pager # [phone] OR # [phone] if no answer  Attending  Note:  41 year old male s/p cervical repair that had a compromise of the airway post op and had a PEA cardiac arrest.  Patient was resuscitated and transferred to the ICU and PCCM was consulted.  On exam, patient is awake and weaning well.  I reviewed CXR myself, ETT is in good place.  Patient is weaning well but concern is the airway compromise.  Continue weaning, cuff leak in AM, if positive will consider extubation.  Replace electrolytes.  Continue light sedation.  Continue IVF.  Will re-evaluate again in AM.  The patient is critically ill with multiple organ systems failure and requires high complexity decision making for assessment and support, frequent evaluation and titration of therapies, application of advanced monitoring technologies and extensive interpretation of multiple databases.   Critical Care Time devoted to patient care services described in this note is  35  Minutes. This time reflects time of care of this signee Dr Koren BoundWesam Yazhini Davis. This critical care time does not reflect procedure time, or teaching time or supervisory time of PA/NP/Med student/Med Resident etc but could involve care discussion time.  Vincent ReedyWesam G. Keryn Nessler, M.D. [facility] Pulmonary/Critical Care Medicine. Pager: [phone]. After hours pager: [phone].

## 2017-09-08 NOTE — Progress Notes (Signed)
CSW received consult for SNF placement for this patient. CSW reviewed chart notes and contacted Consuella LoseElaine, RN to discuss. RN stated that the consult for SNF was entered by error and this patient will return home at discharge when he is medically ready.   CSW signing off.  Edwin Dadaarol Raychelle Hudman, MSW, LCSW-A Weekend Clinical Social Worker [phone]

## 2017-09-09 ENCOUNTER — Inpatient Hospital Stay (HOSPITAL_COMMUNITY): Payer: Self-pay

## 2017-09-09 DIAGNOSIS — J9601 Acute respiratory failure with hypoxia: Secondary | ICD-10-CM

## 2017-09-09 LAB — BASIC METABOLIC PANEL
ANION GAP: 10 (ref 5–15)
BUN: 16 mg/dL (ref 6–20)
CO2: 20 mmol/L — ABNORMAL LOW (ref 22–32)
Calcium: 8.1 mg/dL — ABNORMAL LOW (ref 8.9–10.3)
Chloride: 106 mmol/L (ref 101–111)
Creatinine, Ser: 0.87 mg/dL (ref 0.61–1.24)
Glucose, Bld: 133 mg/dL — ABNORMAL HIGH (ref 65–99)
POTASSIUM: 4.8 mmol/L (ref 3.5–5.1)
SODIUM: 136 mmol/L (ref 135–145)

## 2017-09-09 LAB — PROCALCITONIN: PROCALCITONIN: 0.1 ng/mL

## 2017-09-09 LAB — CBC
HCT: 30.1 % — ABNORMAL LOW (ref 39.0–52.0)
Hemoglobin: 9.5 g/dL — ABNORMAL LOW (ref 13.0–17.0)
MCH: 28.6 pg (ref 26.0–34.0)
MCHC: 31.6 g/dL (ref 30.0–36.0)
MCV: 90.7 fL (ref 78.0–100.0)
PLATELETS: 261 10*3/uL (ref 150–400)
RBC: 3.32 MIL/uL — AB (ref 4.22–5.81)
RDW: 13.6 % (ref 11.5–15.5)
WBC: 11.7 10*3/uL — ABNORMAL HIGH (ref 4.0–10.5)

## 2017-09-09 LAB — GLUCOSE, CAPILLARY
Glucose-Capillary: 102 mg/dL — ABNORMAL HIGH (ref 65–99)
Glucose-Capillary: 115 mg/dL — ABNORMAL HIGH (ref 65–99)
Glucose-Capillary: 120 mg/dL — ABNORMAL HIGH (ref 65–99)
Glucose-Capillary: 128 mg/dL — ABNORMAL HIGH (ref 65–99)
Glucose-Capillary: 134 mg/dL — ABNORMAL HIGH (ref 65–99)
Glucose-Capillary: 165 mg/dL — ABNORMAL HIGH (ref 65–99)

## 2017-09-09 LAB — TRIGLYCERIDES: TRIGLYCERIDES: 112 mg/dL (ref <150)

## 2017-09-09 LAB — POTASSIUM: POTASSIUM: 3.6 mmol/L (ref 3.5–5.1)

## 2017-09-09 MED ORDER — RACEPINEPHRINE HCL 2.25 % IN NEBU
0.5000 mL | INHALATION_SOLUTION | RESPIRATORY_TRACT | Status: AC
  Administered 2017-09-09 (×2): 0.5 mL via RESPIRATORY_TRACT
  Filled 2017-09-09 (×3): qty 0.5

## 2017-09-09 NOTE — Progress Notes (Signed)
Patient ID: Vincent Davis, male   DOB: 11/03/1975, 41 y.o.   MRN: [id number] Extubated today Awake, alert and oriented x3. Motor all extremities intact and normal. Hemovac suction canister.  Dr. Ophelia CharterYates to remove drain.

## 2017-09-09 NOTE — Progress Notes (Signed)
PULMONARY / CRITICAL CARE MEDICINE   Name: Vincent Davis MRN: [id number] DOB: 12/29/1975    ADMISSION DATE:  09/03/2017 CONSULTATION DATE:  09/03/2017  REFERRING MD:  Dr. Carolyne Davis Ortho  CHIEF COMPLAINT:  PEA arrest  HISTORY OF PRESENT ILLNESS:   41 year old male with history of cervical spondylosis with radiculopathy. He presented 12/3 for elective repair under Dr. Ophelia Davis. The surgery was without complication. In the postoperative setting he was doing well and recovering as expected with the exception of some respiratory secretions. Was conversant with nurse. She left him to get pain medication and upon returning a few minutes later she found him unresponsive and pulseless. PEA. He was coded off and on for 20 mins, but there was eventual return of spontaneous circulation that endured. He was transferred to ICU.  EKG showed no ischemic changes and there was no significant bump in his troponin.  CT scan of the neck shows  soft tissue swelling in the prevertebral space above the surgical repair and it appears that the PEA arrest was respiratory in origin secondary to airway compromise.  This morning he is alert and interactive, he remains intubated and mechanically ventilated.    SUBJECTIVE:  Comfortable on pressure support ventilation no acute distress  VITAL SIGNS: BP 124/72   Pulse 78   Temp 99.6 F (37.6 C) (Axillary)   Resp 17   Ht 5\' 11"  (1.803 m)   Wt 255 lb 1.2 oz (115.7 kg)   SpO2 100%   BMI 35.58 kg/m   HEMODYNAMICS:    VENTILATOR SETTINGS: Vent Mode: CPAP;PSV FiO2 (%):  [30 %] 30 % Set Rate:  [22 bmp] 22 bmp Vt Set:  [620 mL] 620 mL PEEP:  [5 cmH20] 5 cmH20 Pressure Support:  [5 cmH20] 5 cmH20 Plateau Pressure:  [21 cmH20-24 cmH20] 24 cmH20  INTAKE / OUTPUT:  Intake/Output Summary (Last 24 hours) at 09/09/2017 1416 Last data filed at 09/09/2017 0800 Gross per 24 hour  Intake 3446.3 ml  Output 2560 ml  Net 886.3 ml     PHYSICAL EXAMINATION: General: He is  in no distress following extubation.  His voice is soft and somewhat hoarse but he is entirely appropriate.  HEENT: Bilateral scleral hemorrhage noted.  He does not have stridor post extubation..  He has no drooling.   Soft collar in place.  Has drain placed to anterior cervical disc dressing.  This was reexplored and evacuated on 12/7. Pulmonary: He is unlabored post extubation with symmetric air movement and no wheezes.   Cardiac: Regular rate and rhythm without murmur rub or gallop Abdomen: Soft nontender no organomegaly positive bowel sounds tolerating tube feeds Extremities/musculoskeletal: Equal strength the upper extremities.   LABS:  BMET Recent Labs  Lab 09/07/17 0506 09/08/17 0329 09/09/17 0418  NA 138 137 136  K 3.5 3.4* 4.8  CL 108 106 106  CO2 21* 20* 20*  BUN 14 14 16   CREATININE 1.01 0.94 0.87  GLUCOSE 116* 118* 133*    Electrolytes Recent Labs  Lab 09/05/17 1827 09/06/17 0448 09/07/17 0506 09/07/17 1432 09/08/17 0329 09/08/17 0841 09/09/17 0418  CALCIUM  --  8.2* 8.5*  --  8.5*  --  8.1*  MG 1.8 1.8  --   --   --  1.9  --   PHOS 2.6 2.4*  --  2.8 2.2*  --   --     CBC Recent Labs  Lab 09/06/17 0448 09/08/17 0329 09/09/17 0702  WBC 14.0* 14.2* 11.7*  HGB 9.7* 9.9* 9.5*  HCT 30.9* 31.2* 30.1*  PLT 184 241 261    Coag's Recent Labs  Lab 09/03/17 2226  INR 1.01    Sepsis Markers Recent Labs  Lab 09/03/17 2226 09/08/17 0841 09/09/17 0702  LATICACIDVEN 9.6*  --   --   PROCALCITON  --  0.16 0.10    ABG Recent Labs  Lab 09/04/17 1125 09/05/17 0930 09/06/17 0318  PHART 7.273* 7.306* 7.485*  PCO2ART 60.8* 53.1* 30.9*  PO2ART 106 90.0 129*    Liver Enzymes Recent Labs  Lab 09/03/17 2115  AST 65*  ALT 33  ALKPHOS 44  BILITOT 0.7  ALBUMIN 3.6    Cardiac Enzymes Recent Labs  Lab 09/04/17 1538 09/04/17 2243 09/08/17 0329  TROPONINI 0.07* 0.06* <0.03    Glucose Recent Labs  Lab 09/08/17 1546 09/08/17 2007  09/08/17 2336 09/09/17 0400 09/09/17 0803 09/09/17 1146  GLUCAP 133* 128* 148* 134* 128* 165*    Imaging Dg Chest Port 1 View  Result Date: 09/09/2017 CLINICAL DATA:  41 year old male with acute respiratory failure EXAM: PORTABLE CHEST 1 VIEW COMPARISON:  09/08/2017 FINDINGS: Endotracheal tube terminates approximately 6 cm above the level of the carina. An enteric tube courses below the diaphragm in courses overlying the stomach. Cardiomediastinal silhouette is unchanged. Mild diffuse airspace disease is again identified. A slightly improving on the right and now appears symmetric. No focal consolidations, pleural effusions or pneumothorax. No acute osseous abnormality. IMPRESSION: Mild diffuse airspace disease, slightly improved on the right compared to prior exam and now appears symmetric. Electronically Signed   By: Vincent Brothers M.D.   On: 09/09/2017 07:19     STUDIES:  CT Soft Tissue Neck: 12/6>> Large retropharyngeal hematoma with progressive deep and superficial neck edema. Hemorrhage tracks along the upper thoracic esophagus. The hematoma has become more organized when compared to 2 days prior. C6-7 ACDF hardware in good position. Bilateral atelectasis.   CT soft tissues neck 12/4 >>> hematoma at surgical site.  Chest x-ray confirms  a well-placed endotracheal tube and some haziness at the  right base.  The NG tube projects well below the diaphragm.   CULTURES: Sputum cx 12/3 > abundant white blood cells no organisms  ANTIBIOTICS: Unasyn 12/3 >  SIGNIFICANT EVENTS: 12/3 admit, surgery, resp>cardiac arrest 20 mins. Intubated, to ICU 12/7 re-exploration of neck, I&D of old hematoma. Drain left in place.   LINES/TUBES: ETT 12/3 >  DISCUSSION: 41 year old male presented for elective C-spine surgery. In post of setting developed respiratory distress subsequently cardiac arrest. 20 mins PEA.  Arrest appears to be on the basis of airway compromise.  He had a great deal of  soft tissue swelling in hematoma on a CT scan of the neck.  Expectant management resulted in essentially no resolution of the soft tissue swelling and he underwent exploration with drainage of the hematoma on 12/ 7.  He was able to breathe around the endotracheal tube with the cuff deflated today and he has been extubated.  Extubation so far has been well tolerated     ASSESSMENT / PLAN:   Vent dependent respiratory failure secondary to PEA arrest with suspected respiratory etiology as a result of airway compromise +/-narcotics He has been extubated this afternoon and extubation is well-tolerated.  Although he does not have stridor I am going to give him 3 doses of racemic epinephrine and attempt to minimize any mucosal edema.  I am keeping a bougie at the bedside in the event that  he needs to be reintubated  Low-grade fever and leukocytosis. -White blood cell holding around 14.  No significant temp spikes since 12/7 Plan The leukocytosis and fever may very well be to the hematoma alone.  He never had an impressive infiltrate on chest x-ray.  He will complete a course of Unasyn for potential aspiration tomorrow.    Greater than 32 minutes was spent in the care of this patient today  Penny PiaWJ Chantele Corado, MD Simonne MartinetPeter E Babcock ACNP-BC [facility]Critical [facility] Pager # [phone] OR # [phone] if no answer

## 2017-09-09 NOTE — Procedures (Signed)
Extubation Procedure Note  Patient Details:   Name: Vincent Davis DOB: 09/10/1976 MRN: [id number]   Airway Documentation:     Evaluation  O2 sats: stable throughout Complications: No apparent complications Patient did tolerate procedure well. Bilateral Breath Sounds: Rhonchi   Yes   Patient extubated to 2lnc. Vital signs stable at this time. No complications. Patient tolerating well. RN and family at bedside. RT will continue to monitor.  Ave Filterdkins, Alga Southall Williams 09/09/2017, 2:18 PM

## 2017-09-09 NOTE — Progress Notes (Signed)
Right arm edematous +2/3, pt with +2 pulses radial, pt c/o of arm being tight, MD/Gray updated.

## 2017-09-10 ENCOUNTER — Inpatient Hospital Stay (HOSPITAL_COMMUNITY): Payer: Self-pay

## 2017-09-10 ENCOUNTER — Encounter (HOSPITAL_COMMUNITY): Payer: Self-pay | Admitting: Orthopaedic Surgery

## 2017-09-10 LAB — CBC
HEMATOCRIT: 36.1 % — AB (ref 39.0–52.0)
HEMOGLOBIN: 11.5 g/dL — AB (ref 13.0–17.0)
MCH: 29 pg (ref 26.0–34.0)
MCHC: 31.9 g/dL (ref 30.0–36.0)
MCV: 90.9 fL (ref 78.0–100.0)
Platelets: 355 10*3/uL (ref 150–400)
RBC: 3.97 MIL/uL — AB (ref 4.22–5.81)
RDW: 13.5 % (ref 11.5–15.5)
WBC: 18.7 10*3/uL — AB (ref 4.0–10.5)

## 2017-09-10 LAB — BASIC METABOLIC PANEL
ANION GAP: 12 (ref 5–15)
BUN: 14 mg/dL (ref 6–20)
CALCIUM: 9.3 mg/dL (ref 8.9–10.3)
CO2: 25 mmol/L (ref 22–32)
CREATININE: 0.98 mg/dL (ref 0.61–1.24)
Chloride: 105 mmol/L (ref 101–111)
GLUCOSE: 144 mg/dL — AB (ref 65–99)
Potassium: 3.6 mmol/L (ref 3.5–5.1)
Sodium: 142 mmol/L (ref 135–145)

## 2017-09-10 LAB — PROCALCITONIN: Procalcitonin: <0.1 ng/mL

## 2017-09-10 LAB — GLUCOSE, CAPILLARY
GLUCOSE-CAPILLARY: 130 mg/dL — AB (ref 65–99)
GLUCOSE-CAPILLARY: 104 mg/dL — AB (ref 65–99)
Glucose-Capillary: 130 mg/dL — ABNORMAL HIGH (ref 65–99)
Glucose-Capillary: 147 mg/dL — ABNORMAL HIGH (ref 65–99)
Glucose-Capillary: 117 mg/dL — ABNORMAL HIGH (ref 65–99)

## 2017-09-10 MED ORDER — HYDRALAZINE HCL 20 MG/ML IJ SOLN
10.0000 mg | INTRAMUSCULAR | Status: DC | PRN
  Administered 2017-09-10 – 2017-09-14 (×6): 10 mg via INTRAVENOUS
  Filled 2017-09-10 (×7): qty 1

## 2017-09-10 MED ORDER — FUROSEMIDE 10 MG/ML IJ SOLN
10.0000 mg | Freq: Three times a day (TID) | INTRAMUSCULAR | Status: DC
  Administered 2017-09-10 – 2017-09-11 (×3): 10 mg via INTRAVENOUS
  Filled 2017-09-10 (×3): qty 2

## 2017-09-10 NOTE — Progress Notes (Signed)
PULMONARY / CRITICAL CARE MEDICINE   Name: Vincent DyerSamuel Keith Davis MRN: [id number] DOB: 12/27/1975    ADMISSION DATE:  09/03/2017 CONSULTATION DATE:  09/03/2017  REFERRING MD:  Dr. [facility]  CHIEF COMPLAINT:  PEA arrest  HISTORY OF PRESENT ILLNESS:   41 year old male with history of cervical spondylosis with radiculopathy. He presented 12/3 for elective repair under Dr. Ophelia CharterYates. The surgery was without complication. In the postoperative setting he was doing well and recovering as expected with the exception of some respiratory secretions. Was conversant with nurse. She left him to get pain medication and upon returning a few minutes later she found him unresponsive and pulseless. PEA. He was coded off and on for 20 mins, but there was eventual return of spontaneous circulation that endured. He was transferred to ICU.  EKG showed no ischemic changes and there was no significant bump in his troponin.  CT scan of the neck showed  soft tissue swelling in the prevertebral space above the surgical repair and it appears that the PEA arrest was respiratory in origin secondary to airway compromise.  He was extubated on 12/9    SUBJECTIVE:  Today he has no complaints of shortness of breath but he is having difficulty clearing his upper airway secretions.  He is frequently using the Jankur suction.    VITAL SIGNS: BP (!) 175/69 (BP Location: Left Leg)   Pulse (!) 104   Temp 98.9 F (37.2 C) (Axillary)   Resp (!) 22   Ht 5\' 11"  (1.803 m)   Wt 255 lb 11.7 oz (116 kg)   SpO2 98%   BMI 35.67 kg/m   HEMODYNAMICS:    VENTILATOR SETTINGS: Vent Mode: CPAP;PSV FiO2 (%):  [30 %] 30 % PEEP:  [5 cmH20] 5 cmH20 Pressure Support:  [5 cmH20] 5 cmH20  INTAKE / OUTPUT:  Intake/Output Summary (Last 24 hours) at 09/10/2017 1123 Last data filed at 09/10/2017 1045 Gross per 24 hour  Intake 2439.63 ml  Output 3010 ml  Net -570.37 ml     PHYSICAL EXAMINATION: General: He is in no distress following  extubation.  His voice is raspy but his speech content appropriate.     HEENT: Bilateral scleral hemorrhage noted.  He does not have stridor.  He has no drooling.   Soft collar in place.  Has drain placed to anterior cervical disc dressing.  This was reexplored and evacuated on 12/7. Pulmonary: He is unlabored post extubation with symmetric air movement and no wheezes.   Cardiac: Regular rate and rhythm without murmur rub or gallop Abdomen: Soft nontender no organomegaly positive bowel sounds tolerating tube feeds Extremities/musculoskeletal: Equal strength the upper extremities.   LABS:  BMET Recent Labs  Lab 09/08/17 0329 09/09/17 0418 09/09/17 0702 09/10/17 0308  NA 137 136  --  142  K 3.4* 4.8 3.6 3.6  CL 106 106  --  105  CO2 20* 20*  --  25  BUN 14 16  --  14  CREATININE 0.94 0.87  --  0.98  GLUCOSE 118* 133*  --  144*    Electrolytes Recent Labs  Lab 09/05/17 1827 09/06/17 0448  09/07/17 1432 09/08/17 0329 09/08/17 0841 09/09/17 0418 09/10/17 0308  CALCIUM  --  8.2*   < >  --  8.5*  --  8.1* 9.3  MG 1.8 1.8  --   --   --  1.9  --   --   PHOS 2.6 2.4*  --  2.8 2.2*  --   --   --    < > =  values in this interval not displayed.    CBC Recent Labs  Lab 09/08/17 0329 09/09/17 0702 09/10/17 0308  WBC 14.2* 11.7* 18.7*  HGB 9.9* 9.5* 11.5*  HCT 31.2* 30.1* 36.1*  PLT 241 261 355    Coag's Recent Labs  Lab 09/03/17 2226  INR 1.01    Sepsis Markers Recent Labs  Lab 09/03/17 2226 09/08/17 0841 09/09/17 0702 09/10/17 0308  LATICACIDVEN 9.6*  --   --   --   PROCALCITON  --  0.16 0.10 <0.10    ABG Recent Labs  Lab 09/04/17 1125 09/05/17 0930 09/06/17 0318  PHART 7.273* 7.306* 7.485*  PCO2ART 60.8* 53.1* 30.9*  PO2ART 106 90.0 129*    Liver Enzymes Recent Labs  Lab 09/03/17 2115  AST 65*  ALT 33  ALKPHOS 44  BILITOT 0.7  ALBUMIN 3.6    Cardiac Enzymes Recent Labs  Lab 09/04/17 1538 09/04/17 2243 09/08/17 0329  TROPONINI  0.07* 0.06* <0.03    Glucose Recent Labs  Lab 09/09/17 1146 09/09/17 1525 09/09/17 1938 09/09/17 2335 09/10/17 0421 09/10/17 0733  GLUCAP 165* 120* 102* 115* 130* 130*    Imaging No results found.   STUDIES:  CT Soft Tissue Neck: 12/6>> Large retropharyngeal hematoma with progressive deep and superficial neck edema. Hemorrhage tracks along the upper thoracic esophagus. The hematoma has become more organized when compared to 2 days prior. C6-7 ACDF hardware in good position. Bilateral atelectasis.   CT soft tissues neck 12/4 >>> hematoma at surgical site.  Chest x-ray confirms  a well-placed endotracheal tube and some haziness at the  right base.  The NG tube projects well below the diaphragm.   CULTURES: Sputum cx 12/3 > abundant white blood cells no organisms  ANTIBIOTICS: Unasyn 12/3 >  SIGNIFICANT EVENTS: 12/3 admit, surgery, resp>cardiac arrest 20 mins. Intubated, to ICU 12/7 re-exploration of neck, I&D of old hematoma. Drain left in place.   LINES/TUBES: ETT 12/3 >  DISCUSSION: 41 year old male presented for elective C-spine surgery. In post op setting developed respiratory distress subsequently cardiac arrest. 20 mins PEA.  Arrest appears to be on the basis of airway compromise.  He had a great deal of soft tissue swelling and hematoma on a CT scan of the neck.  Expectant management resulted in essentially no resolution of the soft tissue swelling and he underwent exploration with drainage of the hematoma on 12/ 7.  He was able to breathe around the endotracheal tube with the cuff deflated 12/9 and he has been extubated.  Extubation so far has been well tolerated but he is having difficulties clearing his upper airway secretions.    ASSESSMENT / PLAN:   Vent dependent respiratory failure secondary to PEA arrest with suspected respiratory etiology as a result of airway compromise +/-narcotics He has been extubated.  Although he does not have stridor, the  difficulty that he is having and clearing his secretions.  I am diuresing the 8 L of fluid he is accumulated since admission hoping to decrease pharyngeal and upper airway edema.   Low-grade fever and leukocytosis. -White blood cells at 18k.  No significant temp spikes since 12/7 Plan The leukocytosis and fever may very well be due to the hematoma alone.  However I am very concerned by his difficulty in clearing his upper airway secretions today.  He completes a course of Unasyn today, we will be repeating a chest x-ray in the morning and will have a very low threshold for reinitiating antibiotics.  Penny PiaWJ Reinette Cuneo, MD  [facility]Critical [facility] Pager # [phone] OR # [phone] if no answer

## 2017-09-10 NOTE — Progress Notes (Signed)
SLP Cancellation Note  Patient Details Name: Vincent Davis MRN: [id number] DOB: 09/15/1976   Cancelled treatment:       Reason Eval/Treat Not Completed: Patient not medically ready. Pt on schedule with radiology for DG swallow function (MBS), but no orders for SLP to assess. In discussion with RN, pt is not appropriate for MBS today given poor secretion management. Await SLP eval and treat for swallowing to determine plan for appropriate treatment and assessment. Discussed with RN.    Shakaya Bhullar, Riley NearingBonnie Caroline 09/10/2017, 11:43 AM

## 2017-09-10 NOTE — Progress Notes (Signed)
On assessment, the hemovac in pt's neck was out. I assessed his neck which looks clean and intact. Notified Ortho and they said they were going to pull hemovac out tomorrow anyway and no further actions are needed at this time. Will continue to monitor

## 2017-09-11 ENCOUNTER — Inpatient Hospital Stay (HOSPITAL_COMMUNITY): Payer: Self-pay

## 2017-09-11 LAB — MAGNESIUM: MAGNESIUM: 2.1 mg/dL (ref 1.7–2.4)

## 2017-09-11 LAB — BASIC METABOLIC PANEL
ANION GAP: 11 (ref 5–15)
BUN: 14 mg/dL (ref 6–20)
CHLORIDE: 110 mmol/L (ref 101–111)
CO2: 24 mmol/L (ref 22–32)
Calcium: 9.1 mg/dL (ref 8.9–10.3)
Creatinine, Ser: 1 mg/dL (ref 0.61–1.24)
Glucose, Bld: 127 mg/dL — ABNORMAL HIGH (ref 65–99)
POTASSIUM: 3.4 mmol/L — AB (ref 3.5–5.1)
SODIUM: 145 mmol/L (ref 135–145)

## 2017-09-11 LAB — CBC WITH DIFFERENTIAL/PLATELET
BASOS ABS: 0 10*3/uL (ref 0.0–0.1)
Basophils Relative: 0 %
EOS ABS: 0 10*3/uL (ref 0.0–0.7)
Eosinophils Relative: 0 %
HCT: 37 % — ABNORMAL LOW (ref 39.0–52.0)
HEMOGLOBIN: 11.8 g/dL — AB (ref 13.0–17.0)
LYMPHS PCT: 13 %
Lymphs Abs: 2.2 10*3/uL (ref 0.7–4.0)
MCH: 29.1 pg (ref 26.0–34.0)
MCHC: 31.9 g/dL (ref 30.0–36.0)
MCV: 91.1 fL (ref 78.0–100.0)
Monocytes Absolute: 2 10*3/uL — ABNORMAL HIGH (ref 0.1–1.0)
Monocytes Relative: 12 %
NEUTROS ABS: 12.4 10*3/uL — AB (ref 1.7–7.7)
Neutrophils Relative %: 75 %
Platelets: 417 10*3/uL — ABNORMAL HIGH (ref 150–400)
RBC: 4.06 MIL/uL — AB (ref 4.22–5.81)
RDW: 14.2 % (ref 11.5–15.5)
WBC: 16.6 10*3/uL — AB (ref 4.0–10.5)

## 2017-09-11 LAB — C DIFFICILE QUICK SCREEN W PCR REFLEX
C DIFFICILE (CDIFF) INTERP: NOT DETECTED
C Diff antigen: NEGATIVE
C Diff toxin: NEGATIVE

## 2017-09-11 LAB — GLUCOSE, CAPILLARY
GLUCOSE-CAPILLARY: 128 mg/dL — AB (ref 65–99)
GLUCOSE-CAPILLARY: 123 mg/dL — AB (ref 65–99)
GLUCOSE-CAPILLARY: 120 mg/dL — AB (ref 65–99)
GLUCOSE-CAPILLARY: 128 mg/dL — AB (ref 65–99)
GLUCOSE-CAPILLARY: 118 mg/dL — AB (ref 65–99)
Glucose-Capillary: 117 mg/dL — ABNORMAL HIGH (ref 65–99)

## 2017-09-11 MED ORDER — PRO-STAT SUGAR FREE PO LIQD
30.0000 mL | Freq: Two times a day (BID) | ORAL | Status: DC
  Administered 2017-09-11 – 2017-09-13 (×5): 30 mL
  Administered 2017-09-14: 09:00:00
  Filled 2017-09-11 (×6): qty 30

## 2017-09-11 MED ORDER — FUROSEMIDE 10 MG/ML IJ SOLN
20.0000 mg | Freq: Three times a day (TID) | INTRAMUSCULAR | Status: DC
  Administered 2017-09-11 – 2017-09-14 (×10): 20 mg via INTRAVENOUS
  Filled 2017-09-11 (×10): qty 2

## 2017-09-11 MED ORDER — SODIUM CHLORIDE 0.9% FLUSH: 10.0000 mL | INTRAVENOUS | Status: DC | PRN

## 2017-09-11 MED ORDER — POTASSIUM CHLORIDE 20 MEQ PO PACK
20.0000 meq | PACK | Freq: Three times a day (TID) | ORAL | Status: DC
  Administered 2017-09-11 – 2017-09-13 (×6): 20 meq via ORAL
  Filled 2017-09-11 (×6): qty 1

## 2017-09-11 MED ORDER — QUETIAPINE FUMARATE 25 MG PO TABS
25.0000 mg | ORAL_TABLET | Freq: Every day | ORAL | Status: DC
  Administered 2017-09-11: 25 mg via ORAL
  Filled 2017-09-11: qty 1

## 2017-09-11 MED ORDER — JEVITY 1.2 CAL PO LIQD
1000.0000 mL | ORAL | Status: DC
  Administered 2017-09-11: 60 mL/h
  Administered 2017-09-12 – 2017-09-14 (×2): 1000 mL
  Filled 2017-09-11 (×7): qty 1000

## 2017-09-11 MED ORDER — CHLORHEXIDINE GLUCONATE CLOTH 2 % EX PADS
6.0000 | MEDICATED_PAD | Freq: Every day | CUTANEOUS | Status: DC
  Administered 2017-09-11 – 2017-09-13 (×3): 6 via TOPICAL

## 2017-09-11 NOTE — Progress Notes (Signed)
PT Cancellation Note  Patient Details Name: Vincent DyerSamuel Keith Hise MRN: [id number] DOB: 10/12/1975   Cancelled Treatment:    Reason Eval/Treat Not Completed: Other (comment). Per Dr. Ophelia CharterYates note, patient to start with PT OOB to chair tomorrow 12/12. PT to return as able tomorrow 09/12/17.   Ronaldo Crilly M Jamiya Nims 09/11/2017, 12:15 PM   Lewis ShockAshly Nox Talent, PT, DPT Pager #: [phone] Office #: [phone]

## 2017-09-11 NOTE — Progress Notes (Signed)
PULMONARY / CRITICAL CARE MEDICINE   Name: Vincent Davis MRN: [id number] DOB: January 09, 1976    ADMISSION DATE:  09/03/2017 CONSULTATION DATE:  09/03/2017  REFERRING MD:  Dr. Carolyne Littles  CHIEF COMPLAINT:  PEA arrest  HISTORY OF PRESENT ILLNESS:   41 year old male with history of cervical spondylosis with radiculopathy. He presented 12/3 for elective repair under Dr. Ophelia Charter. The surgery was without complication. In the postoperative setting he was doing well and recovering as expected with the exception of some respiratory secretions. Was conversant with nurse. She left him to get pain medication and upon returning a few minutes later she found him unresponsive and pulseless. PEA. He was coded off and on for 20 mins, but there was eventual return of spontaneous circulation that endured. He was transferred to ICU.  EKG showed no ischemic changes and there was no significant bump in his troponin.  CT scan of the neck showed  soft tissue swelling in the prevertebral space above the surgical repair and it appears that the PEA arrest was respiratory in origin secondary to airway compromise.  He was extubated on 12/9.    SUBJECTIVE:  Today voice quality is much improved and he is able to speak in complete sentences.  He is having some productive cough.  Continues to need the Yankauer catheter to suction his oral secretions.     VITAL SIGNS: BP (!) 170/79   Pulse 78   Temp 99.1 F (37.3 C) (Oral)   Resp (!) 22   Ht 5\' 11"  (1.803 m)   Wt 257 lb 8 oz (116.8 kg)   SpO2 98%   BMI 35.91 kg/m   HEMODYNAMICS:    VENTILATOR SETTINGS:    INTAKE / OUTPUT:  Intake/Output Summary (Last 24 hours) at 09/11/2017 1015 Last data filed at 09/11/2017 0800 Gross per 24 hour  Intake 2288 ml  Output 2065 ml  Net 223 ml     PHYSICAL EXAMINATION: General: He is in no respiratory distress.  His voice quality is much improved today.     HEENT: Bilateral scleral hemorrhage noted.  He does not have  stridor.  He has no drooling.   Pulmonary: He is unlabored post extubation with symmetric air movement and no wheezes.   Cardiac: Regular rate and rhythm without murmur rub or gallop Abdomen: Soft nontender no organomegaly positive bowel sounds tolerating tube feeds Extremities/musculoskeletal: Grip is 5/5 on the right and 4 5 on the left.     LABS:  BMET Recent Labs  Lab 09/09/17 0418 09/09/17 0702 09/10/17 0308 09/11/17 0332  NA 136  --  142 145  K 4.8 3.6 3.6 3.4*  CL 106  --  105 110  CO2 20*  --  25 24  BUN 16  --  14 14  CREATININE 0.87  --  0.98 1.00  GLUCOSE 133*  --  144* 127*    Electrolytes Recent Labs  Lab 09/06/17 0448  09/07/17 1432 09/08/17 0329 09/08/17 0841 09/09/17 0418 09/10/17 0308 09/11/17 0332  CALCIUM 8.2*   < >  --  8.5*  --  8.1* 9.3 9.1  MG 1.8  --   --   --  1.9  --   --  2.1  PHOS 2.4*  --  2.8 2.2*  --   --   --   --    < > = values in this interval not displayed.    CBC Recent Labs  Lab 09/09/17 1191 09/10/17 0308 09/11/17 4782  WBC 11.7* 18.7* 16.6*  HGB 9.5* 11.5* 11.8*  HCT 30.1* 36.1* 37.0*  PLT 261 355 417*    Coag's No results for input(s): APTT, INR in the last 168 hours.  Sepsis Markers Recent Labs  Lab 09/08/17 0841 09/09/17 0702 09/10/17 0308  PROCALCITON 0.16 0.10 <0.10    ABG Recent Labs  Lab 09/04/17 1125 09/05/17 0930 09/06/17 0318  PHART 7.273* 7.306* 7.485*  PCO2ART 60.8* 53.1* 30.9*  PO2ART 106 90.0 129*    Liver Enzymes No results for input(s): AST, ALT, ALKPHOS, BILITOT, ALBUMIN in the last 168 hours.  Cardiac Enzymes Recent Labs  Lab 09/04/17 1538 09/04/17 2243 09/08/17 0329  TROPONINI 0.07* 0.06* <0.03    Glucose Recent Labs  Lab 09/10/17 0733 09/10/17 1216 09/10/17 1658 09/10/17 1941 09/11/17 0356 09/11/17 0823  GLUCAP 130* 147* 117* 104* 128* 123*    Imaging Dg Chest Port 1 View  Result Date: 09/11/2017 CLINICAL DATA:  Aspiration EXAM: PORTABLE CHEST 1 VIEW  COMPARISON:  September 09, 2017 FINDINGS: Endotracheal tube is no longer appreciable. Feeding tube tip is below the diaphragm. No pneumothorax. There is currently rather subtle patchy atelectasis in the right upper lobe. Similar changes are noted in the left base. Lungs elsewhere clear. Heart is mildly enlarged with pulmonary vascularity within normal limits. No adenopathy. No bone lesions. There is postoperative change in the lower cervical spine. IMPRESSION: Rather subtle patchy infiltrate right upper lobe and left base, stable from 2 days prior. Lungs elsewhere clear. No new opacity evident. Stable cardiac prominence. No pneumothorax. Electronically Signed   By: Bretta BangWilliam  Woodruff III M.D.   On: 09/11/2017 07:20     STUDIES:  CT Soft Tissue Neck: 12/6>> Large retropharyngeal hematoma with progressive deep and superficial neck edema. Hemorrhage tracks along the upper thoracic esophagus. The hematoma has become more organized when compared to 2 days prior. C6-7 ACDF hardware in good position. Bilateral atelectasis.   CT soft tissues neck 12/4 >>> hematoma at surgical site.  Chest x-ray confirms  a well-placed endotracheal tube and some haziness at the  right base.  The NG tube projects well below the diaphragm.   CULTURES: Sputum cx 12/3 > abundant white blood cells no organisms  ANTIBIOTICS: Unasyn 12/3 >  SIGNIFICANT EVENTS: 12/3 admit, surgery, resp>cardiac arrest 20 mins. Intubated, to ICU 12/7 re-exploration of neck, I&D of old hematoma. Drain left in place.   LINES/TUBES: ETT 12/3 >  DISCUSSION: 41 year old male presented for elective C-spine surgery. In post op setting developed respiratory distress subsequently cardiac arrest. 20 mins PEA.  Arrest appears to be on the basis of airway compromise.  He had a great deal of soft tissue swelling and hematoma on a CT scan of the neck.  Expectant management resulted in essentially no resolution of the soft tissue swelling and he  underwent exploration with drainage of the hematoma on 12/ 7.  He was able to breathe around the endotracheal tube with the cuff deflated 12/9 and he has been extubated.  Extubation so far has been well tolerated but he is having difficulties clearing his upper airway secretions.    ASSESSMENT / PLAN:   Vent dependent respiratory failure secondary to PEA arrest with suspected respiratory etiology as a result of airway compromise +/-narcotics He has been extubated extubation has been well-tolerated.  I am somewhat concerned that he has a persistent leukocytosis and I am going to be continuing his empiric Unasyn for aspiration.  Low-grade fever and leukocytosis. -White blood cells at  18k.  No significant temp spikes since 12/7 Plan The leukocytosis and fever may very well be due to the hematoma alone.  However I am very concerned by his difficulty in clearing his upper airway secretions today.  He completes a course of Unasyn today, we will be repeating a chest x-ray in the morning and will have a very low threshold for reinitiating antibiotics.     Hypertension.  He is still 8 L positive on this admission and I am hoping that his blood pressure will prove as we diurese the patient.   Penny PiaWJ Tacoma Merida, MD  [facility] Pulmonary/Critical Care Pager # [phone] OR # [phone] if no answer

## 2017-09-11 NOTE — Progress Notes (Signed)
Nutrition Follow-up  DOCUMENTATION CODES:   Obesity unspecified  INTERVENTION:   D/C Vital High Protien  Jevity 1.2 @ 60 ml/hr (1440 ml/day) 30 ml Prostat BID Provides: 1928 kcal, 110 grams protein, and 1167 ml free water.    NUTRITION DIAGNOSIS:    Inadequate oral intake related to inability to eat as evidenced by NPO status. Ongoing.   GOAL:   Patient will meet greater than or equal to 90% of their needs Progressing.   MONITOR:   TF tolerance, I & O's  ASSESSMENT:   Pt with PMH of cervical spondylosis with radiculopathy admitted 12/3 for elective repair. Post op pt developed respiratory distress with cardiac arrest.   Pt discussed during ICU rounds and with RN.   12/7 Cortrak placed 12/9 extubated Failed swallow today   Medications reviewed and include: colace, lasix, miralax, KCl, senokot, thiamine Labs reviewed: KCl 3.4 (L) CBG's: [phone]   Diet Order:  Diet NPO time specified  EDUCATION NEEDS:   No education needs have been identified at this time  Skin:  Skin Assessment: Skin Integrity Issues: Skin Integrity Issues:: Incisions Incisions: neck  Last BM:  12/11 type 7  Height:   Ht Readings from Last 1 Encounters:  09/03/17 5\' 11"  (1.803 m)    Weight:   Wt Readings from Last 1 Encounters:  09/11/17 257 lb 8 oz (116.8 kg)    Ideal Body Weight:  78.1 kg  BMI:  Body mass index is 35.91 kg/m.  Estimated Nutritional Needs:   Kcal:  1900-2100  Protein:  95-115 grams  Fluid:  > 1.9 L/day  Kendell BaneHeather Sema Stangler RD, LDN, CNSC [phone] Pager [phone] After Hours Pager

## 2017-09-11 NOTE — Care Management Note (Signed)
Case Management Note  Patient Details  Name: Vincent Davis MRN: [id number] Date of Birth: 06/02/1976  Subjective/Objective:  Pt admitted on 09/03/17 s/p C6-7 cervical fusion with post operative PEA/respiratory arrest due to airway compromise.  PTA, pt resided at home with significant other.                   Action/Plan: PT/OT consults pending.  Will follow for discharge planning as pt progresses.    Expected Discharge Date:  (Pending)               Expected Discharge Plan:     In-House Referral:     Discharge planning Services  CM Consult  Post Acute Care Choice:    Choice offered to:     DME Arranged:    DME Agency:     HH Arranged:    HH Agency:     Status of Service:  In process, will continue to follow  If discussed at Long Length of Stay Meetings, dates discussed:    Additional Comments:  Quintella BatonJulie W. Halima Fogal, RN, BSN  Trauma/Neuro ICU Case Manager [phone]

## 2017-09-11 NOTE — Progress Notes (Addendum)
IV team unable to get access, this RN no sites seen, ok per Dr. Wallace CullensGray to get PICC. Patient has had 7 IVs this admission.  [id number] - Pharmacy notified that patient has no IV access, they will reschedule meds once new line in place.

## 2017-09-11 NOTE — Progress Notes (Signed)
Enteric precautions removed, c.diff negative, no other GI panel pending

## 2017-09-11 NOTE — Progress Notes (Signed)
Peripherally Inserted Central Catheter/Midline Placement  The IV Nurse has discussed with the patient and/or persons authorized to consent for the patient, the purpose of this procedure and the potential benefits and risks involved with this procedure.  The benefits include less needle sticks, lab draws from the catheter, and the patient may be discharged home with the catheter. Risks include, but not limited to, infection, bleeding, blood clot (thrombus formation), and puncture of an artery; nerve damage and irregular heartbeat and possibility to perform a PICC exchange if needed/ordered by physician.  Alternatives to this procedure were also discussed.  Bard Power PICC patient education guide, fact sheet on infection prevention and patient information card has been provided to patient /or left at bedside.    PICC/Midline Placement Documentation  PICC Single Lumen 09/11/17 PICC Left Basilic 48 cm 0 cm (Active)  Indication for Insertion or Continuance of Line Poor Vasculature-patient has had multiple peripheral attempts or PIVs lasting less than 24 hours 09/11/2017  4:56 PM  Exposed Catheter (cm) 0 cm 09/11/2017  4:56 PM  Site Assessment Clean;Dry;Intact 09/11/2017  4:56 PM  Line Status Flushed;Saline locked;Blood return noted 09/11/2017  4:56 PM  Dressing Type Transparent 09/11/2017  4:56 PM  Dressing Status Clean;Dry;Intact;Antimicrobial disc in place 09/11/2017  4:56 PM  Dressing Change Due 09/18/17 09/11/2017  4:56 PM       Holt Woolbright, Lajean ManesKerry Loraine 09/11/2017, 4:56 PM

## 2017-09-11 NOTE — Progress Notes (Signed)
   Subjective: 4 Days Post-Op Procedure(s) (LRB): EVACUATION OF NECK HEMATOMA (N/A) Patient reports pain as mild.  States he has not been able to sleep for 2 to 3 days. Denies numbness in either arm or legs. Still using suction to mouth for secretions. HV came out but was not draining . eltubated 12/9 and talking . Voice raspy from ET tube .   Objective: Vital signs in last 24 hours: Temp:  [98.3 F (36.8 C)-99.1 F (37.3 C)] 99.1 F (37.3 C) (12/11 0800) Pulse Rate:  [70-108] 79 (12/11 0800) Resp:  [16-34] 29 (12/11 0800) BP: (144-194)/(60-132) 181/68 (12/11 0800) SpO2:  [95 %-100 %] 98 % (12/11 0800) Weight:  [257 lb 8 oz (116.8 kg)] 257 lb 8 oz (116.8 kg) (12/11 0500)  Intake/Output from previous day: 12/10 0701 - 12/11 0700 In: 1968 [I.V.:1153; NG/GT:510; IV Piggyback:305] Out: 2315 [Urine:2300; Drains:15] Intake/Output this shift: Total I/O In: 620 [I.V.:100; NG/GT:520] Out: 0   Recent Labs    09/09/17 0702 09/10/17 0308 09/11/17 0332  HGB 9.5* 11.5* 11.8*   Recent Labs    09/10/17 0308 09/11/17 0332  WBC 18.7* 16.6*  RBC 3.97* 4.06*  HCT 36.1* 37.0*  PLT 355 417*   Recent Labs    09/10/17 0308 09/11/17 0332  NA 142 145  K 3.6 3.4*  CL 105 110  CO2 25 24  BUN 14 14  CREATININE 0.98 1.00  GLUCOSE 144* 127*  CALCIUM 9.3 9.1   No results for input(s): LABPT, INR in the last 72 hours.  Neurologically intact   Dg Chest Port 1 View  Result Date: 09/11/2017 CLINICAL DATA:  Aspiration EXAM: PORTABLE CHEST 1 VIEW COMPARISON:  September 09, 2017 FINDINGS: Endotracheal tube is no longer appreciable. Feeding tube tip is below the diaphragm. No pneumothorax. There is currently rather subtle patchy atelectasis in the right upper lobe. Similar changes are noted in the left base. Lungs elsewhere clear. Heart is mildly enlarged with pulmonary vascularity within normal limits. No adenopathy. No bone lesions. There is postoperative change in the lower cervical spine.  IMPRESSION: Rather subtle patchy infiltrate right upper lobe and left base, stable from 2 days prior. Lungs elsewhere clear. No new opacity evident. Stable cardiac prominence. No pneumothorax. Electronically Signed   By: Bretta BangWilliam  Woodruff III M.D.   On: 09/11/2017 07:20    Assessment/Plan: 4 Days Post-Op Procedure(s) (LRB): EVACUATION OF NECK HEMATOMA (N/A) Plan:  Continue elevation of head and close monitoring due to risk of respiratory failure. Can start PT tomorrow OOB to recliner.  Has feeding tube need to be sure airway protected with PO intake.   Eldred MangesMark C Thomes Burak 09/11/2017, 10:50 AM

## 2017-09-11 NOTE — Evaluation (Signed)
Clinical/Bedside Swallow Evaluation Patient Details  Name: Vincent Davis MRN: [id number] Date of Birth: 03/27/1976  Today's Date: 09/11/2017 Time: SLP Start Time (ACUTE ONLY): 1335 SLP Stop Time (ACUTE ONLY): 1353 SLP Time Calculation (min) (ACUTE ONLY): 18 min  Past Medical History:  Past Medical History:  Diagnosis Date  . Cervical spondylosis with radiculopathy   . Complication of anesthesia    no previous surgery  . Family history of adverse reaction to anesthesia    "I don't know."  . Obesity (BMI 30.0-34.9)   . Pneumonia    as a child   Past Surgical History:  Past Surgical History:  Procedure Laterality Date  . ANTERIOR CERVICAL DECOMP/DISCECTOMY FUSION N/A 09/03/2017   Procedure: CERVICAL SIX-SEVEN  ANTERIOR CERVICAL DISCECTOMY AND FUSION, ALLOGRAFT, PLATE;  Surgeon: Eldred MangesYates, Mark C, MD;  Location: [facility] OR;  Service: Orthopedics;  Laterality: N/A;  . EVACUATION OF CERVICAL HEMATOMA N/A 09/07/2017   Procedure: EVACUATION OF NECK HEMATOMA;  Surgeon: Eldred MangesYates, Mark C, MD;  Location: [facility] OR;  Service: Orthopedics;  Laterality: N/A;  . NO PAST SURGERIES     HPI:  Pt is a 41 year old male who presented for elective ACDF 12/3. In post-op setting he developed respiratory distress with subsequent cardiac arrest, 20 mins PEA. He had a great deal of soft tissue swelling and hematoma on a CT scan of the neck. He underwent exploration with drainage of the hematoma on 12/ 7. He was intubated 12/3-12/9 but has had difficulty managing secretions post-extubation.   Assessment / Plan / Recommendation Clinical Impression  Pt has frequent coughing at baseline, using oral suction to remove his secretions. With attempts at oral care he has increased coughing and expectoration. Pt has several risk factors for aspiration at this time including recent ACDF with significant edema/hematoma as well as prolonged intubation. He will likely need instrumental testing but I would favor waiting until he is  better managing his secretions and/or his vocal quality becomes stronger, clearer. Will continue to follow and assess clinically. SLP Visit Diagnosis: Dysphagia, unspecified (R13.10)    Aspiration Risk  Severe aspiration risk    Diet Recommendation NPO;Alternative means - temporary   Medication Administration: Via alternative means    Other  Recommendations Oral Care Recommendations: Oral care QID Other Recommendations: Have oral suction available   Follow up Recommendations (tba)      Frequency and Duration min 3x week  2 weeks       Prognosis Prognosis for Safe Diet Advancement: Good Barriers to Reach Goals: Severity of deficits      Swallow Study   General HPI: Pt is a 41 year old male who presented for elective ACDF 12/3. In post-op setting he developed respiratory distress with subsequent cardiac arrest, 20 mins PEA. He had a great deal of soft tissue swelling and hematoma on a CT scan of the neck. He underwent exploration with drainage of the hematoma on 12/ 7. He was intubated 12/3-12/9 but has had difficulty managing secretions post-extubation. Type of Study: Bedside Swallow Evaluation Previous Swallow Assessment: none in chart Diet Prior to this Study: NPO;NG Tube Temperature Spikes Noted: No Respiratory Status: Nasal cannula History of Recent Intubation: Yes Length of Intubations (days): 7 days Date extubated: 09/09/17 Behavior/Cognition: Alert;Cooperative Oral Care Completed by SLP: Yes Oral Cavity - Dentition: Adequate natural dentition Vision: Functional for self-feeding Patient Positioning: Upright in bed Baseline Vocal Quality: Hoarse;Low vocal intensity Volitional Cough: Strong    Oral/Motor/Sensory Function     Ice [employer] Ice  chips: Not tested   Thin Liquid Thin Liquid: Not tested    Nectar Thick Nectar Thick Liquid: Not tested   Honey Thick Honey Thick Liquid: Not tested   Puree Puree: Not tested   Solid   GO   Solid: Not tested         Maxcine Hamaiewonsky, Malerie Eakins 09/11/2017,2:09 PM   Maxcine HamLaura Paiewonsky, M.A. CCC-SLP [phone]

## 2017-09-11 NOTE — Progress Notes (Addendum)
Patient with bloody urine with small clots. Reviewed current order with Dr. Wallace CullensGray. New order to remove foley. Patient states he can feel urge to urinate and that foley is uncomfortable. Will remove foley and continue to monitor.   1720 Patient due to void by 1730, bladder scan reveals 256ml, patient attempted to void into urinal with no success. Will continue to monitor via bladder scan.

## 2017-09-11 NOTE — Plan of Care (Signed)
New orders for PT/OT eval and treat

## 2017-09-12 LAB — CBC WITH DIFFERENTIAL/PLATELET
BASOS PCT: 1 %
Basophils Absolute: 0.2 10*3/uL — ABNORMAL HIGH (ref 0.0–0.1)
EOS PCT: 1 %
Eosinophils Absolute: 0.2 10*3/uL (ref 0.0–0.7)
HEMATOCRIT: 38.9 % — AB (ref 39.0–52.0)
HEMOGLOBIN: 12.4 g/dL — AB (ref 13.0–17.0)
Lymphocytes Relative: 14 %
Lymphs Abs: 2.4 10*3/uL (ref 0.7–4.0)
MCH: 29.1 pg (ref 26.0–34.0)
MCHC: 31.9 g/dL (ref 30.0–36.0)
MCV: 91.3 fL (ref 78.0–100.0)
MONO ABS: 2 10*3/uL — AB (ref 0.1–1.0)
Monocytes Relative: 12 %
NEUTROS PCT: 72 %
Neutro Abs: 12.2 10*3/uL — ABNORMAL HIGH (ref 1.7–7.7)
Platelets: 511 10*3/uL — ABNORMAL HIGH (ref 150–400)
RBC: 4.26 MIL/uL (ref 4.22–5.81)
RDW: 14.5 % (ref 11.5–15.5)
WBC: 17 10*3/uL — ABNORMAL HIGH (ref 4.0–10.5)

## 2017-09-12 LAB — GLUCOSE, CAPILLARY
GLUCOSE-CAPILLARY: 122 mg/dL — AB (ref 65–99)
Glucose-Capillary: 138 mg/dL — ABNORMAL HIGH (ref 65–99)
Glucose-Capillary: 151 mg/dL — ABNORMAL HIGH (ref 65–99)
Glucose-Capillary: 124 mg/dL — ABNORMAL HIGH (ref 65–99)

## 2017-09-12 LAB — BASIC METABOLIC PANEL
Anion gap: 9 (ref 5–15)
BUN: 20 mg/dL (ref 6–20)
CHLORIDE: 114 mmol/L — AB (ref 101–111)
CO2: 25 mmol/L (ref 22–32)
CREATININE: 0.98 mg/dL (ref 0.61–1.24)
Calcium: 9.2 mg/dL (ref 8.9–10.3)
GFR calc Af Amer: >60 mL/min (ref >60)
GFR calc non Af Amer: >60 mL/min (ref >60)
Glucose, Bld: 139 mg/dL — ABNORMAL HIGH (ref 65–99)
Potassium: 3.3 mmol/L — ABNORMAL LOW (ref 3.5–5.1)
Sodium: 148 mmol/L — ABNORMAL HIGH (ref 135–145)

## 2017-09-12 MED ORDER — MELATONIN 3 MG PO TABS
3.0000 mg | ORAL_TABLET | Freq: Every day | ORAL | Status: DC
  Administered 2017-09-12 – 2017-09-13 (×2): 3 mg via NASOGASTRIC
  Filled 2017-09-12 (×3): qty 1

## 2017-09-12 MED ORDER — LISINOPRIL 5 MG PO TABS
5.0000 mg | ORAL_TABLET | Freq: Every day | ORAL | Status: DC
  Administered 2017-09-12 – 2017-09-14 (×3): 5 mg
  Filled 2017-09-12 (×3): qty 1

## 2017-09-12 MED ORDER — NON FORMULARY: 3.0000 mg | Freq: Every day | Status: DC

## 2017-09-12 NOTE — Progress Notes (Signed)
   Subjective: 5 Days Post-Op Procedure(s) (LRB): EVACUATION OF NECK HEMATOMA (N/A) Patient reports pain as mild. Failed swallowing study yesterday per RN.  Repeat exam today. Dressing changed neck. He had collar off. Neck soft.     Objective: Vital signs in last 24 hours: Temp:  [98.1 F (36.7 C)-99.4 F (37.4 C)] 99.3 F (37.4 C) (12/12 0400) Pulse Rate:  [67-129] 95 (12/12 0600) Resp:  [15-35] 18 (12/12 0600) BP: (113-186)/(54-100) 174/79 (12/12 0600) SpO2:  [96 %-100 %] 98 % (12/12 0600) Weight:  [255 lb 4.7 oz (115.8 kg)] 255 lb 4.7 oz (115.8 kg) (12/12 0500)  Intake/Output from previous day: 12/11 0701 - 12/12 0700 In: 2050 [I.V.:103; ZO/XW:9604G/GT:1547; IV Piggyback:400] Out: 2175 [Urine:2175] Intake/Output this shift: No intake/output data recorded.  Recent Labs    09/10/17 0308 09/11/17 0332  HGB 11.5* 11.8*   Recent Labs    09/10/17 0308 09/11/17 0332  WBC 18.7* 16.6*  RBC 3.97* 4.06*  HCT 36.1* 37.0*  PLT 355 417*   Recent Labs    09/10/17 0308 09/11/17 0332  NA 142 145  K 3.6 3.4*  CL 105 110  CO2 25 24  BUN 14 14  CREATININE 0.98 1.00  GLUCOSE 144* 127*  CALCIUM 9.3 9.1   No results for input(s): LABPT, INR in the last 72 hours.  PE:    MAE to command. Sensation intact.  No results found.  Assessment/Plan: 5 Days Post-Op Procedure(s) (LRB): EVACUATION OF NECK HEMATOMA (N/A) Up with therapy, feeding tube until airway protection confirmed by swallowing study.   Eldred MangesMark C Loni Delbridge 09/12/2017, 7:24 AM
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
## 2017-09-12 NOTE — Progress Notes (Signed)
Pt. Transferred from ICU to room 13, intake complete. Pt hooked up to tube feed via core trak, hydralazine for prn BP (170/118) and versed given for a pain 6/10. Vitals complete. Pt is c/o swelling in his right arm and tingling in his left hand that radiates to his shoulder.  Spoke to Dr. Ophelia CharterYates, stated that patient had an infiltrated IV in the left forearm and that the swelling has reduced since incident. Also to reassure patient that the tingling in the left arm is fine and he will see him tomorrow.

## 2017-09-12 NOTE — Progress Notes (Signed)
Inpatient Rehabilitation  Per PT request, patient was screened by Janie Capp for appropriateness for an Inpatient Acute Rehab consult.  At this time we are recommending an Inpatient Rehab consult.  Please order if you are agreeable.    Labrandon Knoch, M.A., CCC/SLP Admission Coordinator  [facility]  Cell [phone]  
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
## 2017-09-12 NOTE — Progress Notes (Signed)
Pt asked to speak with me regarding his heart rate being 130. As I was walking in the room, pt had already called 911 saying that the Seroquel I gave him is causing his heart rate to increase. This was pt first night taking Seroquel and he blames it for causing his tachycardia. Patient is very anxious and somewhat delirious not being able to sleep the past couple days. Will continue to monitor
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
## 2017-09-12 NOTE — Progress Notes (Signed)
Patient ID: Vincent DyerSamuel Keith Dresser, male   DOB: 09/03/1976, 41 y.o.   MRN: [id number] [facility].  Ice chips not able to swallow, coughing . Continue NPO per swallowing study findings. 20 minute code with multiple intubation attempts and swelling .  Should continue to improve as swelling resolves. Continue tube feeding, Rehab consult ordered signed per PT recommendation.

## 2017-09-12 NOTE — Progress Notes (Signed)
  Speech Language Pathology Treatment: Dysphagia  Patient Details Name: Vincent Davis MRN: [id number] DOB: 05/29/1976 Today's Date: 09/12/2017 Time: [id number] SLP Time Calculation (min) (ACUTE ONLY): 28 min  Assessment / Plan / Recommendation Clinical Impression  F/u to determine readiness for POs- continues to present with persisting dysphagia with difficulty managing secretions.  Pt completed oral hygiene for himself, then consumed ice chips, which elicited consistent cough response.  Provided ongoing education re:the impact of ACDF upon swallowing, edema preventing full airway closure, and the need to be patient and allow more time for healing.  Pt verbalized understanding.  Will f/u next date for improvements.    HPI HPI: Pt is a 41 year old male who presented for elective ACDF 12/3. In post-op setting he developed respiratory distress with subsequent cardiac arrest, 20 mins PEA. He had a great deal of soft tissue swelling and hematoma on a CT scan of the neck. He underwent exploration with drainage of the hematoma on 12/ 7. He was intubated 12/3-12/9 but has had difficulty managing secretions post-extubation.      SLP Plan  Continue with current plan of care       Recommendations  Diet recommendations: NPO                Oral Care Recommendations: Oral care QID Follow up Recommendations: Other (comment)(tba) SLP Visit Diagnosis: Dysphagia, unspecified (R13.10) Plan: Continue with current plan of care       GO                Blenda MountsCouture, [name] 09/12/2017, 11:32 AM

## 2017-09-13 ENCOUNTER — Inpatient Hospital Stay (HOSPITAL_COMMUNITY): Payer: Self-pay

## 2017-09-13 DIAGNOSIS — I10 Essential (primary) hypertension: Secondary | ICD-10-CM

## 2017-09-13 DIAGNOSIS — D72829 Elevated white blood cell count, unspecified: Secondary | ICD-10-CM

## 2017-09-13 DIAGNOSIS — M4712 Other spondylosis with myelopathy, cervical region: Secondary | ICD-10-CM

## 2017-09-13 LAB — CBC WITH DIFFERENTIAL/PLATELET
BASOS ABS: 0 10*3/uL (ref 0.0–0.1)
Basophils Relative: 0 %
Eosinophils Absolute: 0.4 10*3/uL (ref 0.0–0.7)
Eosinophils Relative: 2 %
HCT: 39.5 % (ref 39.0–52.0)
HEMOGLOBIN: 12.6 g/dL — AB (ref 13.0–17.0)
LYMPHS ABS: 3.2 10*3/uL (ref 0.7–4.0)
Lymphocytes Relative: 16 %
MCH: 29.4 pg (ref 26.0–34.0)
MCHC: 31.9 g/dL (ref 30.0–36.0)
MCV: 92.1 fL (ref 78.0–100.0)
MONOS PCT: 8 %
Monocytes Absolute: 1.6 10*3/uL — ABNORMAL HIGH (ref 0.1–1.0)
NEUTROS ABS: 14.9 10*3/uL — AB (ref 1.7–7.7)
Neutrophils Relative %: 74 %
Platelets: 558 10*3/uL — ABNORMAL HIGH (ref 150–400)
RBC: 4.29 MIL/uL (ref 4.22–5.81)
RDW: 14.7 % (ref 11.5–15.5)
WBC: 20.1 10*3/uL — ABNORMAL HIGH (ref 4.0–10.5)

## 2017-09-13 LAB — BASIC METABOLIC PANEL
ANION GAP: 11 (ref 5–15)
BUN: 25 mg/dL — ABNORMAL HIGH (ref 6–20)
CHLORIDE: 113 mmol/L — AB (ref 101–111)
CO2: 24 mmol/L (ref 22–32)
CREATININE: 1.13 mg/dL (ref 0.61–1.24)
Calcium: 9.4 mg/dL (ref 8.9–10.3)
GFR calc non Af Amer: >60 mL/min (ref >60)
Glucose, Bld: 169 mg/dL — ABNORMAL HIGH (ref 65–99)
POTASSIUM: 3.3 mmol/L — AB (ref 3.5–5.1)
SODIUM: 148 mmol/L — AB (ref 135–145)

## 2017-09-13 LAB — GLUCOSE, CAPILLARY
GLUCOSE-CAPILLARY: 102 mg/dL — AB (ref 65–99)
GLUCOSE-CAPILLARY: 132 mg/dL — AB (ref 65–99)
GLUCOSE-CAPILLARY: 166 mg/dL — AB (ref 65–99)
Glucose-Capillary: 149 mg/dL — ABNORMAL HIGH (ref 65–99)
Glucose-Capillary: 138 mg/dL — ABNORMAL HIGH (ref 65–99)
Glucose-Capillary: 182 mg/dL — ABNORMAL HIGH (ref 65–99)

## 2017-09-13 LAB — URINALYSIS, ROUTINE W REFLEX MICROSCOPIC
BILIRUBIN URINE: NEGATIVE
Glucose, UA: NEGATIVE mg/dL
KETONES UR: NEGATIVE mg/dL
LEUKOCYTES UA: NEGATIVE
Nitrite: NEGATIVE
PROTEIN: NEGATIVE mg/dL
Specific Gravity, Urine: 1.013 (ref 1.005–1.030)
pH: 6 (ref 5.0–8.0)

## 2017-09-13 LAB — PROCALCITONIN: Procalcitonin: 0.1 ng/mL

## 2017-09-13 MED ORDER — PIPERACILLIN-TAZOBACTAM 3.375 G IVPB
3.3750 g | Freq: Three times a day (TID) | INTRAVENOUS | Status: DC
  Administered 2017-09-13 – 2017-09-14 (×3): 3.375 g via INTRAVENOUS
  Filled 2017-09-13 (×4): qty 50

## 2017-09-13 MED ORDER — LORAZEPAM 2 MG/ML IJ SOLN
0.5000 mg | Freq: Three times a day (TID) | INTRAMUSCULAR | Status: DC | PRN
  Administered 2017-09-13 – 2017-09-14 (×2): 0.5 mg via INTRAVENOUS
  Filled 2017-09-13 (×2): qty 1

## 2017-09-13 MED ORDER — VANCOMYCIN HCL IN DEXTROSE 1-5 GM/200ML-% IV SOLN
1000.0000 mg | Freq: Three times a day (TID) | INTRAVENOUS | Status: DC
  Administered 2017-09-13 – 2017-09-14 (×3): 1000 mg via INTRAVENOUS
  Filled 2017-09-13 (×3): qty 200

## 2017-09-13 MED ORDER — SODIUM CHLORIDE 0.9 % IV SOLN
2000.0000 mg | Freq: Once | INTRAVENOUS | Status: AC
  Administered 2017-09-13: 2000 mg via INTRAVENOUS
  Filled 2017-09-13: qty 2000

## 2017-09-13 MED ORDER — POTASSIUM CL IN DEXTROSE 5% 20 MEQ/L IV SOLN
20.0000 meq | INTRAVENOUS | Status: DC
  Administered 2017-09-13: 20 meq via INTRAVENOUS
  Filled 2017-09-13 (×3): qty 1000

## 2017-09-13 MED ORDER — AMLODIPINE BESYLATE 5 MG PO TABS
5.0000 mg | ORAL_TABLET | Freq: Every day | ORAL | Status: DC
  Administered 2017-09-13 – 2017-09-14 (×2): 5 mg via NASOGASTRIC
  Filled 2017-09-13 (×2): qty 1

## 2017-09-13 MED ORDER — POTASSIUM CHLORIDE 20 MEQ PO PACK
20.0000 meq | PACK | Freq: Four times a day (QID) | ORAL | Status: DC
  Administered 2017-09-13 – 2017-09-14 (×5): 20 meq via ORAL
  Filled 2017-09-13 (×6): qty 1

## 2017-09-13 NOTE — Progress Notes (Signed)
Pharmacy Antibiotic Note  Vincent Davis is a 41 y.o. male admitted on 09/03/2017 for cervical discectomy and fusion. He has had a prolonged admission due to unexpectedly going into respiratory arrest immediately after the procedure. He was found in PEA and eventually had ROSC. He has been on Unasyn for the past 10 days, but will now be escalated due to climbing white count, now up to 20. Possible sources: infectious hematoma vs aspiration?  Pt has retropharyngeal hematoma.  Plan: -Vancomycin 2 g IV x1 then 1g/8h -Zosyn 3.375 g IV q8h -Monitor renal fx, cultures, VT at Css   Height: 5\' 11"  (180.3 cm) Weight: 212 lb 15.4 oz (96.6 kg) IBW/kg (Calculated) : 75.3  Temp (24hrs), Avg:99 F (37.2 C), Min:98.3 F (36.8 C), Max:99.9 F (37.7 C)  Recent Labs  Lab 09/09/17 0418 09/09/17 0702 09/10/17 0308 09/11/17 0332 09/12/17 1431 09/13/17 0500  WBC  --  11.7* 18.7* 16.6* 17.0* 20.1*  CREATININE 0.87  --  0.98 1.00 0.98 1.13    Estimated Creatinine Clearance: 102 mL/min (by C-G formula based on SCr of 1.13 mg/dL).     Antimicrobials this admission: Cefazolin 12/3 > 12/4 12/3 Unasyn > 12/13 12/13 vancomycin > 12/13 zosyn >  Dose adjustments this admission: N/A  Microbiology results: 12/3 mrsa pcr: neg 12/3 resp cx: NF 12/10 cdiff: neg   Vincent Davis, Vincent HouseholderAlison Davis 09/13/2017 8:38 AM

## 2017-09-13 NOTE — Progress Notes (Signed)
Orthopedic Tech Progress Note Patient Details:  Vincent Davis 08/01/1976 [id number]  Ortho Devices Type of Ortho Device: Soft collar Ortho Device/Splint Location: neck Ortho Device/Splint Interventions: Ordered   Post Interventions Patient Tolerated: Well Instructions Provided: Care of device   Vincent Davis, Vincent Davis 09/13/2017, 3:42 PM

## 2017-09-13 NOTE — Progress Notes (Addendum)
  Speech Language Pathology Treatment: Dysphagia  Patient Details Name: Vincent Davis MRN: [id number] DOB: 09/04/1976 Today's Date: 09/13/2017 Time: [id number] SLP Time Calculation (min) (ACUTE ONLY): 27 min  Assessment / Plan / Recommendation Clinical Impression  Pt seen during and after MD visit. MD reported concern for increasing white blood count as well as possibility of needed assessment of possible fistula prior to being given barium. Pt independent in thorough oral care prior to PO trials. SLP offered ice chips following by occasional coughing and wet vocal quality though possible less severe then seen in prior visits.  Pt obviously ejecting any small quantity of penetrate with ice chip given good sensation and strong cough. Greater concern if aspiration of oral bacteria with secretions. Pt may have another 5 ice chips at noon after oral care and CHG rinse with RN supervision. Will f/u tomorrow for potential objective testing though will need discussion with Dr. Ophelia CharterYates first.    HPI HPI: Pt is a 41 year old male who presented for elective ACDF 12/3. In post-op setting he developed respiratory distress with subsequent cardiac arrest, 20 mins PEA. He had a great deal of soft tissue swelling and hematoma on a CT scan of the neck. He underwent exploration with drainage of the hematoma on 12/ 7. He was intubated 12/3-12/9 but has had difficulty managing secretions post-extubation.      SLP Plan  Continue with current plan of care       Recommendations  Diet recommendations: Other(comment)(ice chips once with RN at noon after oral care, CHG rinse)                Oral Care Recommendations: Oral care QID Plan: Continue with current plan of care       GO               [facility] Endoscopy CenterBonnie Jessel Gettinger, MA CCC-SLP [phone]  [name], [name] 09/13/2017, 8:43 AM
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
## 2017-09-13 NOTE — Progress Notes (Signed)
PULMONARY / CRITICAL CARE MEDICINE   Name: Vincent Davis MRN: [id number] DOB: 09/21/1976    ADMISSION DATE:  09/03/2017 CONSULTATION DATE:  09/03/2017  REFERRING MD:  Vincent Davis Ortho  CHIEF COMPLAINT:  PEA arrest  HISTORY OF PRESENT ILLNESS:   41 year old male with history of cervical spondylosis with radiculopathy. He presented 12/3 for elective repair under Vincent Davis. The surgery was without complication. In the postoperative setting he was doing well and recovering as expected with the exception of some respiratory secretions. Was conversant with nurse. She left him to get pain medication and upon returning a few minutes later she found him unresponsive and pulseless. PEA. He was coded off and on for 20 mins, but there was eventual return of spontaneous circulation that endured. He was transferred to ICU.  EKG showed no ischemic changes and there was no significant bump in his troponin.  CT scan of the neck showed  soft tissue swelling in the prevertebral space above the surgical repair and it appears that the PEA arrest was respiratory in origin secondary to airway compromise.  He was extubated on 12/9.    SUBJECTIVE:  We were asked to see him again today due to a persistent leukocytosis.  He denies cough or shortness of breath.  Is remarkably more alert with a normal voice quality today.       VITAL SIGNS: BP (!) 151/50   Pulse 90   Temp 99.1 F (37.3 C) (Oral)   Resp 16   Ht 5\' 11"  (1.803 m)   Wt 212 lb 15.4 oz (96.6 kg)   SpO2 99%   BMI 29.70 kg/m     INTAKE / OUTPUT:  Intake/Output Summary (Last 24 hours) at 09/13/2017 1018 Last data filed at 09/13/2017 0400 Gross per 24 hour  Intake 1293 ml  Output 250 ml  Net 1043 ml     PHYSICAL EXAMINATION: General: He is in no respiratory distress.  His voice quality is essentially normal today.    HEENT: Bilateral scleral hemorrhage noted.  He does not have stridor.  He has no drooling.   Pulmonary: He is unlabored  with symmetric air movement and no wheezes.  There are no rhonchi. Cardiac: Regular rate and rhythm without murmur rub or gallop Abdomen: Soft nontender no organomegaly positive bowel sounds tolerating tube feeds Extremities/musculoskeletal: Grip is 5/5 on the right and 4 5 on the left.     LABS:  BMET Recent Labs  Lab 09/11/17 0332 09/12/17 1431 09/13/17 0500  NA 145 148* 148*  K 3.4* 3.3* 3.3*  CL 110 114* 113*  CO2 24 25 24   BUN 14 20 25*  CREATININE 1.00 0.98 1.13  GLUCOSE 127* 139* 169*    Electrolytes Recent Labs  Lab 09/07/17 1432 09/08/17 0329 09/08/17 0841  09/11/17 0332 09/12/17 1431 09/13/17 0500  CALCIUM  --  8.5*  --    < > 9.1 9.2 9.4  MG  --   --  1.9  --  2.1  --   --   PHOS 2.8 2.2*  --   --   --   --   --    < > = values in this interval not displayed.    CBC Recent Labs  Lab 09/11/17 0332 09/12/17 1431 09/13/17 0500  WBC 16.6* 17.0* 20.1*  HGB 11.8* 12.4* 12.6*  HCT 37.0* 38.9* 39.5  PLT 417* 511* 558*    Coag's No results for input(s): APTT, INR in the last 168  hours.  Sepsis Markers Recent Labs  Lab 09/08/17 0841 09/09/17 0702 09/10/17 0308  PROCALCITON 0.16 0.10 <0.10    ABG No results for input(s): PHART, PCO2ART, PO2ART in the last 168 hours.  Liver Enzymes No results for input(s): AST, ALT, ALKPHOS, BILITOT, ALBUMIN in the last 168 hours.  Cardiac Enzymes Recent Labs  Lab 09/08/17 0329  TROPONINI <0.03    Glucose Recent Labs  Lab 09/12/17 1154 09/12/17 1547 09/12/17 2005 09/12/17 2353 09/13/17 0358 09/13/17 0801  GLUCAP 151* 122* 124* 102* 166* 149*    Imaging Dg Chest Port 1 View  Result Date: 09/13/2017 CLINICAL DATA:  Shortness of breath EXAM: PORTABLE CHEST 1 VIEW COMPARISON:  Portable chest x-ray of 09/11/2017 FINDINGS: No definite pneumonia is seen. The lungs are relatively well aerated. No pleural effusion is seen. Mediastinal and hilar contours are unremarkable and mild cardiomegaly is stable.  Feeding tube remains. A left PICC line is now present with the tip overlying the mid lower SVC. No pneumothorax is seen. A tubing overlies the right chest of uncertain significance. A lower anterior cervical spine fusion plate is present. IMPRESSION: 1. No definite active process. 2. Left PICC line tip overlies the mid lower SVC.  No pneumothorax. Electronically Signed   By: Vincent Davis  Vincent M.D.   On: 09/13/2017 09:56     STUDIES:  CT Soft Tissue Neck: 12/6>> Large retropharyngeal hematoma with progressive deep and superficial neck edema. Hemorrhage tracks along the upper thoracic esophagus. The hematoma has become more organized when compared to 2 days prior. C6-7 ACDF hardware in good position. Bilateral atelectasis.   CT soft tissues neck 12/4 >>> hematoma at surgical site.  Chest x-ray confirms  a well-placed endotracheal tube and some haziness at the  right base.  The NG tube projects well below the diaphragm.   CULTURES: Sputum cx 12/3 > abundant white blood cells no organisms  ANTIBIOTICS: Unasyn 12/3 >  SIGNIFICANT EVENTS: 12/3 admit, surgery, resp>cardiac arrest 20 mins. Intubated, to ICU 12/7 re-exploration of neck, I&D of old hematoma. Drain left in place.   LINES/TUBES: ETT 12/3 >  DISCUSSION: 41 year old male presented for elective C-spine surgery. In post op setting developed respiratory distress subsequently cardiac arrest. 20 mins PEA.  Arrest appears to be on the basis of airway compromise.  He had a great deal of soft tissue swelling and hematoma on a CT scan of the neck.  Expectant management resulted in essentially no resolution of the soft tissue swelling and he underwent exploration with drainage of the hematoma on 12/ 7.  He was able to breathe around the endotracheal tube with the cuff deflated 12/9 and he has been extubated.  We were asked to see him again today for leukocytosis.  There is no evidence by history or exam or chest x-ray of aspiration.  May very well  be a leukocytosis secondary to a bland hematoma in the neck, however we have sent a pro-calcitonin to help determine whether or not we may have an occult infection.  We have switched him from Unasyn to a combination of vancomycin and Zosyn which would cover urinary tract aspiration or wound infection.  We will follow-up on 12/14   Penny PiaWJ Gray, MD  [facility]monary/Critical Care Pager # [phone] OR # [phone] if no answer
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
## 2017-09-13 NOTE — Progress Notes (Signed)
I met with pt at bedside to discuss goals and expectations of an inpt rehab admission. Pt would like to discusses with his girlfriend. I await possible admit pending bed availability when pt medically ready. [phone]

## 2017-09-13 NOTE — Progress Notes (Signed)
Patient ID: Vincent Davis, male   DOB: 03-14-1976, 41 y.o.   MRN: [id number]  PROGRESS NOTE    Alontae Chaloux  WYO:[id number] DOB: Aug 28, 1976 DOA: 09/03/2017 PCP: Scot Jun, FNP   Brief Narrative:  41 year old male with history of cervical spondylosis with radiculopathy presented on 09/03/2017 for elective repair by Dr. Lorin Mercy.  Postprocedure, patient cardiac arrest/PEA status post resuscitation and was transferred to ICU on a ventilator.  There was no significant bump in his troponin.  CT scan of the neck showed soft tissue swelling in the prevertebral space about the surgical repair and it appeared that PEA arrest was respiratory in origin secondary to airway compromise.  He was extubated on 09/09/2017.  He had evacuation of neck hematoma by Dr. Lorin Mercy on 09/07/2017.  Patient currently has NG tube feeding.  Patient was transferred to hospitalist service on 09/13/2017.   Assessment & Plan:   Active Problems:   Cervical spinal stenosis   Pulmonary edema, acute (HCC)   Acute hypercapnic respiratory failure (HCC)   Cardiac arrest due to respiratory disorder (HCC)   Acute renal failure (HCC)   Status post cardiac arrest/PEA arrest status post resuscitation -Probably secondary to airway compromise -Echo on 09/04/2017 had shown ejection fraction of 65-70% with no regional wall motion abnormalities  Acute hypoxic respiratory failure status post extubation -PCCM following -Chest x-ray from today showed no acute infiltrates  Leukocytosis -Getting worse.  Questionable cause.  Has been empirically started on vancomycin and Zosyn today by PCCM. Patient was on Unasyn till today. Recent Cdiff was negative. No signs of any infection currently; iv site looks devoid of signs of thrombophlebitis. No signs of calf tenderness. Question of aspiration versus sinusitis as patient has been on NG tube.  - repeat AM labs. If worsening, then might have to do more imaging studies including CT of the  head. - Blood cultures. UA and urine cultures - no temperature spike   Cervical spondylosis with radiculopathy status post cervical 6-7 anterior cervical discectomy and fusion and subsequent drainage of neck hematoma -Currently has neck collar.  Orthopedics following  Dysphagia -Currently has NG tube feeding.  SLP following  Generalized deconditioning -Continue PT/OT.  Might need CIR placement  Thrombocytosis -Worsening.  Probably reactive - Repeat a.m. Labs  Hypertension -Blood pressure is on the higher side.  Patient is not on any antihypertensives at home -We will start amlodipine 5 mg daily and monitor  Hypokalemia -Replace.  Repeat a.m. labs   DVT prophylaxis: SCDs Code Status: Full Family Communication: None at bedside Disposition Plan: Probable CIR  Consultants: Orthopedics and PCCM  Procedures:  Echo on 09/04/2017 Study Conclusions  - Left ventricle: The cavity size was normal. Wall thickness was   increased in a pattern of moderate LVH. Systolic function was   vigorous. The estimated ejection fraction was in the range of 65%   to 70%. Wall motion was normal; there were no regional wall   motion abnormalities. - Pulmonary arteries: Systolic pressure was mildly increased. PA   peak pressure: 36 mm Hg (S).  cervical 6-7 anterior cervical discectomy and fusion on 09/03/2017 Drainage of neck hematoma on 09/07/2017  Antimicrobials: Anti-infectives (From admission, onward)   Start     Dose/Rate Route Frequency Ordered Stop   09/13/17 1800  vancomycin (VANCOCIN) IVPB 1000 mg/200 mL premix     1,000 mg 200 mL/hr over 60 Minutes Intravenous Every 8 hours 09/13/17 0843     09/13/17 1200  piperacillin-tazobactam (ZOSYN) IVPB 3.375 g  3.375 g 12.5 mL/hr over 240 Minutes Intravenous Every 8 hours 09/13/17 0843     09/13/17 1000  vancomycin (VANCOCIN) 2,000 mg in sodium chloride 0.9 % 500 mL IVPB     2,000 mg 250 mL/hr over 120 Minutes Intravenous  Once 09/13/17  0843 09/13/17 1215   09/03/17 2300  Ampicillin-Sulbactam (UNASYN) 3 g in sodium chloride 0.9 % 100 mL IVPB  Status:  Discontinued     3 g 200 mL/hr over 30 Minutes Intravenous Every 6 hours 09/03/17 2216 09/13/17 0825   09/03/17 2100  ceFAZolin (ANCEF) IVPB 1 g/50 mL premix     1 g 100 mL/hr over 30 Minutes Intravenous Every 8 hours 09/03/17 1551 09/04/17 1307   09/03/17 1000  ceFAZolin (ANCEF) IVPB 2g/100 mL premix     2 g 200 mL/hr over 30 Minutes Intravenous On call to O.R. 09/03/17 0954 09/03/17 1245       Subjective: Seen and examined at bedside.  He states that he is hungry.  Denies excessive diarrhea, abdominal pain, nausea or vomiting.  He has intermittent cough.  Objective: Vitals:   09/13/17 0613 09/13/17 0820 09/13/17 1215 09/13/17 1224  BP: (!) 151/50 (!) 169/117 137/61 137/61  Pulse: 90 (!) 123 (!) 107 (!) 107  Resp:  20  18  Temp:  98.8 F (37.1 C)  98.6 F (37 C)  TempSrc:  Oral  Oral  SpO2: 99% 95% 99% 99%  Weight:      Height:        Intake/Output Summary (Last 24 hours) at 09/13/2017 1253 Last data filed at 09/13/2017 0800 Gross per 24 hour  Intake 1073 ml  Output 500 ml  Net 573 ml   Filed Weights   09/11/17 0500 09/12/17 0500 09/13/17 0500  Weight: 116.8 kg (257 lb 8 oz) 115.8 kg (255 lb 4.7 oz) 96.6 kg (212 lb 15.4 oz)    Examination:  General exam: Appears calm and comfortable; has cervical collar Respiratory system: Bilateral decreased breath sound at bases Cardiovascular system: S1 & S2 heard, tachycardic  gastrointestinal system: Abdomen is nondistended, soft and nontender. Normal bowel sounds heard. Extremities: No cyanosis, clubbing, edema.  No calf tenderness  Data Reviewed: I have personally reviewed following labs and imaging studies  CBC: Recent Labs  Lab 09/09/17 0702 09/10/17 0308 09/11/17 0332 09/12/17 1431 09/13/17 0500  WBC 11.7* 18.7* 16.6* 17.0* 20.1*  NEUTROABS  --   --  12.4* 12.2* 14.9*  HGB 9.5* 11.5* 11.8*  12.4* 12.6*  HCT 30.1* 36.1* 37.0* 38.9* 39.5  MCV 90.7 90.9 91.1 91.3 92.1  PLT 261 355 417* 511* 161*   Basic Metabolic Panel: Recent Labs  Lab 09/07/17 1432 09/08/17 0329 09/08/17 0841 09/09/17 0418 09/09/17 0702 09/10/17 0308 09/11/17 0332 09/12/17 1431 09/13/17 0500  NA  --  137  --  136  --  142 145 148* 148*  K  --  3.4*  --  4.8 3.6 3.6 3.4* 3.3* 3.3*  CL  --  106  --  106  --  105 110 114* 113*  CO2  --  20*  --  20*  --  _0 GLUCOSE  --  118*  --  133*  --  144* 127* 139* 169*  BUN  --  14  --  16  --  _1 25*  CREATININE  --  0.94  --  0.87  --  0.98 1.00 0.98 1.13  CALCIUM  --  8.5*  --  8.1*  --  9.3 9.1 9.2 9.4  MG  --   --  1.9  --   --   --  2.1  --   --   PHOS 2.8 2.2*  --   --   --   --   --   --   --    GFR: Estimated Creatinine Clearance: 102 mL/min (by C-G formula based on SCr of 1.13 mg/dL). Liver Function Tests: No results for input(s): AST, ALT, ALKPHOS, BILITOT, PROT, ALBUMIN in the last 168 hours. No results for input(s): LIPASE, AMYLASE in the last 168 hours. No results for input(s): AMMONIA in the last 168 hours. Coagulation Profile: No results for input(s): INR, PROTIME in the last 168 hours. Cardiac Enzymes: Recent Labs  Lab 09/08/17 0329  TROPONINI <0.03   BNP (last 3 results) No results for input(s): PROBNP in the last 8760 hours. HbA1C: No results for input(s): HGBA1C in the last 72 hours. CBG: Recent Labs  Lab 09/12/17 2005 09/12/17 2353 09/13/17 0358 09/13/17 0801 09/13/17 1221  GLUCAP 124* 102* 166* 149* 138*   Lipid Profile: No results for input(s): CHOL, HDL, LDLCALC, TRIG, CHOLHDL, LDLDIRECT in the last 72 hours. Thyroid Function Tests: No results for input(s): TSH, T4TOTAL, FREET4, T3FREE, THYROIDAB in the last 72 hours. Anemia Panel: No results for input(s): VITAMINB12, FOLATE, FERRITIN, TIBC, IRON, RETICCTPCT in the last 72 hours. Sepsis Labs: Recent Labs  Lab 09/08/17 0841 09/09/17 0702  09/10/17 0308  PROCALCITON 0.16 0.10 <0.10    Recent Results (from the past 240 hour(s))  Culture, respiratory (NON-Expectorated)     Status: None   Collection Time: 09/03/17 10:11 PM  Result Value Ref Range Status   Specimen Description TRACHEAL ASPIRATE  Final   Special Requests NONE  Final   Gram Stain   Final    ABUNDANT WBC PRESENT,BOTH PMN AND MONONUCLEAR NO ORGANISMS SEEN    Culture Consistent with normal respiratory flora.  Final   Report Status 09/07/2017 FINAL  Final  C difficile quick scan w PCR reflex     Status: None   Collection Time: 09/10/17  2:25 PM  Result Value Ref Range Status   C Diff antigen NEGATIVE NEGATIVE Final   C Diff toxin NEGATIVE NEGATIVE Final   C Diff interpretation No C. difficile detected.  Final         Radiology Studies: Dg Chest Port 1 View  Result Date: 09/13/2017 CLINICAL DATA:  Shortness of breath EXAM: PORTABLE CHEST 1 VIEW COMPARISON:  Portable chest x-ray of 09/11/2017 FINDINGS: No definite pneumonia is seen. The lungs are relatively well aerated. No pleural effusion is seen. Mediastinal and hilar contours are unremarkable and mild cardiomegaly is stable. Feeding tube remains. A left PICC line is now present with the tip overlying the mid lower SVC. No pneumothorax is seen. A tubing overlies the right chest of uncertain significance. A lower anterior cervical spine fusion plate is present. IMPRESSION: 1. No definite active process. 2. Left PICC line tip overlies the mid lower SVC.  No pneumothorax. Electronically Signed   By: Ivar Drape M.D.   On: 09/13/2017 09:56        Scheduled Meds: . aspirin  81 mg Oral Daily  . chlorhexidine gluconate (MEDLINE KIT)  15 mL Mouth Rinse BID  . Chlorhexidine Gluconate Cloth  6 each Topical Daily  . docusate  100 mg Per Tube BID  . feeding supplement (PRO-STAT SUGAR FREE 64)  30 mL Per Tube BID  .  furosemide  20 mg Intravenous Q8H  . lisinopril  5 mg Per Tube Daily  . Melatonin  3 mg Per NG  tube QHS  . polyethylene glycol  17 g Oral Daily  . potassium chloride  20 mEq Oral Q6H  . sennosides  5 mL Per Tube QHS  . sodium chloride flush  3 mL Intravenous Q12H   Continuous Infusions: . dextrose 5 % with KCl 20 mEq / L 20 mEq (09/13/17 1154)  . famotidine (PEPCID) IV Stopped (09/13/17 1041)  . feeding supplement (JEVITY 1.2 CAL) 1,000 mL (09/12/17 1619)  . methocarbamol (ROBAXIN)  IV Stopped (09/10/17 1330)  . piperacillin-tazobactam (ZOSYN)  IV 3.375 g (09/13/17 1153)  . vancomycin       LOS: 9 days        Aline August, MD [facility] Pager [phone]  If 7PM-7AM, please contact night-coverage [email or web address] Password [facility] 09/13/2017, 12:53 PM

## 2017-09-13 NOTE — Progress Notes (Signed)
   Subjective: 6 Days Post-Op Procedure(s) (LRB): EVACUATION OF NECK HEMATOMA (N/A) Patient reports pain as mild.    Objective: Vital signs in last 24 hours: Temp:  [98.3 F (36.8 C)-99.9 F (37.7 C)] 99.1 F (37.3 C) (12/13 0354) Pulse Rate:  [72-110] 90 (12/13 0613) Resp:  [16-26] 16 (12/13 0354) BP: (130-174)/(47-118) 151/50 (12/13 0613) SpO2:  [93 %-100 %] 99 % (12/13 0613) Weight:  [212 lb 15.4 oz (96.6 kg)] 212 lb 15.4 oz (96.6 kg) (12/13 0500)  Intake/Output from previous day: 12/12 0701 - 12/13 0700 In: 1473 [I.V.:3; NG/GT:1020; IV Piggyback:450] Out: 250 [Urine:250] Intake/Output this shift: No intake/output data recorded.  Recent Labs    09/11/17 0332 09/12/17 1431 09/13/17 0500  HGB 11.8* 12.4* 12.6*   Recent Labs    09/12/17 1431 09/13/17 0500  WBC 17.0* 20.1*  RBC 4.26 4.29  HCT 38.9* 39.5  PLT 511* 558*   Recent Labs    09/12/17 1431 09/13/17 0500  NA 148* 148*  K 3.3* 3.3*  CL 114* 113*  CO2 25 24  BUN 20 25*  CREATININE 0.98 1.13  GLUCOSE 139* 169*  CALCIUM 9.2 9.4   No results for input(s): LABPT, INR in the last 72 hours.  Neurologically intact neck soft , incision no erythema. Ambulating . Sitting in a chair presently. Some forearm tenderness where IV infiltrated a few days ago.  Compartments soft.  No results found.  Assessment/Plan: 6 Days Post-Op Procedure(s) (LRB): EVACUATION OF NECK HEMATOMA (N/A)   WBC is slowly climbing which is a concern. Last CXR looked better with some atelectasis. Marland Kitchen. Speech therapist at bedside for repeat assessment. May need swallowing study.   I asked intensivist NP to have intensivist recheck pt today. Needs Hospitalist for starting Tx of HTN , etc.     Eldred MangesMark C Hever Castilleja 09/13/2017, 9:45 AM

## 2017-09-14 ENCOUNTER — Encounter (HOSPITAL_COMMUNITY): Payer: Self-pay

## 2017-09-14 ENCOUNTER — Inpatient Hospital Stay (HOSPITAL_COMMUNITY)
Admission: RE | Admit: 2017-09-14 | Discharge: 2017-09-19 | DRG: 091 | Disposition: A | Discharge status: Home / Self Care | Payer: Self-pay | Source: Intra-hospital | Attending: Physical Medicine & Rehabilitation | Admitting: Physical Medicine & Rehabilitation

## 2017-09-14 ENCOUNTER — Other Ambulatory Visit: Payer: Self-pay

## 2017-09-14 ENCOUNTER — Inpatient Hospital Stay (HOSPITAL_COMMUNITY): Payer: Self-pay

## 2017-09-14 DIAGNOSIS — J690 Pneumonitis due to inhalation of food and vomit: Secondary | ICD-10-CM | POA: Diagnosis present

## 2017-09-14 DIAGNOSIS — Z981 Arthrodesis status: Secondary | ICD-10-CM

## 2017-09-14 DIAGNOSIS — R2689 Other abnormalities of gait and mobility: Principal | ICD-10-CM | POA: Diagnosis present

## 2017-09-14 DIAGNOSIS — R1312 Dysphagia, oropharyngeal phase: Secondary | ICD-10-CM

## 2017-09-14 DIAGNOSIS — M5412 Radiculopathy, cervical region: Secondary | ICD-10-CM

## 2017-09-14 DIAGNOSIS — G959 Disease of spinal cord, unspecified: Secondary | ICD-10-CM

## 2017-09-14 DIAGNOSIS — T380X5A Adverse effect of glucocorticoids and synthetic analogues, initial encounter: Secondary | ICD-10-CM | POA: Diagnosis not present

## 2017-09-14 DIAGNOSIS — I10 Essential (primary) hypertension: Secondary | ICD-10-CM | POA: Diagnosis present

## 2017-09-14 DIAGNOSIS — I82611 Acute embolism and thrombosis of superficial veins of right upper extremity: Secondary | ICD-10-CM | POA: Diagnosis not present

## 2017-09-14 DIAGNOSIS — Z8674 Personal history of sudden cardiac arrest: Secondary | ICD-10-CM

## 2017-09-14 LAB — CBC WITH DIFFERENTIAL/PLATELET
Basophils Absolute: 0 10*3/uL (ref 0.0–0.1)
Basophils Relative: 0 %
EOS PCT: 3 %
Eosinophils Absolute: 0.6 10*3/uL (ref 0.0–0.7)
HEMATOCRIT: 38.8 % — AB (ref 39.0–52.0)
HEMOGLOBIN: 11.9 g/dL — AB (ref 13.0–17.0)
Lymphocytes Relative: 16 %
Lymphs Abs: 3.1 10*3/uL (ref 0.7–4.0)
MCH: 28.7 pg (ref 26.0–34.0)
MCHC: 30.7 g/dL (ref 30.0–36.0)
MCV: 93.7 fL (ref 78.0–100.0)
MONOS PCT: 5 %
Monocytes Absolute: 1 10*3/uL (ref 0.1–1.0)
NEUTROS PCT: 76 %
Neutro Abs: 14.4 10*3/uL — ABNORMAL HIGH (ref 1.7–7.7)
Platelets: 551 10*3/uL — ABNORMAL HIGH (ref 150–400)
RBC: 4.14 MIL/uL — AB (ref 4.22–5.81)
RDW: 15.2 % (ref 11.5–15.5)
WBC: 19.1 10*3/uL — AB (ref 4.0–10.5)

## 2017-09-14 LAB — COMPREHENSIVE METABOLIC PANEL
ALBUMIN: 3.6 g/dL (ref 3.5–5.0)
ALK PHOS: 53 U/L (ref 38–126)
ALT: 80 U/L — ABNORMAL HIGH (ref 17–63)
AST: 68 U/L — AB (ref 15–41)
Anion gap: 9 (ref 5–15)
BILIRUBIN TOTAL: 1 mg/dL (ref 0.3–1.2)
BUN: 23 mg/dL — AB (ref 6–20)
CO2: 26 mmol/L (ref 22–32)
Calcium: 9.2 mg/dL (ref 8.9–10.3)
Chloride: 114 mmol/L — ABNORMAL HIGH (ref 101–111)
Creatinine, Ser: 1.23 mg/dL (ref 0.61–1.24)
GFR calc Af Amer: >60 mL/min (ref >60)
GFR calc non Af Amer: >60 mL/min (ref >60)
GLUCOSE: 141 mg/dL — AB (ref 65–99)
POTASSIUM: 3.7 mmol/L (ref 3.5–5.1)
Sodium: 149 mmol/L — ABNORMAL HIGH (ref 135–145)
TOTAL PROTEIN: 7.4 g/dL (ref 6.5–8.1)

## 2017-09-14 LAB — URINE CULTURE: Culture: NO GROWTH

## 2017-09-14 LAB — PROCALCITONIN: Procalcitonin: <0.1 ng/mL

## 2017-09-14 LAB — GLUCOSE, CAPILLARY
GLUCOSE-CAPILLARY: 138 mg/dL — AB (ref 65–99)
GLUCOSE-CAPILLARY: 129 mg/dL — AB (ref 65–99)
GLUCOSE-CAPILLARY: 136 mg/dL — AB (ref 65–99)
GLUCOSE-CAPILLARY: 145 mg/dL — AB (ref 65–99)
Glucose-Capillary: 139 mg/dL — ABNORMAL HIGH (ref 65–99)
Glucose-Capillary: 176 mg/dL — ABNORMAL HIGH (ref 65–99)

## 2017-09-14 LAB — C-REACTIVE PROTEIN: CRP: 1.5 mg/dL — ABNORMAL HIGH (ref <1.0)

## 2017-09-14 MED ORDER — AMLODIPINE BESYLATE 5 MG PO TABS: 5.0000 mg | ORAL_TABLET | Freq: Every day | ORAL | 0 refills | Status: DC

## 2017-09-14 MED ORDER — ALPRAZOLAM 0.25 MG PO TABS
0.2500 mg | ORAL_TABLET | Freq: Three times a day (TID) | ORAL | Status: DC | PRN
  Administered 2017-09-14 – 2017-09-17 (×3): 0.25 mg via NASOGASTRIC
  Filled 2017-09-14 (×3): qty 1

## 2017-09-14 MED ORDER — SENNOSIDES 8.8 MG/5ML PO SYRP: 5.0000 mL | ORAL_SOLUTION | Freq: Every day | ORAL | 0 refills | Status: DC

## 2017-09-14 MED ORDER — LISINOPRIL 5 MG PO TABS
5.0000 mg | ORAL_TABLET | Freq: Every day | ORAL | Status: DC
  Administered 2017-09-15 – 2017-09-18 (×4): 5 mg
  Filled 2017-09-14 (×4): qty 1

## 2017-09-14 MED ORDER — MELATONIN 3 MG PO TABS
3.0000 mg | ORAL_TABLET | Freq: Every day | ORAL | Status: DC
  Administered 2017-09-14 – 2017-09-16 (×3): 3 mg via NASOGASTRIC
  Filled 2017-09-14 (×4): qty 1

## 2017-09-14 MED ORDER — DOCUSATE SODIUM 50 MG/5ML PO LIQD: 100.0000 mg | Freq: Two times a day (BID) | ORAL | 0 refills | Status: DC

## 2017-09-14 MED ORDER — ACETAMINOPHEN 325 MG PO TABS: 650.0000 mg | ORAL_TABLET | ORAL | Status: DC | PRN

## 2017-09-14 MED ORDER — JEVITY 1.2 CAL PO LIQD: 1000.0000 mL | ORAL | 0 refills | Status: DC

## 2017-09-14 MED ORDER — JEVITY 1.2 CAL PO LIQD: 1000.0000 mL | ORAL | Status: DC

## 2017-09-14 MED ORDER — MENTHOL 3 MG MT LOZG
1.0000 | LOZENGE | OROMUCOSAL | 12 refills | Status: DC | PRN
Start: 2017-09-14 — End: 2017-09-19

## 2017-09-14 MED ORDER — SENNOSIDES 8.8 MG/5ML PO SYRP
5.0000 mL | ORAL_SOLUTION | Freq: Every day | ORAL | Status: DC
  Administered 2017-09-14: 5 mL
  Filled 2017-09-14 (×4): qty 5

## 2017-09-14 MED ORDER — ASPIRIN 81 MG PO CHEW: 81.0000 mg | CHEWABLE_TABLET | Freq: Every day | ORAL | 0 refills | Status: DC

## 2017-09-14 MED ORDER — PRO-STAT SUGAR FREE PO LIQD: 30.0000 mL | Freq: Two times a day (BID) | ORAL | 0 refills | Status: DC

## 2017-09-14 MED ORDER — METHOCARBAMOL 1000 MG/10ML IJ SOLN: 500.0000 mg | Freq: Four times a day (QID) | INTRAVENOUS | Status: DC | PRN

## 2017-09-14 MED ORDER — LISINOPRIL 5 MG PO TABS: 5.0000 mg | ORAL_TABLET | Freq: Every day | ORAL | 0 refills | Status: DC

## 2017-09-14 MED ORDER — ASPIRIN 81 MG PO CHEW
81.0000 mg | CHEWABLE_TABLET | Freq: Every day | ORAL | Status: DC
  Administered 2017-09-15 – 2017-09-18 (×4): 81 mg
  Filled 2017-09-14 (×4): qty 1

## 2017-09-14 MED ORDER — AMLODIPINE BESYLATE 5 MG PO TABS
5.0000 mg | ORAL_TABLET | Freq: Every day | ORAL | Status: DC
  Administered 2017-09-15 – 2017-09-18 (×4): 5 mg via NASOGASTRIC
  Filled 2017-09-14 (×4): qty 1

## 2017-09-14 MED ORDER — ACETAMINOPHEN 650 MG RE SUPP: 650.0000 mg | RECTAL | Status: DC | PRN

## 2017-09-14 MED ORDER — PRO-STAT SUGAR FREE PO LIQD
30.0000 mL | Freq: Two times a day (BID) | ORAL | Status: DC
  Administered 2017-09-14: 30 mL via ORAL
  Filled 2017-09-14: qty 30

## 2017-09-14 MED ORDER — CHLORHEXIDINE GLUCONATE CLOTH 2 % EX PADS: 6.0000 | MEDICATED_PAD | Freq: Every day | CUTANEOUS | 0 refills | Status: DC

## 2017-09-14 MED ORDER — MELATONIN 3 MG PO TABS: 3.0000 mg | ORAL_TABLET | Freq: Every day | ORAL | 0 refills | Status: DC

## 2017-09-14 MED ORDER — FAMOTIDINE IN NACL 20-0.9 MG/50ML-% IV SOLN: 20.0000 mg | Freq: Two times a day (BID) | INTRAVENOUS | 0 refills | Status: DC

## 2017-09-14 MED ORDER — METHOCARBAMOL 500 MG PO TABS: 500.0000 mg | ORAL_TABLET | Freq: Four times a day (QID) | ORAL | 0 refills | Status: DC | PRN

## 2017-09-14 MED ORDER — HYDRALAZINE HCL 20 MG/ML IJ SOLN: 10.0000 mg | INTRAMUSCULAR | 0 refills | Status: DC | PRN

## 2017-09-14 MED ORDER — CHLORHEXIDINE GLUCONATE 0.12% ORAL RINSE (MEDLINE KIT): 15.0000 mL | Freq: Two times a day (BID) | OROMUCOSAL | 0 refills | Status: DC

## 2017-09-14 MED ORDER — METHOCARBAMOL 500 MG PO TABS
500.0000 mg | ORAL_TABLET | Freq: Four times a day (QID) | ORAL | Status: DC | PRN
  Administered 2017-09-15: 500 mg via ORAL
  Filled 2017-09-14 (×2): qty 1

## 2017-09-14 MED ORDER — FUROSEMIDE 10 MG/ML IJ SOLN: 20.0000 mg | Freq: Three times a day (TID) | INTRAMUSCULAR | 0 refills | Status: DC

## 2017-09-14 MED ORDER — DOCUSATE SODIUM 50 MG/5ML PO LIQD
100.0000 mg | Freq: Two times a day (BID) | ORAL | Status: DC
  Administered 2017-09-14 – 2017-09-16 (×2): 100 mg
  Filled 2017-09-14 (×7): qty 10

## 2017-09-14 MED ORDER — VITAL HIGH PROTEIN PO LIQD: 1000.0000 mL | ORAL | Status: DC

## 2017-09-14 MED ORDER — JEVITY 1.2 CAL PO LIQD
1000.0000 mL | ORAL | Status: DC
  Filled 2017-09-14 (×2): qty 1000

## 2017-09-14 MED ORDER — JEVITY 1.2 CAL PO LIQD
1000.0000 mL | ORAL | Status: DC
  Administered 2017-09-14: 1000 mL
  Filled 2017-09-14 (×4): qty 1000

## 2017-09-14 MED ORDER — POLYETHYLENE GLYCOL 3350 17 G PO PACK
17.0000 g | PACK | Freq: Every day | ORAL | Status: DC
  Filled 2017-09-14 (×2): qty 1

## 2017-09-14 MED ORDER — POLYETHYLENE GLYCOL 3350 17 G PO PACK: 17.0000 g | PACK | Freq: Every day | ORAL | 0 refills | Status: DC
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
## 2017-09-14 NOTE — Progress Notes (Signed)
  Speech Language Pathology Treatment: Dysphagia  Patient Details Name: Hinton DyerSamuel Keith Sharpe MRN: [id number] DOB: 05/01/1976 Today's Date: 09/14/2017 Time: 0950-1000 SLP Time Calculation (min) (ACUTE ONLY): 10 min  Assessment / Plan / Recommendation Clinical Impression  Pt with persisting difficulty swallowing.  He is completing oral care independently.  Ice chips continue to elicit wet vocal quality and coughing. Recommend proceeding with MBS today to elucidate etiology of deficit.  Received orders to proceed from Dr. Ophelia CharterYates.  Scheduled for 11:00 am.   HPI HPI: Pt is a 41 year old male who presented for elective ACDF 12/3. In post-op setting he developed respiratory distress with subsequent cardiac arrest, 20 mins PEA. He had a great deal of soft tissue swelling and hematoma on a CT scan of the neck. He underwent exploration with drainage of the hematoma on 12/ 7. He was intubated 12/3-12/9 but has had difficulty managing secretions post-extubation.      SLP Plan  Continue with current plan of care;MBS       Recommendations                   Plan: Continue with current plan of care;MBS       GO                Blenda MountsCouture, Raman Featherston Laurice 09/14/2017, 10:09 AM  Marchelle FolksAmanda L. Samson Fredericouture, [place]MA CCC/SLP Pager [phone]
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
## 2017-09-14 NOTE — Progress Notes (Signed)
Nutrition Follow-up  DOCUMENTATION CODES:   Obesity unspecified  INTERVENTION:   Once in IP rehab, Recommend increasing Jevity 1.2 formula via Cortrak NGT to new goal rate of 75 ml/hr x 20 hours (may hold TF for up to 4 hours for therapy) with 30 ml Prostat to provide 2000 kcal (100% of needs), 113 grams of protein, and 1215 ml water.   Once IV fluids are discontinued, Recommend free water flushes of 200 ml TID. Total free water: 1815 ml/day  NUTRITION DIAGNOSIS:   Inadequate oral intake related to inability to eat as evidenced by NPO status; ongoing  GOAL:   Patient will meet greater than or equal to 90% of their needs; met  MONITOR:   TF tolerance, I & O's  REASON FOR ASSESSMENT:   Consult, Ventilator Enteral/tube feeding initiation and management  ASSESSMENT:   Pt with PMH of cervical spondylosis with radiculopathy admitted 12/3 for elective repair. Post op pt developed respiratory distress with cardiac arrest.   12/7 Cortrak placed 12/9 extubated Failed swallow evaluation today   Plans for discharge to CIR today. Pt has been tolerating his tube feeds with no other difficulties. Recommend modifying tube feeds to run over 20 hours to allow 4 hours off TF for therapy. Additionally recommend addition of free water flushes when IV fluids are discontinued.   Diet Order:  Diet NPO time specified  EDUCATION NEEDS:   No education needs have been identified at this time  Skin:  Skin Assessment: Skin Integrity Issues: Skin Integrity Issues:: Incisions Incisions: neck  Last BM:  12/13  Height:   Ht Readings from Last 1 Encounters:  09/12/17 5' 11"  (1.803 m)    Weight:   Wt Readings from Last 1 Encounters:  09/14/17 220 lb 14.4 oz (100.2 kg)    Ideal Body Weight:  78.1 kg  BMI:  Body mass index is 30.81 kg/m.  Estimated Nutritional Needs:   Kcal:  1900-2100  Protein:  95-115 grams  Fluid:  > 1.9 L/day    Corrin Parker, MS, RD, LDN Pager #  [phone] After hours/ weekend pager # [phone]

## 2017-09-14 NOTE — Care Management Note (Signed)
Case Management Note  Patient Details  Name: Vincent Davis MRN: [id number] Date of Birth: 02/11/1976  Subjective/Objective:  Pt admitted on 09/03/17 s/p C6-7 cervical fusion with post operative PEA/respiratory arrest due to airway compromise.  PTA, pt resided at home with significant other.                   Action/Plan: PT/OT consults pending.  Will follow for discharge planning as pt progresses.    Expected Discharge Date:  09/14/17        Expected Discharge Plan:  IP Rehab Facility  In-House Referral:     Discharge planning Services  CM Consult  Post Acute Care Choice:    Choice offered to:     DME Arranged:    DME Agency:     HH Arranged:    HH Agency:     Status of Service:  Completed, signed off  If discussed at [employer] Meetings, dates discussed:    Additional Comments:  09/14/17 J. Tamsen Reist, RN, BSN Pt medically stable for dc and [facility]  Plan dc to CIR later today.   Quintella BatonJulie W. Seanna Sisler, RN, BSN  Trauma/Neuro ICU Case Manager [phone]
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
## 2017-09-14 NOTE — Progress Notes (Signed)
Patient arrived to unit and oriented to unit protocol. Patient verbalized understanding of need to call for staff assistance, but also got out of bed multiple times to access belongings in the room. Discussed safety plan with significant other and alerted CN. Patient resting in room with call bell in place and bed alarm set.
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
## 2017-09-14 NOTE — Progress Notes (Signed)
PULMONARY / CRITICAL CARE MEDICINE   Name: Vincent Davis MRN: [id number] DOB: 10/21/1975    ADMISSION DATE:  09/03/2017 CONSULTATION DATE:  09/03/2017  REFERRING MD:  Dr. Carolyne [facility]lesYates [facility]  CHIEF COMPLAINT:  PEA arrest  BRIEF SUMMARY:   41 year old male with history of cervical spondylosis with radiculopathy. He presented 12/3 for elective repair under Dr. Ophelia CharterYates. The surgery was without complication. In the postoperative setting he was doing well and recovering as expected with the exception of some respiratory secretions. Was conversant with nurse. She left him to get pain medication and upon returning a few minutes later she found him unresponsive and pulseless in PEA. He was coded off and on for 20 mins, but there was eventual return of spontaneous circulation that endured. He was transferred to ICU.  EKG showed no ischemic changes and there was no significant bump in his troponin.  CT scan of the neck showed  soft tissue swelling in the prevertebral space above the surgical repair and it appears that the PEA arrest was respiratory in origin secondary to airway compromise.  He was extubated on 12/9.  SUBJECTIVE:  Pt denies cough / sputum production / nasal drainage.  States he is managing his secretions well / not requiring suction.  Afebrile. WBC stable.  PCT negative.   VITAL SIGNS: BP (!) 159/97 (BP Location: Left Leg)   Pulse (!) 103   Temp 98.3 F (36.8 C) (Oral)   Resp 16   Ht 5\' 11"  (1.803 m)   Wt 220 lb 14.4 oz (100.2 kg)   SpO2 99%   BMI 30.81 kg/m   INTAKE / OUTPUT:  Intake/Output Summary (Last 24 hours) at 09/14/2017 1047 Last data filed at 09/14/2017 0400 Gross per 24 hour  Intake 2666.5 ml  Output -  Net 2666.5 ml     PHYSICAL EXAMINATION: General:  Young adult male in NAD, sitting up in chair HEENT: MM pink/moist, soft collar in place, NGT, managing secretions  Neuro:  AAOx4, speech clear, MAE CV: s1s2 rrr, no m/r/g PULM: even/non-labored, lungs bilaterally  clear  OZ:HYQMGI:soft, non-tender, bsx4 active  Extremities: warm/dry, no edema  Skin: no rashes or lesions  LABS:  BMET Recent Labs  Lab 09/12/17 1431 09/13/17 0500 09/14/17 0608  NA 148* 148* 149*  K 3.3* 3.3* 3.7  CL 114* 113* 114*  CO2 25 24 26   BUN 20 25* 23*  CREATININE 0.98 1.13 1.23  GLUCOSE 139* 169* 141*    Electrolytes Recent Labs  Lab 09/07/17 1432 09/08/17 0329 09/08/17 0841  09/11/17 0332 09/12/17 1431 09/13/17 0500 09/14/17 0608  CALCIUM  --  8.5*  --    < > 9.1 9.2 9.4 9.2  MG  --   --  1.9  --  2.1  --   --   --   PHOS 2.8 2.2*  --   --   --   --   --   --    < > = values in this interval not displayed.    CBC Recent Labs  Lab 09/12/17 1431 09/13/17 0500 09/14/17 0608  WBC 17.0* 20.1* 19.1*  HGB 12.4* 12.6* 11.9*  HCT 38.9* 39.5 38.8*  PLT 511* 558* 551*    Coag's No results for input(s): APTT, INR in the last 168 hours.  Sepsis Markers Recent Labs  Lab 09/10/17 0308 09/13/17 1033 09/14/17 0608  PROCALCITON <0.10 0.10 <0.10    ABG No results for input(s): PHART, PCO2ART, PO2ART in the last 168 hours.  Liver Enzymes Recent Labs  Lab 09/14/17 0608  AST 68*  ALT 80*  ALKPHOS 53  BILITOT 1.0  ALBUMIN 3.6    Cardiac Enzymes Recent Labs  Lab 09/08/17 0329  TROPONINI <0.03    Glucose Recent Labs  Lab 09/13/17 1221 09/13/17 1616 09/13/17 1948 09/13/17 2314 09/14/17 0402 09/14/17 0835  GLUCAP 138* 182* 132* 139* 176* 138*    Imaging No results found.   STUDIES:  CT Soft Tissue Neck: 12/6 >> Large retropharyngeal hematoma with progressive deep and superficial neck edema. Hemorrhage tracks along the upper thoracic esophagus. The hematoma has become more organized when compared to 2 days prior.  C6-7 ACDF hardware in good position. Bilateral atelectasis.  CT soft tissues neck 12/4 >> hematoma at surgical site.  Chest x-ray confirms  a well-placed endotracheal tube and some haziness at the  right base.  The NG tube  projects well below the diaphragm.   CULTURES: Sputum Cx 12/3 > abundant white blood cells no organisms  ANTIBIOTICS: Unasyn 12/3 > 12/13  Vanco 12/13 >> 12/14  Zosyn 12/13 >> 12/14   SIGNIFICANT EVENTS: 12/03  admit, surgery, resp>cardiac arrest 20 mins. Intubated, to ICU 12/07  re-exploration of neck, I&D of old hematoma. Drain left in place.   LINES/TUBES: ETT 12/3 >>  DISCUSSION: 41 year old male presented for elective C-spine surgery. In post op setting developed respiratory distress subsequently cardiac arrest. 20 mins PEA.  Arrest appears to be on the basis of airway compromise.  He had a great deal of soft tissue swelling and hematoma on a CT scan of the neck.  Expectant management resulted in essentially no resolution of the soft tissue swelling and he underwent exploration with drainage of the hematoma on 12/ 7.  He was able to breathe around the endotracheal tube with the cuff deflated 12/9 and he has been extubated.    Leukocytosis - PCT negative, afebrile.  Suspect this is likely reactive related to hematoma in neck.  CXR is clear.  Pt denies calf tenderness, nasal drainage.  He denies sinus tenderness but must also consider sinusitis.    Plan: Discontinue abx  Monitor fever curve / WBC trend off  Push incentive spirometry, mobilize.  SLP efforts appreciated [facility]  PCCM will sign off.  Please call back if new needs arise Canary BrimBrandi Tayia Stonesifer, NP-C [facility] Pgr: [phone] or if no answer [phone] 09/14/2017, 11:03 AM

## 2017-09-14 NOTE — Progress Notes (Signed)
Modified Barium Swallow Progress Note  Patient Details  Name: Hinton DyerSamuel Keith Moulin MRN: [id number] Date of Birth: 08/05/1976  Today's Date: 09/14/2017  Modified Barium Swallow completed.  Full report located under Chart Review in the Imaging Section.  Brief recommendations include the following:  Clinical Impression  Pt presents with a mechanical dysphagia secondary to posterior pharyngeal wall edema.  There is adequate anterior and superior mobility of the hyolaryngeal complex, but edema impedes epiglottic inversion over the larynx.  Insufficient laryngeal vestibular closure leads to immediate aspiration of 1/2 tspn-sized boluses of thin and nectar-thick liquid.  Recommend continued NPO; allow occasional ice chips after oral care.  Prognosis for recovery good.  Pt verbalized understanding but is not happy with outcome of study.    Swallow Evaluation Recommendations       SLP Diet Recommendations: NPO;Ice chips PRN after oral care                       Oral Care Recommendations: Oral care QID        Blenda Mountsouture, Braidyn Peace Laurice 09/14/2017,1:13 PM

## 2017-09-14 NOTE — Discharge Summary (Signed)
Physician Discharge Summary  Patient ID: Vincent Davis MRN: [id number] DOB/AGE: 03-22-1976 41 y.o.  Admit date: 09/03/2017 Discharge date: 09/14/2017  Admission Diagnoses:  Discharge Diagnoses:  Active Problems:   Cervical spinal stenosis   Pulmonary edema, acute (HCC)   Acute hypercapnic respiratory failure (HCC)   Cardiac arrest due to respiratory disorder (HCC)   Acute renal failure ([place])   Discharged Condition: Optimized.  Hospital Course: Patient is a 41 year old African American male with past medical history significant for cervical spondylosis with radiculopathy. Patient was admitted on 09/03/2017 for elective surgery by Dr. Lorin Mercy.  Postprocedure, patient had cardiac arrest (PEA Arrest), status post resuscitation and was transferred to ICU on a ventilator.  CT scan of the neck showed soft tissue swelling in the prevertebral space about the surgical repair and it appeared that PEA arrest was respiratory in origin secondary to airway compromise.  Patient was extubated on 09/09/2017.  Patient had evacuation of neck hematoma by Dr. Lorin Mercy on 09/07/2017.  Patient currently has NG tube feeding.  Patient was transferred to hospitalist service on 09/13/2017. Patient has persistent leukocytosis without any known infective source. Patient is on Zosyn. Procalcitonin level done today was within normal limit, and patient has been afebrile. Kindly monitor WBC, and any other constitutional symptoms that may be suggestive of an infective process. Patient has been cleared for discharge to rehabilitation facility. Kindly follow water balance and sodium level.   Consults: Neurosurgery    Discharge Exam: Blood pressure (!) 159/97, pulse 84, temperature 98.1 F (36.7 C), temperature source Oral, resp. rate 17, height 5' 11"  (1.803 m), weight 100.2 kg (220 lb 14.4 oz), SpO2 99 %.   Disposition: 01-Home or Self Care  Discharge Instructions    Activity as tolerated - No restrictions   Complete by:   As directed    Diet general   Complete by:  As directed    Continue tube feeds Jevity at 60cc/hour   Increase activity slowly   Complete by:  As directed      Allergies as of 09/14/2017   No Known Allergies     Medication List    STOP taking these medications   acetaminophen 500 MG tablet Commonly known as:  TYLENOL   cyclobenzaprine 10 MG tablet Commonly known as:  FLEXERIL   gabapentin 300 MG capsule Commonly known as:  NEURONTIN   HYDROcodone-acetaminophen 5-325 MG tablet Commonly known as:  NORCO/VICODIN   ketorolac 0.5 % ophthalmic solution Commonly known as:  ACULAR   naproxen 500 MG tablet Commonly known as:  NAPROSYN   oxyCODONE-acetaminophen 5-325 MG tablet Commonly known as:  PERCOCET Replaced by:  oxyCODONE-acetaminophen 7.5-325 MG tablet   traMADol 50 MG tablet Commonly known as:  ULTRAM     TAKE these medications   amLODipine 5 MG tablet Commonly known as:  NORVASC 1 tablet (5 mg total) by Per NG tube route daily. Start taking on:  09/15/2017   aspirin 81 MG chewable tablet Chew 1 tablet (81 mg total) by mouth daily. Start taking on:  09/15/2017   chlorhexidine gluconate (MEDLINE KIT) 0.12 % solution Commonly known as:  PERIDEX 15 mLs by Mouth Rinse route 2 (two) times daily.   Chlorhexidine Gluconate Cloth 2 % Pads Apply 6 each topically daily.   docusate 50 MG/5ML liquid Commonly known as:  COLACE Place 10 mLs (100 mg total) into feeding tube 2 (two) times daily.   famotidine 20-0.9 MG/50ML-% Commonly known as:  PEPCID Inject 50 mLs (20 mg total) into  the vein every 12 (twelve) hours.   feeding supplement (JEVITY 1.2 CAL) Liqd Place 1,000 mLs into feeding tube continuous.   feeding supplement (PRO-STAT SUGAR FREE 64) Liqd Take 30 mLs by mouth 2 (two) times daily.   furosemide 10 MG/ML injection Commonly known as:  LASIX Inject 2 mLs (20 mg total) into the vein every 8 (eight) hours.   hydrALAZINE 20 MG/ML injection Commonly  known as:  APRESOLINE Inject 0.5 mLs (10 mg total) into the vein every 4 (four) hours as needed (give prn SBP greater than 160).   lisinopril 5 MG tablet Commonly known as:  PRINIVIL,ZESTRIL Place 1 tablet (5 mg total) into feeding tube daily. Start taking on:  09/15/2017   Melatonin 3 MG Tabs 1 tablet (3 mg total) by Per NG tube route at bedtime.   menthol-cetylpyridinium 3 MG lozenge Commonly known as:  CEPACOL Take 1 lozenge (3 mg total) by mouth as needed for sore throat (sore throat).   methocarbamol 500 MG tablet Commonly known as:  ROBAXIN Take 1 tablet (500 mg total) by mouth every 6 (six) hours as needed for muscle spasms. What changed:    when to take this  reasons to take this   oxyCODONE-acetaminophen 7.5-325 MG tablet Commonly known as:  PERCOCET Take 1 tablet by mouth every 6 (six) hours as needed for severe pain. Replaces:  oxyCODONE-acetaminophen 5-325 MG tablet   polyethylene glycol packet Commonly known as:  MIRALAX / GLYCOLAX Take 17 g by mouth daily. Start taking on:  09/15/2017   sennosides 8.8 MG/5ML syrup Commonly known as:  SENOKOT Place 5 mLs into feeding tube at bedtime.        SignedBonnell Public 09/14/2017, 3:55 PM

## 2017-09-14 NOTE — H&P (Signed)
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 Mood: Provide emotional support 5. Neuropsych: This patient is capable of making decisions on his own behalf. 6. Skin/Wound Care: Routine skin checks 7. Fluids/Electrolytes/Nutrition: Routine I&O's with follow-up chemistries 8. Hypertension. Norvasc 5 mg daily, lisinopril 5 mg daily 9. Dysphagia. Follow-up speech therapy. Patient with nasogastric tube. Continue TF  -adv per SLP 10. Aspiration pneumonia/leukocytosis currently on Zosyn and vancomycin. Latest blood cultures no growth   Post Admission Physician Evaluation: 1. Functional deficits secondary  to cervical myelopathy. 2. Patient is admitted to receive collaborative, interdisciplinary care between the physiatrist, rehab nursing staff, and therapy team. 3. Patient's level of medical complexity and substantial therapy needs in context of that medical necessity cannot be provided at a lesser intensity of care such as a  SNF. 4. Patient has experienced substantial functional loss from his/her baseline which was documented above under the "Functional History" and "Functional Status" headings.  Judging by the patient's diagnosis, physical exam, and functional history, the patient has potential for functional progress which will result in measurable gains while on inpatient rehab.  These gains will be of substantial and practical use upon discharge  in facilitating mobility and self-care at the household level. 5. Physiatrist will provide 24 hour management of medical needs as well as oversight of the therapy plan/treatment and provide guidance as appropriate regarding the interaction of the two. 6. The Preadmission Screening has been reviewed and patient status is unchanged unless otherwise stated above. 7. 24 hour rehab nursing will assist with bladder management, bowel management, safety, skin/wound care, disease management, medication administration, pain management and patient education  and help integrate therapy concepts, techniques,education, etc. 8. PT will assess and treat for/with: Lower extremity strength, range of motion, stamina, balance, functional mobility, safety, adaptive techniques and equipment , NMR, pain control.   Goals are: mod I. 9. OT will assess and treat for/with: ADL's, functional mobility, safety, upper extremity strength, adaptive techniques and equipment , NMR.   Goals are: mod I. Therapy may proceed with showering this patient. 10. SLP will assess and treat for/with: swallowing, nutrition.  Goals are: mod I to supervision. 11. Case Management and Social Worker will assess and treat for psychological issues and discharge planning. 12. Team conference will be held weekly to assess progress toward goals and to determine barriers to discharge. 13. Patient will receive at least 3 hours of therapy per day at least 5 days per week. 14. ELOS: 8-12 days       15. Prognosis:   excellent     Meredith Staggers, MD, [facility] 12/14/2018Alger Simons T, MD 09/14/2017

## 2017-09-14 NOTE — Progress Notes (Signed)
Inpt rehab bed is available to admit pt to today. I contacted Dr. Dartha Lodgegbata and he is in agreement to d/c today. I will make the arrangements to admit pt to day and pt is in agreement. RN CM made aware.[phone]
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
## 2017-09-14 NOTE — Progress Notes (Signed)
Rehab coordinator called and said that a spot became open so patient will transfer to rehab today. Report was given to Hillary, Charity BraidwoodfundraiserN. Patient was backed up IV saline locked and PICC line locked. Patient had questions about medications and feedings being continued. I told patient he will be readmitted and have everything started over on the floor. All belongings gathered and patient sent to 4W Room 23.

## 2017-09-15 ENCOUNTER — Inpatient Hospital Stay (HOSPITAL_COMMUNITY): Payer: No Typology Code available for payment source | Admitting: Occupational Therapy

## 2017-09-15 ENCOUNTER — Inpatient Hospital Stay (HOSPITAL_COMMUNITY): Payer: No Typology Code available for payment source | Admitting: Physical Therapy

## 2017-09-15 ENCOUNTER — Inpatient Hospital Stay (HOSPITAL_COMMUNITY): Payer: No Typology Code available for payment source | Admitting: Speech Pathology

## 2017-09-15 DIAGNOSIS — J690 Pneumonitis due to inhalation of food and vomit: Secondary | ICD-10-CM

## 2017-09-15 LAB — GLUCOSE, CAPILLARY
GLUCOSE-CAPILLARY: 129 mg/dL — AB (ref 65–99)
GLUCOSE-CAPILLARY: 151 mg/dL — AB (ref 65–99)
Glucose-Capillary: 113 mg/dL — ABNORMAL HIGH (ref 65–99)
Glucose-Capillary: 164 mg/dL — ABNORMAL HIGH (ref 65–99)
Glucose-Capillary: 96 mg/dL (ref 65–99)
Glucose-Capillary: 126 mg/dL — ABNORMAL HIGH (ref 65–99)

## 2017-09-15 MED ORDER — FREE WATER
200.0000 mL | Freq: Three times a day (TID) | Status: DC
  Administered 2017-09-15 – 2017-09-18 (×10): 200 mL

## 2017-09-15 MED ORDER — JEVITY 1.2 CAL PO LIQD
1000.0000 mL | ORAL | Status: DC
Start: 2017-09-15 — End: 2017-09-18
  Administered 2017-09-15 – 2017-09-17 (×5): 1000 mL
  Filled 2017-09-15: qty 237
  Filled 2017-09-15 (×6): qty 1000
  Filled 2017-09-15: qty 237
  Filled 2017-09-15 (×3): qty 1000

## 2017-09-15 MED ORDER — TRAMADOL HCL 50 MG PO TABS
50.0000 mg | ORAL_TABLET | Freq: Four times a day (QID) | ORAL | Status: DC | PRN
  Administered 2017-09-15 – 2017-09-17 (×6): 50 mg
  Filled 2017-09-15 (×6): qty 1

## 2017-09-15 MED ORDER — SODIUM CHLORIDE 0.9% FLUSH
10.0000 mL | INTRAVENOUS | Status: DC | PRN
  Administered 2017-09-16 – 2017-09-18 (×2): 10 mL
  Filled 2017-09-15 (×2): qty 40

## 2017-09-15 MED ORDER — PRO-STAT SUGAR FREE PO LIQD
30.0000 mL | Freq: Two times a day (BID) | ORAL | Status: DC
  Administered 2017-09-15 – 2017-09-19 (×9): 30 mL
  Filled 2017-09-15 (×9): qty 30

## 2017-09-15 MED ORDER — TRAMADOL 5 MG/ML ORAL SUSPENSION: 50.0000 mg | Freq: Four times a day (QID) | ORAL | Status: DC | PRN

## 2017-09-15 NOTE — Progress Notes (Signed)
[place] PHYSICAL MEDICINE & REHABILITATION     PROGRESS NOTE    Subjective/Complaints: States he is hungry.  Wants some cranberry juice by mouth.  Stools have slowed down.  Pain is under poor control and ranging from a 6-8 out of 10  ROS: pt denies nausea, vomiting, diarrhea, cough, shortness of breath or chest pain   Objective: Vital Signs: Blood pressure (!) 144/69, pulse 82, temperature 98.6 F (37 C), temperature source Oral, resp. rate 18, height 5\' 11"  (1.803 m), weight 98.6 kg (217 lb 6 oz), SpO2 100 %. Dg Swallowing Func-speech Pathology  Result Date: 09/14/2017 Objective Swallowing Evaluation: Type of Study: MBS-Modified Barium Swallow Study  Patient Details Name: Vincent Davis MRN: [id number] Date of Birth: 01/31/1976 Today's Date: 09/14/2017 Time: SLP Start Time (ACUTE ONLY): 1130 -SLP Stop Time (ACUTE ONLY): 1150 SLP Time Calculation (min) (ACUTE ONLY): 20 min Past Medical History: Past Medical History: Diagnosis Date . Cervical spondylosis with radiculopathy  . Complication of anesthesia   no previous surgery . Family history of adverse reaction to anesthesia   "I don't know." . Obesity (BMI 30.0-34.9)  . Pneumonia   as a child Past Surgical History: Past Surgical History: Procedure Laterality Date . ANTERIOR CERVICAL DECOMP/DISCECTOMY FUSION N/A 09/03/2017  Procedure: CERVICAL SIX-SEVEN  ANTERIOR CERVICAL DISCECTOMY AND FUSION, ALLOGRAFT, PLATE;  Surgeon: Eldred Manges, MD;  Location: [facility] OR;  Service: Orthopedics;  Laterality: N/A; . EVACUATION OF CERVICAL HEMATOMA N/A 09/07/2017  Procedure: EVACUATION OF NECK HEMATOMA;  Surgeon: Eldred Manges, MD;  Location: [facility] OR;  Service: Orthopedics;  Laterality: N/A; . NO PAST SURGERIES   HPI: Pt is a 41 year old male who presented for elective ACDF 12/3. In post-op setting he developed respiratory distress with subsequent cardiac arrest, 20 mins PEA. He had a great deal of soft tissue swelling and hematoma on a CT scan of the neck. He  underwent exploration with drainage of the hematoma on 12/ 7. He was intubated 12/3-12/9 but has had difficulty managing secretions post-extubation.  Subjective: alert Assessment / Plan / Recommendation CHL IP CLINICAL IMPRESSIONS 09/14/2017 Clinical Impression Pt presents with a mechanical dysphagia secondary to posterior pharyngeal wall edema.  There is adequate anterior and superior mobility of the hyolaryngeal complex, but edema impedes epiglottic inversion over the larynx.  Insufficient laryngeal vestibular closure leads to immediate aspiration of 1/2 tspn-sized boluses of thin and nectar-thick liquid.  Recommend continued NPO; allow occasional ice chips after oral care.  Prognosis for recovery good.  Pt verbalized understanding but is not happy with outcome of study.  SLP Visit Diagnosis Dysphagia, pharyngeal phase (R13.13) Attention and concentration deficit following -- Frontal lobe and executive function deficit following -- Impact on safety and function Severe aspiration risk   CHL IP TREATMENT RECOMMENDATION 09/11/2017 Treatment Recommendations Therapy as outlined in treatment plan below   Prognosis 09/11/2017 Prognosis for Safe Diet Advancement Good Barriers to Reach Goals Severity of deficits Barriers/Prognosis Comment -- CHL IP DIET RECOMMENDATION 09/14/2017 SLP Diet Recommendations NPO;Ice chips PRN after oral care Liquid Administration via -- Medication Administration -- Compensations -- Postural Changes --   CHL IP OTHER RECOMMENDATIONS 09/14/2017 Recommended Consults -- Oral Care Recommendations Oral care QID Other Recommendations --   CHL IP FOLLOW UP RECOMMENDATIONS 09/12/2017 Follow up Recommendations Other (comment)   CHL IP FREQUENCY AND DURATION 09/11/2017 Speech Therapy Frequency (ACUTE ONLY) min 3x week Treatment Duration 2 weeks      CHL IP ORAL PHASE 09/14/2017 Oral Phase WFL Oral -  Pudding Teaspoon -- Oral - Pudding Cup -- Oral - Honey Teaspoon -- Oral - Honey Cup -- Oral - Nectar  Teaspoon -- Oral - Nectar Cup -- Oral - Nectar Straw -- Oral - Thin Teaspoon -- Oral - Thin Cup -- Oral - Thin Straw -- Oral - Puree -- Oral - Mech Soft -- Oral - Regular -- Oral - Multi-Consistency -- Oral - Pill -- Oral Phase - Comment --  CHL IP PHARYNGEAL PHASE 09/14/2017 Pharyngeal Phase Impaired Pharyngeal- Pudding Teaspoon -- Pharyngeal -- Pharyngeal- Pudding Cup -- Pharyngeal -- Pharyngeal- Honey Teaspoon -- Pharyngeal -- Pharyngeal- Honey Cup -- Pharyngeal -- Pharyngeal- Nectar Teaspoon Reduced pharyngeal peristalsis;Reduced epiglottic inversion;Penetration/Aspiration before swallow;Moderate aspiration;Pharyngeal residue - valleculae Pharyngeal Material enters airway, passes BELOW cords and not ejected out despite cough attempt by patient Pharyngeal- Nectar Cup -- Pharyngeal -- Pharyngeal- Nectar Straw -- Pharyngeal -- Pharyngeal- Thin Teaspoon Reduced pharyngeal peristalsis;Reduced epiglottic inversion;Penetration/Aspiration before swallow;Moderate aspiration;Pharyngeal residue - valleculae Pharyngeal Material enters airway, passes BELOW cords and not ejected out despite cough attempt by patient Pharyngeal- Thin Cup -- Pharyngeal -- Pharyngeal- Thin Straw -- Pharyngeal -- Pharyngeal- Puree -- Pharyngeal -- Pharyngeal- Mechanical Soft -- Pharyngeal -- Pharyngeal- Regular -- Pharyngeal -- Pharyngeal- Multi-consistency -- Pharyngeal -- Pharyngeal- Pill -- Pharyngeal -- Pharyngeal Comment --  No flowsheet data found. No flowsheet data found. Blenda MountsCouture, Amanda Laurice 09/14/2017, 1:14 PM              Recent Labs    09/13/17 0500 09/14/17 0608  WBC 20.1* 19.1*  HGB 12.6* 11.9*  HCT 39.5 38.8*  PLT 558* 551*   Recent Labs    09/13/17 0500 09/14/17 0608  NA 148* 149*  K 3.3* 3.7  CL 113* 114*  GLUCOSE 169* 141*  BUN 25* 23*  CREATININE 1.13 1.23  CALCIUM 9.4 9.2   CBG (last 3)  Recent Labs    09/14/17 2144 09/15/17 0016 09/15/17 0354  GLUCAP 145* 151* 129*    Wt Readings from Last 3  Encounters:  09/15/17 98.6 kg (217 lb 6 oz)  09/14/17 100.2 kg (220 lb 14.4 oz)  08/28/17 107.5 kg (237 lb)    Physical Exam:  Constitutional: He appears well-developed.  HENT:  Head: Normocephalic.  Nasogastric tube in place.  Voice is wet Eyes: EOM are normal.  Neck:  Cervical collar in place  Cardiovascular: RRR without murmur. No JVD  Respiratory:CTA Bilaterally without wheezes or rales. Normal effort  GI: Soft. Bowel sounds are normal. He exhibits no distension.  Skin  Warm and dry Neurological: He isalert.    Oriented to person place and age. Upper extremity motor grossly 3+-4 out of 5 proximal distal. He has decreased sensation in both arms and hands. Lower extremity strength is grossly 3-3+ out of 5 with hip flexion and knee extension and 4 out of 5 ankle dorsiflexion and plantar flexion with decreased pain and light touch in both legs Psych: anxious    Assessment/Plan: 1.  Functional and mobility deficits secondary to cervical stenosis with myelopathy which require 3+ hours per day of interdisciplinary therapy in a comprehensive inpatient rehab setting. Physiatrist is providing close team supervision and 24 hour management of active medical problems listed below. Physiatrist and rehab team continue to assess barriers to discharge/monitor patient progress toward functional and medical goals.  Function:  Bathing Bathing position      Bathing parts      Bathing assist        Upper Body Dressing/Undressing Upper body dressing  Upper body assist        Lower Body Dressing/Undressing Lower body dressing                                  Lower body assist        Toileting Toileting          Toileting assist     Transfers Chair/bed transfer             Locomotion Ambulation           Wheelchair          Cognition Comprehension Comprehension assist level: Follows complex conversation/direction with  no assist  Expression Expression assist level: Expresses complex ideas: With no assist  Social Interaction Social Interaction assist level: Interacts appropriately with others with medication or extra time (anti-anxiety, antidepressant).  Problem Solving Problem solving assist level: Solves complex problems: Recognizes & self-corrects  Memory Memory assist level: Complete Independence: No helper   Medical Problem List and Plan: 1.  Myelopathy with decreased functional mobility secondary to cervical stenosis status post decompression/ACDF C6-7 09/03/2017 with postoperative hematoma status post evacuation 09/07/2017 complicated by cardiopulmonary arrest with acute hypoxemia. Soft cervical collar at all times             -Beginning therapies today 2.  DVT Prophylaxis/Anticoagulation: SCDs. Check vascular study 3. Pain Management: Robaxin as needed.  Add tramadol for more severe pain 4. Mood: Provide emotional support 5. Neuropsych: This patient is capable of making decisions on his own behalf. 6. Skin/Wound Care: Routine skin checks 7. Fluids/Electrolytes/Nutrition:    -Increased tube feeds to 85 cc/h.  Begin water flushes through 2    -Allow patient to have ice chips via spoon for pleasure   -Check labs tomorrow 8. Hypertension. Norvasc 5 mg daily, lisinopril 5 mg daily 9. Dysphagia. Follow-up speech therapy. Patient with nasogastric tube. Continue TF             -adv per SLP 10. Aspiration pneumonia/leukocytosis currently on Zosyn and vancomycin. Latest blood cultures no growth   LOS (Days) 1 A FACE TO FACE EVALUATION WAS PERFORMED  Ranelle OysterSWARTZ,ZACHARY T, MD 09/15/2017 9:52 AM
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
## 2017-09-15 NOTE — Progress Notes (Signed)
Nutrition Brief Note  RD consulted regarding initiation of tube feeding.   Patient was seen yesterday by RD who covers IP rehab. She had placed recommendations for tube feeding once he was transferred to IP rehab:  Once in IP rehab, Recommend increasing Jevity 1.2 formula via Cortrak NGT to new goal rate of 75 ml/hr x 20 hours (may hold TF for up to 4 hours for therapy) with 30 ml Prostat to provide 2000 kcal (100% of needs), 113 grams of protein, and 1215 ml water.   Once IV fluids are discontinued, Recommend free water flushes of 200 ml TID. Total free water: 1815 ml/day  Will implement these recommendations.    Christophe LouisNathan Franks RD, LDN, CNSC Clinical Nutrition Pager: [phone] 09/15/2017 9:29 AM
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
## 2017-09-16 ENCOUNTER — Inpatient Hospital Stay (HOSPITAL_COMMUNITY): Payer: Self-pay

## 2017-09-16 DIAGNOSIS — M7989 Other specified soft tissue disorders: Secondary | ICD-10-CM

## 2017-09-16 LAB — CBC WITH DIFFERENTIAL/PLATELET
BASOS PCT: 0 %
Basophils Absolute: 0 10*3/uL (ref 0.0–0.1)
EOS ABS: 0.9 10*3/uL — AB (ref 0.0–0.7)
EOS PCT: 5 %
HEMATOCRIT: 40.5 % (ref 39.0–52.0)
Hemoglobin: 12.6 g/dL — ABNORMAL LOW (ref 13.0–17.0)
LYMPHS ABS: 3.6 10*3/uL (ref 0.7–4.0)
Lymphocytes Relative: 20 %
MCH: 29.4 pg (ref 26.0–34.0)
MCHC: 31.1 g/dL (ref 30.0–36.0)
MCV: 94.6 fL (ref 78.0–100.0)
MONO ABS: 1.1 10*3/uL — AB (ref 0.1–1.0)
Monocytes Relative: 6 %
NEUTROS ABS: 12.2 10*3/uL — AB (ref 1.7–7.7)
NEUTROS PCT: 69 %
PLATELETS: 516 10*3/uL — AB (ref 150–400)
RBC: 4.28 MIL/uL (ref 4.22–5.81)
RDW: 15 % (ref 11.5–15.5)
WBC: 17.8 10*3/uL — ABNORMAL HIGH (ref 4.0–10.5)

## 2017-09-16 LAB — GLUCOSE, CAPILLARY
GLUCOSE-CAPILLARY: 131 mg/dL — AB (ref 65–99)
GLUCOSE-CAPILLARY: 134 mg/dL — AB (ref 65–99)
GLUCOSE-CAPILLARY: 160 mg/dL — AB (ref 65–99)
Glucose-Capillary: 127 mg/dL — ABNORMAL HIGH (ref 65–99)
Glucose-Capillary: 143 mg/dL — ABNORMAL HIGH (ref 65–99)
Glucose-Capillary: 172 mg/dL — ABNORMAL HIGH (ref 65–99)
Glucose-Capillary: 145 mg/dL — ABNORMAL HIGH (ref 65–99)

## 2017-09-16 LAB — COMPREHENSIVE METABOLIC PANEL
ALT: 141 U/L — ABNORMAL HIGH (ref 17–63)
AST: 81 U/L — AB (ref 15–41)
Albumin: 3.7 g/dL (ref 3.5–5.0)
Alkaline Phosphatase: 68 U/L (ref 38–126)
Anion gap: 9 (ref 5–15)
BILIRUBIN TOTAL: 0.4 mg/dL (ref 0.3–1.2)
BUN: 24 mg/dL — AB (ref 6–20)
CHLORIDE: 103 mmol/L (ref 101–111)
CO2: 27 mmol/L (ref 22–32)
Calcium: 9.2 mg/dL (ref 8.9–10.3)
Creatinine, Ser: 1 mg/dL (ref 0.61–1.24)
Glucose, Bld: 132 mg/dL — ABNORMAL HIGH (ref 65–99)
POTASSIUM: 3.7 mmol/L (ref 3.5–5.1)
Sodium: 139 mmol/L (ref 135–145)
TOTAL PROTEIN: 7.8 g/dL (ref 6.5–8.1)

## 2017-09-16 MED ORDER — PREDNISONE 5 MG/5ML PO SOLN
20.0000 mg | Freq: Two times a day (BID) | ORAL | Status: DC
  Administered 2017-09-16 – 2017-09-18 (×5): 20 mg
  Filled 2017-09-16 (×6): qty 20

## 2017-09-16 NOTE — IPOC Note (Signed)
Overall Plan of Care [facility](IPOC) Patient Details Name: Vincent Davis MRN: [id number] DOB: 11/13/1975  Admitting Diagnosis: <principal problem not specified>cer vical myelopathy  Hospital Problems: Active Problems:   Cervical myelopathy (HCC)   Oropharyngeal dysphagia     Functional Problem List: Nursing Behavior, Bowel, Endurance, Medication Management, Nutrition, Pain, Safety  PT Balance, Endurance, Motor, Safety, Perception, Pain  OT Balance, Motor, Pain, Safety, Endurance  SLP Nutrition  TR         Basic ADL's: OT Bathing, Grooming, Dressing, Toileting     Advanced  ADL's: OT       Transfers: PT Bed Mobility, Bed to Chair, Furniture, Floor, Customer service managerCar  OT Toilet, Research scientist (life sciences)Tub/Shower     Locomotion: PT Ambulation, Psychologist, prison and probation servicesWheelchair Mobility, Stairs     Additional Impairments: OT Fuctional Use of Upper Extremity  SLP Swallowing      TR      Anticipated Outcomes Item Anticipated Outcome  Self Feeding    Swallowing  mod I with least restrictive diet    Basic self-care  Mod I  Toileting  Mod I   Bathroom Transfers Mod I  Bowel/Bladder  Remain continent with regular bowel and voiding patterns Mod I  Transfers  Mod I   Locomotion  Mod I   Communication     Cognition     Pain  Less than 4  Safety/Judgment  Remain free of falls, skin breakdown and infection with mod assist   Therapy Plan: PT Intensity: Minimum of 1-2 x/day ,45 to 90 minutes PT Frequency: 5 out of 7 days PT Duration Estimated Length of Stay: 5-7 days  OT Intensity: Minimum of 1-2 x/day, 45 to 90 minutes OT Frequency: 5 out of 7 days OT Duration/Estimated Length of Stay: 4-7 days SLP Intensity: Minumum of 1-2 x/day, 30 to 90 minutes SLP Frequency: 3 to 5 out of 7 days SLP Duration/Estimated Length of Stay: 7 days     Team Interventions: Nursing Interventions Patient/Family Education, Bowel Management, Pain Management, Medication Management, Skin Care/Wound Management, Discharge Planning, Psychosocial  Support  PT interventions Ambulation/gait training, Disease management/prevention, Pain management, Stair training, Visual/perceptual remediation/compensation, Wheelchair propulsion/positioning, Therapeutic Activities, Patient/family education, DME/adaptive equipment instruction, Warden/rangerBalance/vestibular training, Cognitive remediation/compensation, Functional electrical stimulation, Psychosocial support, Therapeutic Exercise, UE/LE Strength taining/ROM, Skin care/wound management, Functional mobility training, Community reintegration, Discharge planning, Neuromuscular re-education, Splinting/orthotics, UE/LE Coordination activities  OT Interventions Cognitive remediation/compensation, Warden/rangerBalance/vestibular training, FirefighterCommunity reintegration, Discharge planning, DME/adaptive equipment instruction, Functional mobility training, Neuromuscular re-education, Pain management, Patient/family education, Self Care/advanced ADL retraining, Psychosocial support, Therapeutic Activities, Therapeutic Exercise, UE/LE Strength taining/ROM, UE/LE Coordination activities  SLP Interventions Dysphagia/aspiration precaution training  TR Interventions    SW/CM Interventions Discharge Planning, Psychosocial Support, Patient/Family Education   Barriers to Discharge MD  Medical stability  Nursing      PT Decreased caregiver support, Lack of/limited family support girlfriend works during day  OT      SLP Nutrition means    SW       Team Discharge Planning: Destination: PT-Home ,OT- Home , SLP-Home Projected Follow-up: PT-Outpatient PT, OT-  Home health OT, SLP-Other (comment)(TBD pending progress made while inpatient ) Projected Equipment Needs: PT-To be determined, OT- To be determined, SLP-None recommended by SLP Equipment Details: PT-TBD, OT-  Patient/family involved in discharge planning: PT- Patient,  OT-Patient, SLP-Patient  MD ELOS: 7 days Medical Rehab Prognosis:  Good Assessment: The patient has been admitted for  CIR therapies with the diagnosis of cervical myelpathy. The team will be addressing functional mobility, strength, stamina, balance,  safety, adaptive techniques and equipment, self-care, bowel and bladder mgt, patient and caregiver education, NMR, pain control, swallowing/nutrition. Goals have been set at mod I for mobility and self-care as well as swallowing. Currently NPO and swallowing will be rate-limiting factor for discharge.    Ranelle OysterZachary T. Gotti Alwin, MD, FAAPMR      See Team Conference Notes for weekly updates to the plan of care

## 2017-09-16 NOTE — Progress Notes (Signed)
[place] [facility]ICINE & REH[facility]ILITATION     PROGRESS NOTE    Subjective/Complaints: Patient stating that he still is having difficulty swallowing and feels that his throat is more swollen today.  Does feel fuller with increases in tube feeds and use of flushes.  Would like to have a grounds pass  ROS: pt denies nausea, vomiting, diarrhea, cough, shortness of breath or chest pain    Objective: Vital Signs: Blood pressure 134/63, pulse 66, temperature 98.3 F (36.8 C), temperature source Oral, resp. rate 18, height 5\' 11"  (1.803 m), weight 99.9 kg (220 lb 3.8 oz), SpO2 99 %. Dg Swallowing Func-speech Pathology  Result Date: 09/14/2017 Objective Swallowing Evaluation: Type of Study: MBS-Modified Barium Swallow Study  Patient Details Name: Vincent Davis MRN: [id number] Date of Birth: 1976-09-16 Today's Date: 09/14/2017 Time: SLP Start Time (ACUTE ONLY): 1130 -SLP Stop Time (ACUTE ONLY): 1150 SLP Time Calculation (min) (ACUTE ONLY): 20 min Past Medical History: Past Medical History: Diagnosis Date . Cervical spondylosis with radiculopathy  . Complication of anesthesia   no previous surgery . Family history of adverse reaction to anesthesia   "I don't know." . Obesity (BMI 30.0-34.9)  . Pneumonia   as a child Past Surgical History: Past Surgical History: Procedure Laterality Date . ANTERIOR CERVICAL DECOMP/DISCECTOMY FUSION N/A 09/03/2017  Procedure: CERVICAL SIX-SEVEN  ANTERIOR CERVICAL DISCECTOMY AND FUSION, ALLOGRAFT, PLATE;  Surgeon: Eldred Manges, MD;  Location: [facility] OR;  Service: Orthopedics;  Laterality: N/A; . EVACUATION OF CERVICAL HEMATOMA N/A 09/07/2017  Procedure: EVACUATION OF NECK HEMATOMA;  Surgeon: Eldred Manges, MD;  Location: [facility] OR;  Service: Orthopedics;  Laterality: N/A; . NO PAST SURGERIES   HPI: Pt is a 41 year old male who presented for elective ACDF 12/3. In post-op setting he developed respiratory distress with subsequent cardiac arrest, 20 mins PEA. He had a great deal of  soft tissue swelling and hematoma on a CT scan of the neck. He underwent exploration with drainage of the hematoma on 12/ 7. He was intubated 12/3-12/9 but has had difficulty managing secretions post-extubation.  Subjective: alert Assessment / Plan / Recommendation [facility] IP CLINICAL IMPRESSIONS 09/14/2017 Clinical Impression Pt presents with a mechanical dysphagia secondary to posterior pharyngeal wall edema.  There is adequate anterior and superior mobility of the hyolaryngeal complex, but edema impedes epiglottic inversion over the larynx.  Insufficient laryngeal vestibular closure leads to immediate aspiration of 1/2 tspn-sized boluses of thin and nectar-thick liquid.  Recommend continued NPO; allow occasional ice chips after oral care.  Prognosis for recovery good.  Pt verbalized understanding but is not happy with outcome of study.  SLP Visit Diagnosis Dysphagia, pharyngeal phase (R13.13) Attention and concentration deficit following -- Frontal lobe and executive function deficit following -- Impact on safety and function Severe aspiration risk   [facility] IP TREATMENT RECOMMENDATION 09/11/2017 Treatment Recommendations Therapy as outlined in treatment plan below   Prognosis 09/11/2017 Prognosis for Safe Diet Advancement Good Barriers to Reach Goals Severity of deficits Barriers/Prognosis Comment -- [facility] IP DIET RECOMMENDATION 09/14/2017 SLP Diet Recommendations NPO;Ice chips PRN after oral care Liquid Administration via -- Medication Administration -- Compensations -- Postural Changes --   [facility] IP OTHER RECOMMENDATIONS 09/14/2017 Recommended Consults -- Oral Care Recommendations Oral care QID Other Recommendations --   [facility] IP FOLLOW UP RECOMMENDATIONS 09/12/2017 Follow up Recommendations Other (comment)   [facility] IP FREQUENCY AND DURATION 09/11/2017 Speech Therapy Frequency (ACUTE ONLY) min 3x week Treatment Duration 2 weeks      [facility]  IP ORAL PHASE 09/14/2017 Oral Phase WFL Oral - Pudding Teaspoon -- Oral - Pudding Cup --  Oral - Honey Teaspoon -- Oral - Honey Cup -- Oral - Nectar Teaspoon -- Oral - Nectar Cup -- Oral - Nectar Straw -- Oral - Thin Teaspoon -- Oral - Thin Cup -- Oral - Thin Straw -- Oral - Puree -- Oral - Mech Soft -- Oral - Regular -- Oral - Multi-Consistency -- Oral - Pill -- Oral Phase - Comment --  CH[facility] IP PHARYNGEAL PHASE 09/14/2017 Pharyngeal Phase Impaired Pharyngeal- Pudding Teaspoon -- Pharyngeal -- Pharyngeal- Pudding Cup -- Pharyngeal -- Pharyngeal- Honey Teaspoon -- Pharyngeal -- Pharyngeal- Honey Cup -- Pharyngeal -- Pharyngeal- Nectar Teaspoon Reduced pharyngeal peristalsis;Reduced epiglottic inversion;Penetration/Aspiration before swallow;Moderate aspiration;Pharyngeal residue - valleculae Pharyngeal Material enters airway, passes BELOW cords and not ejected out despite cough attempt by patient Pharyngeal- Nectar Cup -- Pharyngeal -- Pharyngeal- Nectar Straw -- Pharyngeal -- Pharyngeal- Thin Teaspoon Reduced pharyngeal peristalsis;Reduced epiglottic inversion;Penetration/Aspiration before swallow;Moderate aspiration;Pharyngeal residue - valleculae Pharyngeal Material enters airway, passes BELOW cords and not ejected out despite cough attempt by patient Pharyngeal- Thin Cup -- Pharyngeal -- Pharyngeal- Thin Straw -- Pharyngeal -- Pharyngeal- Puree -- Pharyngeal -- Pharyngeal- Mechanical Soft -- Pharyngeal -- Pharyngeal- Regular -- Pharyngeal -- Pharyngeal- Multi-consistency -- Pharyngeal -- Pharyngeal- Pill -- Pharyngeal -- Pharyngeal Comment --  No flowsheet data found. No flowsheet data found. Vincent Davis 09/14/2017, 1:14 PM              Recent Labs    09/14/17 0608 09/16/17 0410  WBC 19.1* 17.8*  HGB 11.9* 12.6*  HCT 38.8* 40.5  PLT 551* 516*   Recent Labs    09/14/17 0608 09/16/17 0410  NA 149* 139  K 3.7 3.7  CL 114* 103  GLUCOSE 141* 132*  BUN 23* 24*  CREATININE 1.23 1.00  CALCIUM 9.2 9.2   CBG (last 3)  Recent Labs    09/15/17 2356 09/16/17 0355 09/16/17 0813   GLUCAP 131* 127* 143*    Wt Readings from Last 3 Encounters:  09/16/17 99.9 kg (220 lb 3.8 oz)  09/14/17 100.2 kg (220 lb 14.4 oz)  08/28/17 107.5 kg (237 lb)    Physical Exam:  Constitutional: He appears well-developed.  HENT:  Head: Normocephalic.  Nasogastric tube in place.  Voice remains wet wet Eyes: EOM are normal.  Neck:  Cervical collar in place  Cardiovascular: RRR without murmur. No JVD  Respiratory: Occasional rhonchi, no distress.  Good air movement t  GI: Soft. Bowel sounds are normal. He exhibits no distension.  Skin  Warm and dry Neurological: He isalert.    Oriented to person place and age. Upper extremity motor grossly 3+-4 out of 5 proximal distal. He has decreased sensation in both arms and hands. Lower extremity strength is grossly 3-3+ out of 5 with hip flexion and knee extension and 4 out of 5 ankle dorsiflexion and plantar flexion with decreased pain and light touch in both legs--stable Psych: anxious per baseline    Assessment/Plan: 1.  Functional and mobility deficits secondary to cervical stenosis with myelopathy which require 3+ hours per day of interdisciplinary therapy in a comprehensive inpatient rehab setting. Physiatrist is providing close team supervision and 24 hour management of active medical problems listed below. Physiatrist and rehab team continue to assess barriers to discharge/monitor patient progress toward functional and medical goals.  Function:  Bathing Bathing position   Position: Shower  Bathing parts Body parts bathed by patient: Right arm,  Left arm, Chest, Abdomen, Front perineal area, Buttocks, Right upper leg, Left upper leg, Right lower leg, Left lower leg    Bathing assist Assist Level: Touching or steadying assistance(Pt > 75%)      Upper Body Dressing/Undressing Upper body dressing   What is the patient wearing?: Pull over shirt/dress     Pull over shirt/dress - Perfomed by patient: Thread/unthread right  sleeve, Thread/unthread left sleeve, Pull shirt over trunk Pull over shirt/dress - Perfomed by helper: Put head through opening        Upper body assist Assist Level: Touching or steadying assistance(Pt > 75%)      Lower Body Dressing/Undressing Lower body dressing   What is the patient wearing?: Socks, Shoes, Underwear, Pants Underwear - Performed by patient: Thread/unthread right underwear leg, Thread/unthread left underwear leg, Pull underwear up/down   Pants- Performed by patient: Thread/unthread right pants leg, Thread/unthread left pants leg, Pull pants up/down       Socks - Performed by patient: Don/doff right sock, Don/doff left sock   Shoes - Performed by patient: Don/doff right shoe, Don/doff left shoe, Fasten right Shoes - Performed by helper: Fasten left          Lower body assist Assist for lower body dressing: Touching or steadying assistance (Pt > 75%)      Toileting Toileting   Toileting steps completed by patient: Adjust clothing prior to toileting, Performs perineal hygiene, Adjust clothing after toileting   Toileting Assistive Devices: Grab bar or rail  Toileting assist Assist level: More than reasonable time   Transfers Chair/bed transfer   Chair/bed transfer method: Ambulatory Chair/bed transfer assist level: Touching or steadying assistance (Pt > 75%) Chair/bed transfer assistive device: Armrests     Locomotion Ambulation     Max distance: 250' Assist level: Touching or steadying assistance (Pt > 75%)   Wheelchair          Cognition Comprehension Comprehension assist level: Follows complex conversation/direction with extra time/assistive device  Expression Expression assist level: Expresses complex ideas: With extra time/assistive device  Social Interaction Social Interaction assist level: Interacts appropriately with others - No medications needed.  Problem Solving Problem solving assist level: Solves complex problems: Recognizes &  self-corrects  Memory Memory assist level: More than reasonable amount of time   Medical Problem List and Plan: 1.  Myelopathy with decreased functional mobility secondary to cervical stenosis status post decompression/ACDF C6-7 09/03/2017 with postoperative hematoma status post evacuation 09/07/2017 complicated by cardiopulmonary arrest with acute hypoxemia. Soft cervical collar at all times             -Continue therapies 2.  DVT Prophylaxis/Anticoagulation: SCDs. Check vascular study 3. Pain Management: Robaxin as needed.  Add tramadol for more severe pain 4. Mood: Provide emotional support 5. Neuropsych: This patient is capable of making decisions on his own behalf. 6. Skin/Wound Care: Routine skin checks 7. Fluids/Electrolytes/Nutrition:    -Increased tube feeds to 85 cc/h.  h20 thru tube    -Allow patient to have ice chips via spoon for pleasure   -Labs personally reviewed.  BUN is a little bit elevated but should respond to fluids via the feeding tube 8. Hypertension. Norvasc 5 mg daily, lisinopril 5 mg daily 9. Dysphagia. Follow-up speech therapy. Patient with nasogastric tube. Continue TF             -adv per SLP   -Given that he does have some swelling at the surgical site and is has been slow to progress, will  try a short course of  Prednisone via NGT to see if we can improve the edema 10. Aspiration pneumonia/leukocytosis currently on Zosyn and vancomycin. Latest blood cultures no growth   -White blood cell count down to 17,000   LOS (Days) 2 A FACE TO FACE EVALUATION WAS PERFORMED  Ranelle OysterSWARTZ,ZACHARY T, MD 09/16/2017 9:41 AM

## 2017-09-16 NOTE — Progress Notes (Signed)
VASCULAR LAB PRELIMINARY  PRELIMINARY  PRELIMINARY  PRELIMINARY  Bilateral lower extremity venous duplex completed.    Preliminary report:  There is no DVT or SVT noted in the bilateral lower extremities.  Incidentally, Patient pointed out bruised area on right upper arm.  I scanned the area and found superficial thrombosis throughout his right cephalic vein.    Hillarie Harrigan, RVT 09/16/2017, 12:11 PM

## 2017-09-17 ENCOUNTER — Inpatient Hospital Stay (HOSPITAL_COMMUNITY): Payer: No Typology Code available for payment source

## 2017-09-17 ENCOUNTER — Inpatient Hospital Stay (HOSPITAL_COMMUNITY): Payer: No Typology Code available for payment source | Admitting: Physical Therapy

## 2017-09-17 LAB — GLUCOSE, CAPILLARY
GLUCOSE-CAPILLARY: 138 mg/dL — AB (ref 65–99)
GLUCOSE-CAPILLARY: 148 mg/dL — AB (ref 65–99)
Glucose-Capillary: 109 mg/dL — ABNORMAL HIGH (ref 65–99)
Glucose-Capillary: 117 mg/dL — ABNORMAL HIGH (ref 65–99)
Glucose-Capillary: 84 mg/dL (ref 65–99)

## 2017-09-17 NOTE — Care Management (Signed)
[facility] Individual Statement of Services  Patient Name:  Vincent Davis  Date:  09/17/2017  Welcome to the [facility].  Our goal is to provide you with an individualized program based on your diagnosis and situation, designed to meet your specific needs.  With this comprehensive rehabilitation program, you will be expected to participate in at least 3 hours of rehabilitation therapies Monday-Friday, with modified therapy programming on the weekends.  Your rehabilitation program will include the following services:  Physical Therapy (PT), Occupational Therapy (OT), Speech Therapy (ST), 24 hour per day rehabilitation nursing, Therapeutic Recreaction (TR), Case Management (Social Worker), Rehabilitation Medicine, Nutrition Services and Pharmacy Services  Weekly team conferences will be held on Tuesdays to discuss your progress.  Your Social Worker will talk with you frequently to get your input and to update you on team discussions.  Team conferences with you and your family in attendance may also be held.  Expected length of stay: 5-7 days    Overall anticipated outcome: Modified independent  Depending on your progress and recovery, your program may change. Your Social Worker will coordinate services and will keep you informed of any changes. Your Social Worker's name and contact numbers are listed  below.  The following services may also be recommended but are not provided by the [facility]:   Driving Evaluations  Home Health Rehabiltiation Services  Outpatient Rehabilitation Services  Vocational Rehabilitation   Arrangements will be made to provide these services after discharge if needed.  Arrangements include referral to agencies that provide these services.  Your insurance has been verified to be:  None (medicaid application pending) Your primary doctor is:  Joaquin CourtsKimberly Harris  Pertinent information will be  shared with your doctor and your insurance company.  Social Worker:  [place] Renika [place], [place] [phone] or ([phone]) [phone]   Information discussed with and copy given to patient by: Amada JupiterHOYLE, Wassim Kirksey, 09/17/2017, 1:52 PM

## 2017-09-17 NOTE — Progress Notes (Signed)
[facility]     PROGRESS NOTE    Subjective/Complaints: Feeling much better today.  Has noticed improvement in his swallowing and voice since starting the prednisone yesterday.  Had a great night sleep and pain seems to be better controlled as well  ROS: pt denies nausea, vomiting, diarrhea, cough, shortness of breath or chest pain    Objective: Vital Signs: Blood pressure (!) 168/71, pulse 68, temperature 98 F (36.7 C), temperature source Oral, resp. rate 18, height 5\' 11"  (1.803 m), weight 99.5 kg (219 lb 5.7 oz), SpO2 98 %. No results found. Recent Labs    09/16/17 0410  WBC 17.8*  HGB 12.6*  HCT 40.5  PLT 516*   Recent Labs    09/16/17 0410  NA 139  K 3.7  CL 103  GLUCOSE 132*  BUN 24*  CREATININE 1.00  CALCIUM 9.2   CBG (last 3)  Recent Labs    09/16/17 2352 09/17/17 0351 09/17/17 0900  GLUCAP 160* 138* 109*    Wt Readings from Last 3 Encounters:  09/17/17 99.5 kg (219 lb 5.7 oz)  09/14/17 100.2 kg (220 lb 14.4 oz)  08/28/17 107.5 kg (237 lb)    Physical Exam:  Constitutional: He appears well-developed.  HENT:  Head: Normocephalic.  Nasogastric tube in place.  Voice much deeper and stronger today.  No gurgling appreciated Eyes: EOM are normal.  Neck:  Cervical collar in place  Cardiovascular: RRR without murmur. No JVD  Respiratory: Chest clear with normal air movements t  GI: Soft. Bowel sounds are normal. He exhibits no distension.  Skin  Warm and dry Neurological: He isalert.    Oriented to person place and age. Upper extremity motor grossly 3+-4 out of 5 proximal distal. He has decreased sensation in both arms and hands. Lower extremity strength is grossly 3-3+ out of 5 with hip flexion and knee extension and 4 out of 5 ankle dorsiflexion and plantar flexion with decreased pain and light touch in both legs--motor and sensory exam are normal Psych: Less anxious today    Assessment/Plan: 1.   Functional and mobility deficits secondary to cervical stenosis with myelopathy which require 3+ hours per day of interdisciplinary therapy in a comprehensive inpatient rehab setting. Physiatrist is providing close team supervision and 24 hour management of active medical problems listed below. Physiatrist and rehab team continue to assess barriers to discharge/monitor patient progress toward functional and medical goals.  Function:  Bathing Bathing position   Position: Shower  Bathing parts Body parts bathed by patient: Right arm, Left arm, Chest, Abdomen, Front perineal area, Buttocks, Right upper leg, Left upper leg, Right lower leg, Left lower leg    Bathing assist Assist Level: Touching or steadying assistance(Pt > 75%)      Upper Body Dressing/Undressing Upper body dressing   What is the patient wearing?: Pull over shirt/dress     Pull over shirt/dress - Perfomed by patient: Pull shirt over trunk, Thread/unthread left sleeve, Thread/unthread right sleeve Pull over shirt/dress - Perfomed by helper: Put head through opening        Upper body assist Assist Level: Touching or steadying assistance(Pt > 75%)      Lower Body Dressing/Undressing Lower body dressing   What is the patient wearing?: Socks, Shoes, Underwear, Pants Underwear - Performed by patient: Thread/unthread right underwear leg, Thread/unthread left underwear leg, Pull underwear up/down   Pants- Performed by patient: Thread/unthread right pants leg, Thread/unthread left pants leg, Pull pants up/down  Socks - Performed by patient: Don/doff right sock, Don/doff left sock   Shoes - Performed by patient: Don/doff right shoe, Don/doff left shoe, Fasten right Shoes - Performed by helper: Fasten left          Lower body assist Assist for lower body dressing: Touching or steadying assistance (Pt > 75%)      Toileting Toileting   Toileting steps completed by patient: Adjust clothing prior to toileting,  Performs perineal hygiene, Adjust clothing after toileting   Toileting Assistive Devices: Grab bar or rail  Toileting assist Assist level: More than reasonable time   Transfers Chair/bed transfer   Chair/bed transfer method: Ambulatory Chair/bed transfer assist level: Touching or steadying assistance (Pt > 75%) Chair/bed transfer assistive device: Armrests     Locomotion Ambulation     Max distance: 250' Assist level: Touching or steadying assistance (Pt > 75%)   Wheelchair          Cognition Comprehension Comprehension assist level: Follows complex conversation/direction with extra time/assistive device  Expression Expression assist level: Expresses complex ideas: With extra time/assistive device  Social Interaction Social Interaction assist level: Interacts appropriately with others - No medications needed.  Problem Solving Problem solving assist level: Solves complex problems: Recognizes & self-corrects  Memory Memory assist level: More than reasonable amount of time   Medical Problem List and Plan: 1.  Myelopathy with decreased functional mobility secondary to cervical stenosis status post decompression/ACDF C6-7 09/03/2017 with postoperative hematoma status post evacuation 09/07/2017 complicated by cardiopulmonary arrest with acute hypoxemia. Soft cervical collar at all times             -Continue PT, OT, speech therapy 2.  DVT Prophylaxis/Anticoagulation: SCDs.  Lower extremity Dopplers are negative.  The examiner did find a superficial thrombus in the right cephalic vein after the patient pointed out to her--- conservative management for that 3. Pain Management: Robaxin as needed.  Add tramadol for more severe pain 4. Mood: Provide emotional support 5. Neuropsych: This patient is capable of making decisions on his own behalf. 6. Skin/Wound Care: Routine skin checks 7. Fluids/Electrolytes/Nutrition:    -Increased tube feeds to 85 cc/h.  h20 thru tube    -Allowing patient  to have ice chips via spoon for pleasure   -Follow-up electrolytes later this week potentially 8. Hypertension. Norvasc 5 mg daily, lisinopril 5 mg daily 9. Dysphagia. NPO. Patient with nasogastric tube.   -Patient appears to have responded to prednisone.  Follow-up swallowing study per speech.  Perhaps this could be done tomorrow? 10. Aspiration pneumonia/leukocytosis currently on Zosyn and vancomycin. Latest blood cultures no growth   -White blood cell count down to 17,000 yesterday with essentially can rise again with the steroids   LOS (Days) 3 A FACE TO FACE EVALUATION WAS PERFORMED  Ranelle OysterSWARTZ,[place] T, MD 09/17/2017 9:06 AM

## 2017-09-17 NOTE — Progress Notes (Signed)
Social Work  Social Work Assessment and Plan  Patient Details  Name: Vincent Davis MRN: [id number] Date of Birth: 03/05/1976  Today's Date: 09/17/2017  Problem List:  Patient Active Problem List   Diagnosis Date Noted  . Cervical myelopathy (HCC) 09/14/2017  . Oropharyngeal dysphagia   . Acute renal failure (HCC) 09/07/2017  . Cervical spinal stenosis 09/03/2017  . Pulmonary edema, acute (HCC)   . Acute hypercapnic respiratory failure (HCC)   . Cardiac arrest due to respiratory disorder (HCC)   . 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 Cervical radiculopathy 06/25/2017  . Alcohol use 05/10/2017   Past Medical History:  Past Medical History:  Diagnosis Date  . Cervical spondylosis with radiculopathy   . Complication of anesthesia    no previous surgery  . Family history of adverse reaction to anesthesia    "I don't know."  . Obesity (BMI 30.0-34.9)   . Pneumonia    as a child   Past Surgical History:  Past Surgical History:  Procedure Laterality Date  . ANTERIOR CERVICAL DECOMP/DISCECTOMY FUSION N/A 09/03/2017   Procedure: CERVICAL SIX-SEVEN  ANTERIOR CERVICAL DISCECTOMY AND FUSION, ALLOGRAFT, PLATE;  Surgeon: Eldred MangesYates, Mark C, MD;  Location: [facility] OR;  Service: Orthopedics;  Laterality: N/A;  . EVACUATION OF CERVICAL HEMATOMA N/A 09/07/2017   Procedure: EVACUATION OF NECK HEMATOMA;  Surgeon: Eldred MangesYates, Mark C, MD;  Location: [facility] OR;  Service: Orthopedics;  Laterality: N/A;  . NO PAST SURGERIES     Social History:  reports that he has been smoking cigarettes.  he has never used smokeless tobacco. He reports that he drinks alcohol. He reports that he uses drugs. Drug: Marijuana.  Family / Support Systems Marital Status: Single Patient Roles: Partner Spouse/Significant Other: girlfriend, Lottie MusselMelissa Pittman @ (C) [phone] Anticipated Caregiver: girlfriend Ability/Limitations of Caregiver: works days Caregiver Availability: Evenings only Family  Dynamics: girlfriend very supportive  Social History Preferred language: English Religion: Non-Denominational Cultural Background: NA Read: Yes Write: Yes Employment Status: Unemployed Fish farm managerLegal Hisotry/Current Legal Issues: None Guardian/Conservator: None - per MD, pt is capable of making his own decisions   Abuse/Neglect Abuse/Neglect Assessment Can Be Completed: Yes Physical Abuse: Denies Verbal Abuse: Denies Sexual Abuse: Denies Exploitation of patient/patient's resources: Denies Self-Neglect: Denies  Emotional Status Pt's affect, behavior adn adjustment status: Very pleasant gentleman who denies any emotional distress.  Eager for d/c of NG tube and to go home.  anticipate very short LOS Pyschiatric History: None Substance Abuse History: None  Patient / Family Perceptions, Expectations & Goals Pt/Family understanding of illness & functional limitations: PT with good understanding of his surgery and resulting functional limitations/ need for CIR. Premorbid pt/family roles/activities: has needed some assist of girlfriend since injury in April Anticipated changes in roles/activities/participation: little change if able to reach mod ind goals. Pt/family expectations/goals: To get home ASAP  [employer] Community Agencies: None Premorbid Home Care/DME Agencies: None Transportation available at discharge: yes  Discharge Planning Living Arrangements: Spouse/significant other Support Systems: Spouse/significant other, Other relatives, Friends/neighbors Type of Residence: Private residence Insurance Resources: Customer service managerelf-pay Financial Screen Referred: Previously completed Living Expenses: Psychologist, sport and exerciseent Money Management: Patient Does the patient have any problems obtaining your medications?: Yes (Describe)(no insurance) Home Management: pt and girlfriend Patient/Family Preliminary Plans: Pt to return home with girlfriend Expected length of stay: 4-5 days  Clinical Impression Very  pleasant gentleman here following cervical surgery.  Eager to get NG out and to get home.  Making excellent progress and anticipate short LOS.  Denies any  s/s of emotional distress.  Will follow for d/c planning needs.  Witt Plitt 09/17/2017, 1:52 PM

## 2017-09-17 NOTE — Progress Notes (Signed)
Occupational Therapy Session Note  Patient Details  Name: Vincent DyerSamuel Keith Solis MRN: [id number] Date of Birth: 04/24/1976  Today's Date: 09/17/2017 OT Individual Time: 1100-1200 OT Individual Time Calculation (min): 60 min    Short Term Goals: Week 1:  OT Short Term Goal 1 (Week 1): LTG=STG 2/2 Elos  Skilled Therapeutic Interventions/Progress Updates:    Pt> resting in w/c upon arrival.  Pt declined bathing/dressing but agreeable to shower tomorrow morning.  Pt amb without AD to ADL apartment and engaged in simple home mgmt tasks.  Pt is NPO so did not engaged pt in meal prep tasks at this time.  Pt transitioned to [employer] gym and engaged in [employer] tasks with increasing difficulty-red only on compliant/noncompliant surface; red/green on compliant surface; compliant surface with single digit #s; compliant surface with 3 digit #s; compliant surface with 5 letter words.  Avg reaction time-.94 seconds to 2.3 seconds.  Pt did not require any steadying assist on compliant surface.  Pt returned to room and remained in w/c with all needs within reach.   Therapy Documentation Precautions:  Precautions Precautions: Fall, Cervical Precaution Comments: Cervical precautions reviewed Required Braces or Orthoses: Cervical Brace Cervical Brace: Soft collar, At all times Restrictions Weight Bearing Restrictions: No Pain: Pain Assessment Pain Assessment: 0-10 Pain Score: 8  Pain Type: Acute pain Pain Location: Neck Pain Orientation: Right;Left Pain Descriptors / Indicators: Aching Pain Frequency: Intermittent Pain Onset: Gradual Pain Intervention(s): premedicated  See Function Navigator for Current Functional Status.   Therapy/Group: Individual Therapy  Rich BraveLanier, Jaxson Keener Chappell 09/17/2017, 12:15 PM

## 2017-09-17 NOTE — Progress Notes (Signed)
Speech Language Pathology Daily Session Note  Patient Details  Name: Vincent Davis MRN: [id number] Date of Birth: 07/04/1976  Today's Date: 09/17/2017 SLP Individual Time: 1415-1450 SLP Individual Time Calculation (min): 35 min  Short Term Goals: Week 1: SLP Short Term Goal 1 (Week 1): STG=LTG due to ELOS   Skilled Therapeutic Interventions:  #1 Skilled ST services focused on swallow skills. SLP facilitated oral care piror to trials of ice chips, pt demonstrated Mod I ability to preform oral care and required supervision cues to clear throat with min wet vocal quality noted during ice chip trials and no other overt s/s aspiration noted. SLP ordered MBS for 12/18 due to observedd  improvement in swallow ability. Pt was left in room with visitors present. Recommend to continue skilled ST services.   #2 Skilled ST services focused on swallow skills. SLP facilitated oral care piror to trials of ice chips, pt demonstrated Mod I ability to preform oral care and Mod I to clear throat with slight wet vocal quality noted during ice chip trials and no other overt s/s aspiration noted. Skilled provided education about MBS, pt stated understanding and  all question were answered to pt satisfaction. Pt request to end therapy, stating " I am mentally exhausted." Pt was left in room with visitors present. Recommend to continue skilled ST services.       Function:  Eating Eating   Modified Consistency Diet: Yes(ice chips) Eating Assist Level: Supervision or verbal cues(clear throat)           Cognition Comprehension Comprehension assist level: Follows complex conversation/direction with extra time/assistive device  Expression   Expression assist level: Expresses complex ideas: With extra time/assistive device  Social Interaction Social Interaction assist level: Interacts appropriately with others - No medications needed.  Problem Solving Problem solving assist level: Solves complex problems:  Recognizes & self-corrects  Memory Memory assist level: More than reasonable amount of time    Pain Pain Assessment Pain Assessment: No/denies pain  Therapy/Group: Individual Therapy  Shamela Haydon  [facility]CR[facility] 09/17/2017, 3:08 PM

## 2017-09-17 NOTE — Progress Notes (Signed)
Physical Therapy Session Note  Patient Details  Name: Vincent Davis MRN: [id number] Date of Birth: 03/09/1976  Today's Date: 09/17/2017 PT Individual Time: 0800-0900 PT Individual Time Calculation (min): 60 min   Short Term Goals: Week 1:  PT Short Term Goal 1 (Week 1): =LTGs due to ELOS  Skilled Therapeutic Interventions/Progress Updates:    Pt seated in w/c in room, agreeable to participate in therapy session. Pt reports some neck pain this AM, pain is not rated and nursing is aware. Pt transfers with SBA to CGA. Ambulation x 150 ft, x 400 ft with SBA-CGA for balance, focus on increasing distance to improve endurance as pt fatigues quickly with standing activities. Standing balance activities: tandem ambulation with no UE support and CGA, agility ladder laterally and diagonally with CGA, karaoke with CGA, static stance on airex: Romberg, SLS, tandem with no UE support and CGA 2 x 30 sec each. Ascend/descend 12 stairs with 2 handrails, CGA, step-through gait pattern. Pt left seated in w/c in room with needs in reach.  Therapy Documentation Precautions:  Precautions Precautions: Fall, Cervical Precaution Comments: Cervical precautions reviewed Required Braces or Orthoses: Cervical Brace Cervical Brace: Soft collar, At all times Restrictions Weight Bearing Restrictions: No  See Function Navigator for Current Functional Status.   Therapy/Group: Individual Therapy  Peter Congoaylor Chaynce Schafer, PT, DPT  09/17/2017, 12:04 PM

## 2017-09-18 ENCOUNTER — Inpatient Hospital Stay (HOSPITAL_COMMUNITY): Payer: No Typology Code available for payment source

## 2017-09-18 ENCOUNTER — Telehealth: Payer: Self-pay

## 2017-09-18 ENCOUNTER — Inpatient Hospital Stay (HOSPITAL_COMMUNITY): Payer: No Typology Code available for payment source | Admitting: Physical Therapy

## 2017-09-18 ENCOUNTER — Encounter (HOSPITAL_COMMUNITY): Payer: No Typology Code available for payment source | Admitting: Speech Pathology

## 2017-09-18 ENCOUNTER — Inpatient Hospital Stay (HOSPITAL_COMMUNITY): Payer: Self-pay

## 2017-09-18 LAB — GLUCOSE, CAPILLARY
GLUCOSE-CAPILLARY: 101 mg/dL — AB (ref 65–99)
GLUCOSE-CAPILLARY: 177 mg/dL — AB (ref 65–99)
Glucose-Capillary: 101 mg/dL — ABNORMAL HIGH (ref 65–99)
Glucose-Capillary: 146 mg/dL — ABNORMAL HIGH (ref 65–99)
Glucose-Capillary: 174 mg/dL — ABNORMAL HIGH (ref 65–99)
Glucose-Capillary: 99 mg/dL (ref 65–99)
Glucose-Capillary: <10 mg/dL — CL (ref 65–99)

## 2017-09-18 LAB — CULTURE, BLOOD (ROUTINE X 2)
CULTURE: NO GROWTH
CULTURE: NO GROWTH
SPECIAL REQUESTS: ADEQUATE
Special Requests: ADEQUATE

## 2017-09-18 MED ORDER — DOCUSATE SODIUM 100 MG PO CAPS
100.0000 mg | ORAL_CAPSULE | Freq: Two times a day (BID) | ORAL | Status: DC
  Filled 2017-09-18 (×2): qty 1

## 2017-09-18 MED ORDER — MELATONIN 3 MG PO TABS
3.0000 mg | ORAL_TABLET | Freq: Every day | ORAL | Status: AC
  Administered 2017-09-19: 3 mg via GASTROSTOMY
  Filled 2017-09-18 (×2): qty 1

## 2017-09-18 MED ORDER — TRAMADOL HCL 50 MG PO TABS
50.0000 mg | ORAL_TABLET | Freq: Four times a day (QID) | ORAL | Status: DC | PRN
  Administered 2017-09-18: 50 mg via ORAL
  Filled 2017-09-18: qty 1

## 2017-09-18 MED ORDER — PREDNISONE 20 MG PO TABS: 20.0000 mg | ORAL_TABLET | Freq: Two times a day (BID) | ORAL | Status: DC

## 2017-09-18 MED ORDER — SENNA 8.6 MG PO TABS: 1.0000 | ORAL_TABLET | Freq: Every day | ORAL | Status: DC

## 2017-09-18 MED ORDER — ALPRAZOLAM 0.25 MG PO TABS
0.2500 mg | ORAL_TABLET | Freq: Three times a day (TID) | ORAL | Status: DC | PRN
  Administered 2017-09-18: 0.25 mg via ORAL
  Filled 2017-09-18: qty 1

## 2017-09-18 MED ORDER — PREDNISONE 5 MG PO TABS
10.0000 mg | ORAL_TABLET | Freq: Two times a day (BID) | ORAL | Status: DC
  Administered 2017-09-18 – 2017-09-19 (×2): 10 mg via ORAL
  Filled 2017-09-18 (×2): qty 2

## 2017-09-18 MED ORDER — ASPIRIN 81 MG PO CHEW
81.0000 mg | CHEWABLE_TABLET | Freq: Every day | ORAL | Status: DC
  Administered 2017-09-19: 81 mg via ORAL
  Filled 2017-09-18: qty 1

## 2017-09-18 MED ORDER — AMLODIPINE BESYLATE 5 MG PO TABS
5.0000 mg | ORAL_TABLET | Freq: Every day | ORAL | Status: DC
  Administered 2017-09-19: 5 mg via ORAL
  Filled 2017-09-18: qty 1

## 2017-09-18 MED ORDER — POLYETHYLENE GLYCOL 3350 17 G PO PACK
17.0000 g | PACK | Freq: Every day | ORAL | Status: DC
  Filled 2017-09-18: qty 1

## 2017-09-18 MED ORDER — LISINOPRIL 5 MG PO TABS
5.0000 mg | ORAL_TABLET | Freq: Every day | ORAL | Status: DC
  Administered 2017-09-19: 5 mg via ORAL
  Filled 2017-09-18: qty 1

## 2017-09-18 NOTE — Progress Notes (Signed)
Physical Therapy Session Note  Patient Details  Name: Vincent Davis MRN: [id number] Date of Birth: 12/24/1975  Today's Date: 09/18/2017 PT Individual Time: 1300-1345 PT Individual Time Calculation (min): 45 min   Short Term Goals: Week 1:  PT Short Term Goal 1 (Week 1): =LTGs due to ELOS  Skilled Therapeutic Interventions/Progress Updates: Pt received seated in w/c, denies pain and agreeable to treatment. Gait in unit >1000' with modI; occasional staggering with head turns and while talking with therapist d/t cognitive dual task, however recovers balance without assist or cues. Ascent/descent 24 steps 6" height with L handrail; one LOB when foot slipped off bottom step however did not require assist to recover balance. Performed nustep x8 min with BLE level 6 with average 50 steps/min for strengthening and aerobic endurance. Utilized [employer] opticianpublic bathroom modI. Bi[employer] catch game with stable surface progressed to unlocked unstable surface, BUE support progressed to no UE support; performed for focus on weight shifting, ankle/hip strategy. Pt declined participation in remainder of therapy session as his family was visiting. Returned to room modI ambulation. Remained seated in w/c at end of session, all needs in reach.      Therapy Documentation Precautions:  Precautions Precautions: Fall, Cervical Precaution Comments: Cervical precautions reviewed Required Braces or Orthoses: Cervical Brace Cervical Brace: Soft collar, At all times Restrictions Weight Bearing Restrictions: No General: PT Amount of Missed Time (min): 15 Minutes PT Missed Treatment Reason: Patient unwilling to participate Pain: Pain Assessment Pain Assessment: No/denies pain   See Function Navigator for Current Functional Status.   Therapy/Group: Individual Therapy  Vincent Davis 09/18/2017, 2:00 PM

## 2017-09-18 NOTE — Progress Notes (Signed)
Occupational Therapy Session Note  Patient Details  Name: Vincent Davis MRN: [id number] Date of Birth: 05/29/1976  Today's Date: 09/18/2017 OT Individual Time: 0700-0800 OT Individual Time Calculation (min): 60 min    Short Term Goals: Week 1:  OT Short Term Goal 1 (Week 1): LTG=STG 2/2 Elos  Skilled Therapeutic Interventions/Progress Updates:    Pt completed bathing/dressing tasks at mod I level.  Pt amb in room to gather clothing/supplies before walking into bathroom.  Pt completed bathing tasks with sit<>stand from chair.  Pt expressed interest in having a seat at home.  Pt requires more than a reasonable amount of time to complete tasks.  Pt amb from room to nursing station to get ice chips and returned to room.  Pt remained in w/c with all needs within reach.   Therapy Documentation Precautions:  Precautions Precautions: Fall, Cervical Precaution Comments: Cervical precautions reviewed Required Braces or Orthoses: Cervical Brace Cervical Brace: Soft collar, At all times Restrictions Weight Bearing Restrictions: No  Pain: Pain Assessment Pain Assessment: No/denies pain  See Function Navigator for Current Functional Status.   Therapy/Group: Individual Therapy  Rich BraveLanier, Teddy Pena Chappell 09/18/2017, 11:31 AM

## 2017-09-18 NOTE — Progress Notes (Signed)
[facility] PHYS[facility]ICINE & REHABILITATION     PROGRESS NOTE    Subjective/Complaints: Continue   to feel fairly well.  Anxious to have swallowing test today is that he can start on a diet.  Pain much better controlled overall  ROS: pt denies nausea, vomiting, diarrhea, cough, shortness of breath or chest pain    Objective: Vital Signs: Blood pressure (!) 141/74, pulse 85, temperature 97.6 F (36.4 C), temperature source Oral, resp. rate 18, height 5\' 11"  (1.803 m), weight 100.8 kg (222 lb 3.6 oz), SpO2 100 %. No results found. Recent Labs    09/16/17 0410  WBC 17.8*  HGB 12.6*  HCT 40.5  PLT 516*   Recent Labs    09/16/17 0410  NA 139  K 3.7  CL 103  GLUCOSE 132*  BUN 24*  CREATININE 1.00  CALCIUM 9.2   CBG (last 3)  Recent Labs    09/18/17 0007 09/18/17 0405 09/18/17 0803  GLUCAP 174* 146* 101*    Wt Readings from Last 3 Encounters:  09/18/17 100.8 kg (222 lb 3.6 oz)  09/14/17 100.2 kg (220 lb 14.4 oz)  08/28/17 107.5 kg (237 lb)    Physical Exam:  Constitutional: He appears well-developed.  HENT:  Head: Normocephalic.  Nasogastric tube in place.  Voice remains stronger, no wet voice sounds appreciated today Eyes: EOM are normal.  Neck:  Cervical collar in place  Cardiovascular: .rrri   Respiratory: Chest clear with normal air movements t  GI: Soft. Bowel sounds are normal. He exhibits no distension.  Skin  Warm and dry Neurological: He isalert.    Oriented to person place and age. Upper extremity motor grossly 3+-4 out of 5 proximal distal. He has decreased sensation in both arms and hands. Lower extremity strength is grossly 3-3+ out of 5 with hip flexion and knee extension and 4 out of 5 ankle dorsiflexion and plantar flexion with decreased pain and light touch in both legs--motor and sensory exam are stable  psych: Pleasant and appropriate    Assessment/Plan: 1.  Functional and mobility deficits secondary to cervical stenosis with  myelopathy which require 3+ hours per day of interdisciplinary therapy in a comprehensive inpatient rehab setting. Physiatrist is providing close team supervision and 24 hour management of active medical problems listed below. Physiatrist and rehab team continue to assess barriers to discharge/monitor patient progress toward functional and medical goals.  Function:  Bathing Bathing position   Position: Shower  Bathing parts Body parts bathed by patient: Right arm, Left arm, Chest, Abdomen, Front perineal area, Buttocks, Right upper leg, Left upper leg, Right lower leg, Left lower leg    Bathing assist Assist Level: Touching or steadying assistance(Pt > 75%)      Upper Body Dressing/Undressing Upper body dressing   What is the patient wearing?: Pull over shirt/dress     Pull over shirt/dress - Perfomed by patient: Pull shirt over trunk, Thread/unthread left sleeve, Thread/unthread right sleeve Pull over shirt/dress - Perfomed by helper: Put head through opening        Upper body assist Assist Level: Touching or steadying assistance(Pt > 75%)      Lower Body Dressing/Undressing Lower body dressing   What is the patient wearing?: Socks, Shoes, Underwear, Pants Underwear - Performed by patient: Thread/unthread right underwear leg, Thread/unthread left underwear leg, Pull underwear up/down   Pants- Performed by patient: Thread/unthread right pants leg, Thread/unthread left pants leg, Pull pants up/down       Socks - Performed  by patient: Don/doff right sock, Don/doff left sock   Shoes - Performed by patient: Don/doff right shoe, Don/doff left shoe, Fasten right Shoes - Performed by helper: Fasten left          Lower body assist Assist for lower body dressing: Touching or steadying assistance (Pt > 75%)      Toileting Toileting   Toileting steps completed by patient: Adjust clothing prior to toileting, Performs perineal hygiene, Adjust clothing after toileting Toileting  steps completed by helper: Adjust clothing prior to toileting, Adjust clothing after toileting(per Ivy Alan Ripperlaire [place], NT) Toileting Assistive Devices: Grab bar or rail  Toileting assist Assist level: Supervision or verbal cues   Transfers Chair/bed transfer   Chair/bed transfer method: Ambulatory Chair/bed transfer assist level: Touching or steadying assistance (Pt > 75%) Chair/bed transfer assistive device: Armrests     Locomotion Ambulation     Max distance: 400' Assist level: Touching or steadying assistance (Pt > 75%)   Wheelchair          Cognition Comprehension Comprehension assist level: Follows complex conversation/direction with extra time/assistive device  Expression Expression assist level: Expresses complex ideas: With extra time/assistive device  Social Interaction Social Interaction assist level: Interacts appropriately with others - No medications needed.  Problem Solving Problem solving assist level: Solves complex problems: Recognizes & self-corrects  Memory Memory assist level: More than reasonable amount of time   Medical Problem List and Plan: 1.  Myelopathy with decreased functional mobility secondary to cervical stenosis status post decompression/ACDF C6-7 09/03/2017 with postoperative hematoma status post evacuation 09/07/2017 complicated by cardiopulmonary arrest with acute hypoxemia. Soft cervical collar at all times             -Continue PT, OT, speech therapy.  Team conference today 2.  DVT Prophylaxis/Anticoagulation: SCDs.  Lower extremity Dopplers are negative.  The examiner did find a superficial thrombus in the right cephalic vein after the patient pointed out to her--- conservative management for that 3. Pain Management: Robaxin as needed.  Add tramadol for more severe pain 4. Mood: Provide emotional support 5. Neuropsych: This patient is capable of making decisions on his own behalf. 6. Skin/Wound Care: Routine skin checks 7.  Fluids/Electrolytes/Nutrition:    -I continue tube feeds and water through tube for now    -Allowing patient to have ice chips via spoon for pleasure   -Follow-up electrolytes later this week potentially 8. Hypertension. Norvasc 5 mg daily, lisinopril 5 mg daily 9. Dysphagia. NPO. Patient with nasogastric tube.   -Modified barium swallow today.    -Continue prednisone twice daily for perioperative edema 10. Aspiration pneumonia/leukocytosis currently on Zosyn and vancomycin. Latest blood cultures no growth   -White blood cell count down to 17,000 yesterday with essentially can rise again with the steroids   LOS (Days) 4 A FACE TO FACE EVALUATION WAS PERFORMED  Ranelle OysterSWARTZ,Jaclynn Laumann T, MD 09/18/2017 9:14 AM

## 2017-09-18 NOTE — Discharge Summary (Signed)
Discharge summary job [id number]

## 2017-09-18 NOTE — Progress Notes (Signed)
Modified Barium Swallow Progress Note  Patient Details  Name: Vincent Davis MRN: [id number] Date of Birth: 04/22/1976  Today's Date: 09/18/2017  Modified Barium Swallow completed.  Full report located under Chart Review in the Imaging Section.  Brief recommendations include the following:  Clinical Impression      Pt presents with significantly improved function in comparison to 12/14 MBS.  Pt has mild edema impacting pharyngeal stripping and clearance of boluses through CP segment.  These deficits are mild and did not impact pt's ability to protect his airway as evidenced by no aspiration or penetration across any consistencies, even when challenged with multiple consecutive boluses of thins via straw and mixed consistencies.  Pt did have stasis of barium table in both the vallecula and at CP segment which required multiple pureed boluses to pass.  Pt also had mild residue in the vallecula>pyriforms which cleared with supervision cues for multiple swallows.  As a result, recommend advancing pt to regular textures, thin liquids; meds whole in puree with intermittent supervision for use of swallowing precautions (multiple swallows, slow rate, small bites/sips).   Reviewed recommendations with pt both immediately following study and once returned to pt's room.  Also discussed findings of study with pt's RN and PA.  Plan to remove Cortrack.        Swallow Evaluation Recommendations       SLP Diet Recommendations: Regular solids;Thin liquid   Liquid Administration via: Cup;Straw   Medication Administration: Whole meds with puree   Supervision: Patient able to self feed;Intermittent supervision to cue for compensatory strategies   Compensations: Multiple dry swallows after each bite/sip;Small sips/bites;Slow rate                Vincent Davis, Vincent Davis 09/18/2017,12:19 PM

## 2017-09-18 NOTE — Progress Notes (Signed)
Speech Language Pathology Daily Session Note  Patient Details  Name: Vincent Davis MRN: [id number] Date of Birth: 11/18/1975  Today's Date: 09/18/2017 SLP Individual Time: 1115-1150 SLP Individual Time Calculation (min): 35 min  Short Term Goals: Week 1: SLP Short Term Goal 1 (Week 1): STG=LTG due to ELOS   Skilled Therapeutic Interventions: Skilled ST services focused on swallow skills. SLP facilitated skilled observation of upgraded diet, regular and thin, pt demonstrated ability to recall swallow strategies and demonstrate swallow strategies, multiple swallows every few bites, small bites/sips, slow rate, at Mod I level with no overt s/s aspiration. Pt demonstrated ability to verbalize reason for multiple swallows with supervision cues.SLP communicated with nurse about PO consumption performance. Pt was left in room with call bell within reach. Recommend to continue skilled ST services and plan for discharge.     Function:  Eating Eating Eating activity did not occur: Safety/medical concerns Modified Consistency Diet: No Eating Assist Level: No help, No cues           Cognition Comprehension Comprehension assist level: Follows complex conversation/direction with extra time/assistive device  Expression   Expression assist level: Expresses complex ideas: With extra time/assistive device  Social Interaction Social Interaction assist level: Interacts appropriately with others - No medications needed.  Problem Solving Problem solving assist level: Solves complex problems: Recognizes & self-corrects  Memory Memory assist level: More than reasonable amount of time    Pain Pain Assessment Pain Assessment: No/denies pain  Therapy/Group: Individual Therapy  Vincent Davis  [facility] 09/18/2017, 11:56 AM

## 2017-09-18 NOTE — Progress Notes (Signed)
Occupational Therapy Discharge Summary  Patient Details  Name: Vincent Davis MRN: [id number] Date of Birth: 1976/09/04  Patient has met 8 of 8 long term goals due to improved activity tolerance, improved balance, postural control, ability to compensate for deficits and improved awareness.  Pt made excellent progress with BADLs and is discharging home at mod I level.  Pt amb without AD for all simple home mgmt tasks and accessing bathroom.  Caregivers/family has not been present for therapy. Patient to discharge at overall Modified Independent level.  Patient's care partner is independent to provide the necessary physical assistance at discharge for higher level iADL.      Recommendation:  No follow up OT services needed  Equipment: shower seat  Reasons for discharge: treatment goals met and discharge from hospital  Patient/family agrees with progress made and goals achieved: Yes  OT Discharge Vision Baseline Vision/History: No visual deficits Wears Glasses: At all times Vision Assessment?: No apparent visual deficits Perception  Perception: Within Functional Limits Praxis Praxis: Intact Cognition Overall Cognitive Status: Within Functional Limits for tasks assessed Arousal/Alertness: Awake/alert Orientation Level: Oriented X4 Attention: Selective Selective Attention: Appears intact Memory: Appears intact Awareness: Appears intact Problem Solving: Appears intact Sensation Sensation Light Touch: Impaired by gross assessment Light Touch Impaired Details: Impaired RUE;Impaired LUE Stereognosis: Impaired by gross assessment Proprioception: Appears Intact Coordination Gross Motor Movements are Fluid and Coordinated: Yes Fine Motor Movements are Fluid and Coordinated: Yes    Trunk/Postural Assessment  Cervical Assessment Cervical Assessment: Exceptions to WFL(soft collar) Thoracic Assessment Thoracic Assessment: Within Functional Limits Lumbar Assessment Lumbar  Assessment: Within Functional Limits Postural Control Postural Control: Within Functional Limits    Extremity/Trunk Assessment RUE Assessment RUE Assessment: Exceptions to [facility] RUE Strength RUE Overall Strength: Deficits RUE Overall Strength Comments: 3+/5 overall; AROM limited 2/2 cervical precautions LUE Assessment LUE Assessment: Exceptions to [facility] LUE Strength LUE Overall Strength Comments: 3+/5 overall-limited ROM 2/2 cervical precautions   See Function Navigator for Current Functional Status.  Leotis Shames [facility] 09/18/2017, 3:09 PM

## 2017-09-18 NOTE — Telephone Encounter (Signed)
Patient called and wanted to know if he should follow up after his hospital visit. Patient was advise the he is schedule for 09/28/2017 and that he would discuss the hospital follow up at that time.

## 2017-09-18 NOTE — Discharge Instructions (Signed)
Inpatient Rehab Discharge Instructions  Hinton DyerSamuel Keith Hertzberg Discharge date and time: No discharge date for patient encounter.   Activities/Precautions/ Functional Status: Activity: activity as tolerated Diet: Regular diet Wound Care: keep wound clean and dry Functional status:  ___ No restrictions     ___ Walk up steps independently ___ 24/7 supervision/assistance   ___ Walk up steps with assistance ___ Intermittent supervision/assistance  ___ Bathe/dress independently ___ Walk with walker     __x_ Bathe/dress with assistance ___ Walk Independently    ___ Shower independently ___ Walk with assistance    ___ Shower with assistance ___ No alcohol     ___ Return to work/school ________  Special Instructions: Soft cervical collar at all times   My questions have been answered and I understand these instructions. I will adhere to these goals and the provided educational materials after my discharge from the hospital.  Patient/Caregiver Signature _______________________________ Date __________  Clinician Signature _______________________________________ Date __________  Please bring this form and your medication list with you to all your follow-up doctor's appointments.

## 2017-09-19 ENCOUNTER — Inpatient Hospital Stay (HOSPITAL_COMMUNITY): Payer: No Typology Code available for payment source

## 2017-09-19 ENCOUNTER — Inpatient Hospital Stay (HOSPITAL_COMMUNITY): Payer: No Typology Code available for payment source | Admitting: Occupational Therapy

## 2017-09-19 LAB — GLUCOSE, CAPILLARY: Glucose-Capillary: 103 mg/dL — ABNORMAL HIGH (ref 65–99)

## 2017-09-19 MED ORDER — PREDNISONE 5 MG PO TABS: 10.0000 mg | ORAL_TABLET | Freq: Every day | ORAL | Status: DC

## 2017-09-19 MED ORDER — AMLODIPINE BESYLATE 5 MG PO TABS: 5.0000 mg | ORAL_TABLET | Freq: Every day | ORAL | 0 refills | Status: DC

## 2017-09-19 MED ORDER — METHOCARBAMOL 500 MG PO TABS: 500.0000 mg | ORAL_TABLET | Freq: Four times a day (QID) | ORAL | 0 refills | Status: DC | PRN

## 2017-09-19 MED ORDER — PREDNISONE 10 MG PO TABS: 10.0000 mg | ORAL_TABLET | Freq: Two times a day (BID) | ORAL | 0 refills | Status: DC

## 2017-09-19 MED ORDER — PREDNISONE 10 MG PO TABS: 10.0000 mg | ORAL_TABLET | Freq: Every day | ORAL | 0 refills | Status: DC

## 2017-09-19 MED ORDER — LISINOPRIL 5 MG PO TABS: 5.0000 mg | ORAL_TABLET | Freq: Every day | ORAL | 0 refills | Status: DC

## 2017-09-19 MED ORDER — TRAMADOL HCL 50 MG PO TABS: 50.0000 mg | ORAL_TABLET | Freq: Four times a day (QID) | ORAL | 0 refills | Status: DC | PRN

## 2017-09-19 MED FILL — AMLODIPINE BESYLATE 5 MG TA: 5 | 30 days supply | Qty: 30 | Fill #0

## 2017-09-19 MED FILL — ?PREDNISONE 10 MG TABLET: 10 | 3 days supply | Qty: 3 | Fill #0

## 2017-09-19 MED FILL — METHOCARBAMOL 500 MG TABS: 500 | 15 days supply | Qty: 60 | Fill #0

## 2017-09-19 MED FILL — traMADol HCL 50 MG TABS: 50 | 5 days supply | Qty: 20 | Fill #0

## 2017-09-19 MED FILL — ?LISINOPRIL 5 MG TABLET: 5 | 30 days supply | Qty: 30 | Fill #0

## 2017-09-19 NOTE — Progress Notes (Signed)
Physical Therapy Discharge Summary  Patient Details  Name: Vincent Davis MRN: [id number] Date of Birth: Jun 03, 1976  Today's Date: 09/19/2017   Patient has met 5 of 5 long term goals due to improved activity tolerance, improved balance, improved postural control, increased strength, ability to compensate for deficits and improved coordination.  Patient to discharge at an ambulatory level Modified Independent.   Patient does not require physical assistance from a caregiver at d/c.   Reasons goals not met: All goals met  Recommendation:  Patient will benefit from ongoing skilled PT services in outpatient setting to continue to advance safe functional mobility, address ongoing impairments in dynamic balance, activity tolerance, and minimize fall risk.  Equipment: No equipment provided  Reasons for discharge: treatment goals met  Patient/family agrees with progress made and goals achieved: Yes  PT Discharge Precautions/Restrictions Precautions Precautions: Fall;Cervical Precaution Comments: Cervical precautions reviewed Required Braces or Orthoses: Cervical Brace Cervical Brace: Soft collar;At all times Restrictions Weight Bearing Restrictions: No Vision/Perception  Perception Perception: Within Functional Limits Praxis Praxis: Intact  Cognition Overall Cognitive Status: Within Functional Limits for tasks assessed Arousal/Alertness: Awake/alert Orientation Level: Oriented X4 Awareness: Appears intact Problem Solving: Appears intact Safety/Judgment: Appears intact Sensation Sensation Light Touch: Impaired by gross assessment Light Touch Impaired Details: Impaired RUE;Impaired LUE Stereognosis: Impaired by gross assessment Proprioception: Appears Intact Coordination Gross Motor Movements are Fluid and Coordinated: Yes Fine Motor Movements are Fluid and Coordinated: Yes Heel Shin Test: [facility] Motor  Motor Motor: Within Functional Limits  Mobility Bed Mobility Bed  Mobility: Rolling Right;Rolling Left;Supine to Sit;Sit to Supine Rolling Right: 7: Independent Rolling Left: 7: Independent Supine to Sit: 7: Independent Sit to Supine: 7: Independent Transfers Transfers: Yes Sit to Stand: 6: Modified independent (Device/Increase time) Stand to Sit: 6: Modified independent (Device/Increase time) Stand Pivot Transfers: 6: Modified independent (Device/Increase time) Locomotion  Ambulation Ambulation: Yes Ambulation/Gait Assistance: 6: Modified independent (Device/Increase time) Ambulation Distance (Feet): 300 Feet Assistive device: None Gait Gait: Yes Gait Pattern: Impaired Gait Pattern: (occasional mild staggering when attempting to look at surroundings) Gait velocity: decreased for age/gender norms Stairs / Additional Locomotion Stairs: Yes Stairs Assistance: 6: Modified independent (Device/Increase time) Stair Management Technique: One rail Left;Alternating pattern;Forwards Number of Stairs: 24 Height of Stairs: 6 Ramp: 6: Modified independent (Device) Curb: 6: Modified independent (Device/increase time) Wheelchair Mobility Wheelchair Mobility: No  Trunk/Postural Assessment  Cervical Assessment Cervical Assessment: Exceptions to WFL(soft collar) Thoracic Assessment Thoracic Assessment: Within Functional Limits Lumbar Assessment Lumbar Assessment: Within Functional Limits Postural Control Postural Control: Within Functional Limits  Balance Balance Balance Assessed: Yes Dynamic Sitting Balance Dynamic Sitting - Balance Support: During functional activity Dynamic Sitting - Level of Assistance: 6: Modified independent (Device/Increase time) Static Standing Balance Static Standing - Balance Support: During functional activity Static Standing - Level of Assistance: 6: Modified independent (Device/Increase time) Dynamic Standing Balance Dynamic Standing - Balance Support: During functional activity;No upper extremity supported Dynamic  Standing - Level of Assistance: 6: Modified independent (Device/Increase time) Extremity Assessment  RUE Strength RUE Overall Strength Comments: 3+/5 overall; AROM limited 2/2 cervical precautions LUE Assessment LUE Assessment: Exceptions to [facility] LUE Strength LUE Overall Strength Comments: 3+/5 overall-limited ROM 2/2 cervical precautions RLE Assessment RLE Assessment: Within Functional Limits LLE Assessment LLE Assessment: Within Functional Limits   See Function Navigator for Current Functional Status.  Benjiman Core Tygielski 09/19/2017, 4:16 PM

## 2017-09-19 NOTE — Progress Notes (Signed)
Speech Language Pathology Discharge Summary  Patient Details  Name: Vincent Davis MRN: [id number] Date of Birth: 08/06/1976  Today's Date: 09/19/2017 SLP Individual Time:  -      Skilled Therapeutic Interventions:  N/A    Patient has met 3 of 3 long term goals.  Patient to discharge at overall Modified Independent level.  Reasons goals not met:     Clinical Impression/Discharge Summary:   Pt demonstrated ability to met 3 out 3 swallow skills goals and is discharging at Mod I level. Pt was upgraded to regular textured foods and thin liquids and demonstrates ability to follow swallow strategies with intermittent multiple swallows to clear residue. Pt no longer requires skilled ST services due to least restrictive diet and Mod I level in swallow ability.   Care Partner:  Caregiver Able to Provide Assistance: Yes  Type of Caregiver Assistance: Cognitive  Recommendation:  None      Equipment:     Reasons for discharge: Discharged from hospital   Patient/Family Agrees with Progress Made and Goals Achieved: Yes   Function:  Eating Eating                 Cognition Comprehension    Expression      Social Interaction    Problem Solving    Memory        09/19/2017, 4:18 PM    

## 2017-09-19 NOTE — Progress Notes (Signed)
Pt A/O, no noted distress. Denies pain. Assessed skin,  Steri-strips remain in place no noted drainage with foam dressing over. Educated pt on diressing change. Staff will continue to monitor, meet needs, and monitor safety. He ambulates in room safely.

## 2017-09-19 NOTE — Progress Notes (Signed)
[facility]     PROGRESS NOTE    Subjective/Complaints: Patient sitting in bed.  Appreciative of the team and their efforts.  Excited to be going home.  He had questions about his medications.s  ROS: pt denies nausea, vomiting, diarrhea, cough, shortness of breath or chest pain   Objective: Vital Signs: Blood pressure 133/68, pulse (!) 101, temperature 98.7 F (37.1 C), temperature source Oral, resp. rate 18, height 5\' 11"  (1.803 m), weight 101.3 kg (223 lb 5.2 oz), SpO2 100 %. Dg Swallowing Func-speech Pathology  Result Date: 09/18/2017 Objective Swallowing Evaluation: Type of Study: MBS-Modified Barium Swallow Study  Patient Details Name: Vincent Davis MRN: [id number] Date of Birth: 1975/11/15 Today's Date: 09/18/2017 Past Medical History: Past Medical History: Diagnosis Date . Cervical spondylosis with radiculopathy  . Complication of anesthesia   no previous surgery . Family history of adverse reaction to anesthesia   "I don't know." . Obesity (BMI 30.0-34.9)  . Pneumonia   as a child Past Surgical History: Past Surgical History: Procedure Laterality Date . ANTERIOR CERVICAL DECOMP/DISCECTOMY FUSION N/A 09/03/2017  Procedure: CERVICAL SIX-SEVEN  ANTERIOR CERVICAL DISCECTOMY AND FUSION, ALLOGRAFT, PLATE;  Surgeon: Eldred Manges, MD;  Location: [facility] OR;  Service: Orthopedics;  Laterality: N/A; . EVACUATION OF CERVICAL HEMATOMA N/A 09/07/2017  Procedure: EVACUATION OF NECK HEMATOMA;  Surgeon: Eldred Manges, MD;  Location: [facility] OR;  Service: Orthopedics;  Laterality: N/A; . NO PAST SURGERIES   HPI: Pt is a 41 year old male who presented for elective ACDF 12/3. In post-op setting he developed respiratory distress with subsequent cardiac arrest, 20 mins PEA. He had a great deal of soft tissue swelling and hematoma on a CT scan of the neck. He underwent exploration with drainage of the hematoma on 12/ 7. He was intubated 12/3-12/9 but has had difficulty managing  secretions post-extubation.  NPO with Cortrack per MBS on 09/14/2017.  Repeat MBS ordered today to objectively determine readiness to advance  Subjective: alert Assessment / Plan / Recommendation CHL IP CLINICAL IMPRESSIONS 09/18/2017 Clinical Impression Pt presents with significantly improved function in comparison to 12/14 MBS.  Pt has mild edema impacting pharyngeal stripping and clearance of boluses through CP segment.  These deficits are mild and did not impact pt's ability to protect his airway as evidenced by no aspiration or penetration across any consistencies, even when challenged with multiple consecutive boluses of thins via straw and mixed consistencies.  Pt did have stasis of barium table in both the vallecula and at CP segment which required multiple pureed boluses to pass.  Pt also had mild residue in the vallecula>pyriforms which cleared with supervision cues for multiple swallows.  As a result, recommend advancing pt to regular textures, thin liquids; meds whole in puree with intermittent supervision for use of swallowing precautions (multiple swallows, slow rate, small bites/sips).   Reviewed recommendations with pt both immediately following study and once returned to pt's room.  Also discussed findings of study with pt's RN and PA.  Plan to remove Cortrack.   SLP Visit Diagnosis Dysphagia, pharyngeal phase (R13.13)     Impact on safety and function Mild aspiration risk     Prognosis 09/18/2017 Prognosis for Safe Diet Advancement Good Barriers to Reach Goals -- Barriers/Prognosis Comment -- CHL IP DIET RECOMMENDATION 09/18/2017 SLP Diet Recommendations Regular solids;Thin liquid Liquid Administration via Cup;Straw Medication Administration Whole meds with puree Compensations Multiple dry swallows after each bite/sip;Small sips/bites;Slow rate Postural Changes --  CHL IP ORAL PHASE 09/18/2017 Oral Phase WFL Oral - Pudding Teaspoon -- Oral - Pudding Cup -- Oral - Honey Teaspoon -- Oral -  Honey Cup -- Oral - Nectar Teaspoon -- Oral - Nectar Cup -- Oral - Nectar Straw -- Oral - Thin Teaspoon -- Oral - Thin Cup -- Oral - Thin Straw -- Oral - Puree -- Oral - Mech Soft -- Oral - Regular -- Oral - Multi-Consistency -- Oral - Pill -- Oral Phase - Comment --  CHL IP PHARYNGEAL PHASE 09/18/2017 Pharyngeal Phase Impaired Pharyngeal- Pudding Teaspoon -- Pharyngeal -- Pharyngeal- Pudding Cup -- Pharyngeal -- Pharyngeal- Honey Teaspoon -- Pharyngeal -- Pharyngeal- Honey Cup -- Pharyngeal -- Pharyngeal- Nectar Teaspoon -- Pharyngeal -- Pharyngeal- Nectar Cup -- Pharyngeal -- Pharyngeal- Nectar Straw -- Pharyngeal -- Pharyngeal- Thin Teaspoon Delayed swallow initiation-pyriform sinuses;Reduced pharyngeal peristalsis;Pharyngeal residue - valleculae Pharyngeal -- Pharyngeal- Thin Cup Delayed swallow initiation-pyriform sinuses;Reduced pharyngeal peristalsis;Pharyngeal residue - valleculae Pharyngeal -- Pharyngeal- Thin Straw Delayed swallow initiation-pyriform sinuses;Reduced pharyngeal peristalsis;Pharyngeal residue - valleculae Pharyngeal -- Pharyngeal- Puree -- Pharyngeal -- Pharyngeal- Mechanical Soft -- Pharyngeal -- Pharyngeal- Regular Delayed swallow initiation-vallecula Pharyngeal -- Pharyngeal- Multi-consistency -- Pharyngeal -- Pharyngeal- Pill -- Pharyngeal -- Pharyngeal Comment --  CHL IP CERVICAL ESOPHAGEAL PHASE 09/18/2017 Cervical Esophageal Phase Impaired Pudding Teaspoon -- Pudding Cup -- Honey Teaspoon -- Honey Cup -- Nectar Teaspoon -- Nectar Cup -- Nectar Straw -- Thin Teaspoon -- Thin Cup -- Thin Straw -- Puree -- Mechanical Soft -- Regular -- Multi-consistency -- Pill -- Cervical Esophageal Comment reduced CP relaxation due to edema No flowsheet data found. Page, Melanee SpryNicole L 09/18/2017, 10:28 AM              No results for input(s): WBC, HGB, HCT, PLT in the last 72 hours. No results for input(s): NA, K, CL, GLUCOSE, BUN, CREATININE, CALCIUM in the last 72 hours.  Invalid input(s): CO CBG  (last 3)  Recent Labs    09/18/17 1640 09/18/17 2106 09/19/17 0628  GLUCAP 99 101* 103*    Wt Readings from Last 3 Encounters:  09/19/17 101.3 kg (223 lb 5.2 oz)  09/14/17 100.2 kg (220 lb 14.4 oz)  08/28/17 107.5 kg (237 lb)    Physical Exam:  Constitutional: He appears well-developed.  HENT:  Head: Normocephalic.  Nasogastric tube in place.  Voice remains stronger, no wet voice sounds appreciated today Eyes: EOM are normal.  Neck:  Cervical collar in place  Cardiovascular: .Slight tachycardia Respiratory: CTA Bilaterally without wheezes or rales. Normal effort  GI: Soft. Bowel sounds are normal. He exhibits no distension.  Skin  Warm and dry Neurological: He isalert.    Oriented to person place and age. Upper extremity motor grossly 4 out of 5 proximal distal. He has decreased sensation in both arms and hands. Lower extremity strength is grossly 4- out of 5 with hip flexion and knee extension and 4 out of 5 ankle dorsiflexion and plantar flexion with decreased pain and light touch in both legs--  psych: Pleasant and appropriate    Assessment/Plan: 1.  Functional and mobility deficits secondary to cervical stenosis with myelopathy which require 3+ hours per day of interdisciplinary therapy in a comprehensive inpatient rehab setting. Physiatrist is providing close team supervision and 24 hour management of active medical problems listed below. Physiatrist and rehab team continue to assess barriers to discharge/monitor patient progress toward functional and medical goals.  Function:  Bathing Bathing position   Position: Shower  Bathing parts Body parts bathed by  patient: Right arm, Left arm, Chest, Abdomen, Front perineal area, Buttocks, Right upper leg, Left upper leg, Right lower leg, Left lower leg    Bathing assist Assist Level: More than reasonable time      Upper Body Dressing/Undressing Upper body dressing   What is the patient wearing?: Pull over  shirt/dress     Pull over shirt/dress - Perfomed by patient: Pull shirt over trunk, Thread/unthread left sleeve, Thread/unthread right sleeve, Put head through opening Pull over shirt/dress - Perfomed by helper: Put head through opening        Upper body assist Assist Level: More than reasonable time      Lower Body Dressing/Undressing Lower body dressing   What is the patient wearing?: Socks, Shoes, Underwear, Pants Underwear - Performed by patient: Thread/unthread right underwear leg, Thread/unthread left underwear leg, Pull underwear up/down   Pants- Performed by patient: Thread/unthread right pants leg, Thread/unthread left pants leg, Pull pants up/down       Socks - Performed by patient: Don/doff right sock, Don/doff left sock   Shoes - Performed by patient: Don/doff right shoe, Don/doff left shoe, Fasten right, Fasten left Shoes - Performed by helper: Fasten left          Lower body assist Assist for lower body dressing: More than reasonable time      Toileting Toileting   Toileting steps completed by patient: Adjust clothing prior to toileting, Performs perineal hygiene, Adjust clothing after toileting Toileting steps completed by helper: Adjust clothing prior to toileting, Adjust clothing after toileting(per Ivy Alan Ripperlaire Aquit, NT) Toileting Assistive Devices: Grab bar or rail  Toileting assist Assist level: No help/no cues   Transfers Chair/bed transfer   Chair/bed transfer method: Ambulatory Chair/bed transfer assist level: No Help, no cues, assistive device, takes more than a reasonable amount of time Chair/bed transfer assistive device: Armrests     Locomotion Ambulation     Max distance: 1000 Assist level: No help, No cues, assistive device, takes more than a reasonable amount of time   Wheelchair          Cognition Comprehension Comprehension assist level: Follows complex conversation/direction with extra time/assistive device  Expression  Expression assist level: Expresses complex ideas: With no assist  Social Interaction Social Interaction assist level: Interacts appropriately with others - No medications needed.  Problem Solving Problem solving assist level: Solves complex problems: Recognizes & self-corrects  Memory Memory assist level: Complete Independence: No helper   Medical Problem List and Plan: 1.  Myelopathy with decreased functional mobility secondary to cervical stenosis status post decompression/ACDF C6-7 09/03/2017 with postoperative hematoma status post evacuation 09/07/2017 complicated by cardiopulmonary arrest with acute hypoxemia. Soft cervical collar at all times             -Continue PT, OT, speech therapy.  Discharge home today.  Follow-up with me in about 3-4 weeks 2.  DVT Prophylaxis/Anticoagulation: SCDs.  Lower extremity Dopplers are negative.  The examiner did find a superficial thrombus in the right cephalic vein after the patient pointed out to her--- conservative management for that 3. Pain Management: Robaxin as needed.  Add tramadol for more severe pain 4. Mood: Provide emotional support 5. Neuropsych: This patient is capable of making decisions on his own behalf. 6. Skin/Wound Care: Routine skin checks 7. Fluids/Electrolytes/Nutrition:    -I continue tube feeds and water through tube for now    -Allowing patient to have ice chips via spoon for pleasure   -Follow-up electrolytes later this week potentially  8. Hypertension. Norvasc 5 mg daily, lisinopril 5 mg daily--- good control at present 9. Dysphagia.  Advance to regular diet yesterday after modified barium swallow    -Taper prednisone to off over the next 3 days 10. Aspiration pneumonia/leukocytosis: Antibiotics completed  11.  Leukocytosis secondary to steroids   LOS (Days) 5 A FACE TO FACE EVALUATION WAS PERFORMED  Ranelle Oyster, MD 09/19/2017 8:56 AM

## 2017-09-19 NOTE — Patient Care Conference (Signed)
Inpatient RehabilitationTeam Conference and Plan of Care Update Date: 09/18/2017   Time: 2:15 PM    Patient Name: Vincent Davis      Medical Record Number: [id number]  Date of Birth: 03/18/1976 Sex: Male         Room/Bed: 4W23C/4W23C-01 Payor Info: Payor: MEDICAID PENDING / Plan: MEDICAID PENDING / Product Type: *No Product type* /    Admitting Diagnosis: ACDF  Admit Date/Time:  09/14/2017  5:01 PM Admission Comments: No comment available   Primary Diagnosis:  <principal problem not specified> Principal Problem: <principal problem not specified>  Patient Active Problem List   Diagnosis Date Noted  . Cervical myelopathy (HCC) 09/14/2017  . Oropharyngeal dysphagia   . Acute renal failure (HCC) 09/07/2017  . Cervical spinal stenosis 09/03/2017  . Pulmonary edema, acute (HCC)   . Acute hypercapnic respiratory failure (HCC)   . Cardiac arrest due to respiratory disorder (HCC)   . 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 Cervical radiculopathy 06/25/2017  . Alcohol use 05/10/2017    Expected Discharge Date: Expected Discharge Date: 09/19/17  Team Members Present: Physician leading conference: Dr. Faith RogueZachary Swartz Social Worker Present: Amada JupiterLucy Yania Bogie, LCSW Nurse Present: Greta DoomAshley Royal, RN PT Present: Alyson ReedyElizabeth Tygielski, PT OT Present: Ardis Rowanom Lanier, COTA;Jennifer Katrinka BlazingSmith, OT SLP Present: Feliberto Gottronourtney Payne, SLP     Current Status/Progress Goal Weekly Team Focus  Medical   Cervical myelopathy with the decompression.  Patient with postoperative dysphagia due to edema  Improve.  Surgical swelling and swallowing subsequently  See above   Bowel/Bladder   continent of B/B, Last BM 12/17  Maintain continence and regular stools  Assess qshift and PRN   Swallow/Nutrition/ Hydration   MBS 12/18, recommend regular/thin liquids, demonstrated swallow startegies at Mod I level  mod I   toleration of diet and maintain hydration/nutrition   ADL's   supervison overall   mod I overall  education, safety awareness, dynamic standing balance   Mobility   modI overall  modI  higher level balance, activity tolerance, pt education   Communication             Safety/Cognition/ Behavioral Observations  WFL         Pain   C/O pain of 8 to the arms  Pain < 3  Assess Q shift and PRN, utilize available PRN's   Skin   Skin intact  prevent skin breakdown and infection  Assess Qshift and PRN    Rehab Goals Patient on target to meet rehab goals: Yes *See Care Plan and progress notes for long and short-term goals.     Barriers to Discharge  Current Status/Progress Possible Resolutions Date Resolved   Physician    Medical stability;Nutrition means        Advance swallowing as possible.  Patient was placed on prednisone to help with edema around the surgical site      Nursing                  PT  Decreased caregiver support;Lack of/limited family support  girlfriend works during day              OT                  SLP Nutrition means              SW                Discharge Planning/Teaching Needs:  Home with girlfriend who can provide  intermittent support  NA   Team Discussion:  Passed swallow study today and on reg diet with thin liquids.  Mod ind in the room.  Ready for d/c tomorrow.  Revisions to Treatment Plan:  None    Continued Need for Acute Rehabilitation Level of Care: The patient requires daily medical management by a physician with specialized training in physical medicine and rehabilitation for the following conditions: Daily direction of a multidisciplinary physical rehabilitation program to ensure safe treatment while eliciting the highest outcome that is of practical value to the patient.: Yes Daily medical management of patient stability for increased activity during participation in an intensive rehabilitation regime.: Yes Daily analysis of laboratory values and/or radiology reports with any subsequent need for medication adjustment  of medical intervention for : Neurological problems;Post surgical problems  Tericka Devincenzi 09/19/2017, 10:45 AM

## 2017-09-19 NOTE — Progress Notes (Signed)
Social Work  Discharge Note  The overall goal for the admission was met for:   Discharge location: Yes - home with girlfriend  Length of Stay: Yes - 5 days  Discharge activity level: Yes - modified independent  Home/community participation: Yes  Services provided included: MD, RD, PT, OT, SLP, RN, TR, Pharmacy and SW  Financial Services: Medicaid pending  Follow-up services arranged: DME: tub seat via [employer] and Other: home exercise program provided.    Comments (or additional information):  Patient/Family verbalized understanding of follow-up arrangements: Yes  Individual responsible for coordination of the follow-up plan: pt  Confirmed correct DME delivered: Omkar Stratmann 09/19/2017    Danis Pembleton
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
## 2017-09-21 ENCOUNTER — Ambulatory Visit (INDEPENDENT_AMBULATORY_CARE_PROVIDER_SITE_OTHER): Payer: Self-pay

## 2017-09-21 ENCOUNTER — Encounter (INDEPENDENT_AMBULATORY_CARE_PROVIDER_SITE_OTHER): Payer: Self-pay | Admitting: Orthopaedic Surgery

## 2017-09-21 ENCOUNTER — Inpatient Hospital Stay (INDEPENDENT_AMBULATORY_CARE_PROVIDER_SITE_OTHER): Payer: Self-pay | Admitting: Orthopaedic Surgery

## 2017-09-21 ENCOUNTER — Ambulatory Visit (INDEPENDENT_AMBULATORY_CARE_PROVIDER_SITE_OTHER): Payer: Self-pay | Admitting: Orthopaedic Surgery

## 2017-09-21 VITALS — BP 119/78 | HR 102

## 2017-09-21 DIAGNOSIS — Z981 Arthrodesis status: Secondary | ICD-10-CM

## 2017-09-21 NOTE — Progress Notes (Signed)
   Post-Op Visit Note   Patient: Vincent Davis           Date of Birth: 11/29/1975           MRN: [id number] Visit Date: 09/21/2017 PCP: Vincent Davis, Vincent Davis, Vincent Davis   Assessment & Plan: Postop C6-7 fusion with postoperative respiratory arrest with difficulty obtaining an airway and hematoma.  He is walking and has  his collar on.  He states he still has some tingling in his hands.  Preop pain that he had from his neck radiating into his arm is gone.  Chief Complaint: Postop cervical fusion with postop respiratory arrest Chief Complaint  Patient presents with  . Neck - Routine Post Op   Visit Diagnoses:  1. Status post cervical spinal fusion     Plan: Return in 1 month repeat flexion-extension lateral C-spine x-ray on return.  He states he is eating and swallowing well.  He asked about medicine for anxiety and I recommend he avoid these with problems with potential for respiratory suppression.Marland Kitchen.  He will do some daily walking in the neighborhood.  Repeat x-rays on return in 1 month lateral flexion-extension x-rays.  Follow-Up Instructions: Return in about 1 month (around 10/22/2017).   Orders:  Orders Placed This Encounter  Procedures  . XR Cervical Spine 2 or 3 views   No orders of the defined types were placed in this encounter.   Imaging: No results found.  PMFS History: Patient Active Problem List   Diagnosis Date Noted  . Cervical myelopathy (HCC) 09/14/2017  . Oropharyngeal dysphagia   . Acute renal failure (HCC) 09/07/2017  . Cervical spinal stenosis 09/03/2017  . Pulmonary edema, acute (HCC)   . Acute hypercapnic respiratory failure (HCC)   . Cardiac arrest due to respiratory disorder (HCC)   . 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 Cervical radiculopathy 06/25/2017  . Alcohol use 05/10/2017   Past Medical History:  Diagnosis Date  . Cervical spondylosis with radiculopathy   . Complication of anesthesia    no previous surgery  .  Family history of adverse reaction to anesthesia    "I don't know."  . Obesity (BMI 30.0-34.9)   . Pneumonia    as a child    Family History  Family history unknown: Yes    Past Surgical History:  Procedure Laterality Date  . ANTERIOR CERVICAL DECOMP/DISCECTOMY FUSION N/A 09/03/2017   Procedure: CERVICAL SIX-SEVEN  ANTERIOR CERVICAL DISCECTOMY AND FUSION, ALLOGRAFT, PLATE;  Surgeon: Eldred MangesYates, Casandra Dallaire C, MD;  Location: [facility] OR;  Service: Orthopedics;  Laterality: N/A;  . EVACUATION OF CERVICAL HEMATOMA N/A 09/07/2017   Procedure: EVACUATION OF NECK HEMATOMA;  Surgeon: Eldred MangesYates, Tayo Maute C, MD;  Location: [facility] OR;  Service: Orthopedics;  Laterality: N/A;  . NO PAST SURGERIES     Social History   Occupational History  . Not on file  Tobacco Use  . Smoking status: Light Tobacco Smoker    Types: Cigarettes  . Smokeless tobacco: Never Used  Substance and Sexual Activity  . Alcohol use: Yes    Comment: occasional  . Drug use: Yes    Types: Marijuana    Comment: last use 08/29/17  . Sexual activity: Not on file

## 2017-09-28 ENCOUNTER — Ambulatory Visit (INDEPENDENT_AMBULATORY_CARE_PROVIDER_SITE_OTHER): Payer: Self-pay | Admitting: Family Medicine

## 2017-09-28 ENCOUNTER — Encounter: Payer: Self-pay | Admitting: Family Medicine

## 2017-09-28 VITALS — BP 136/80 | HR 88 | Temp 98.7°F | Resp 16 | Ht 71.0 in | Wt 232.0 lb

## 2017-09-28 DIAGNOSIS — Z09 Encounter for follow-up examination after completed treatment for conditions other than malignant neoplasm: Secondary | ICD-10-CM

## 2017-09-28 DIAGNOSIS — I10 Essential (primary) hypertension: Secondary | ICD-10-CM

## 2017-09-28 NOTE — Progress Notes (Signed)
Patient ID: Vincent Davis, male    DOB: 07/26/1976, 41 y.o.   MRN: [id number]  PCP: Bing NeighborsHarris, Cydnee Fuquay S, FNP  Chief Complaint  Patient presents with  . Hospitalization Follow-up    Subjective:  HPI Vincent DyerSamuel Keith Creegan is a 41 y.o. male presents for hospital follow-up s/p cervical spinal fusion for treatment of cervical myelopathy.  On 09/03/2017, patient underwent an elective cervical spinal fusion procedure and post procedural cardiac arrested (PEA). He was resuscitated and required ventilator assistance. He was extubated 4 days later, NG tubed for nutritional support secondary to dysphagia. On 09/14/2017 [facility], subsequently achieving identified goals and discharged 09/19/2017. Today he presents ambulatory, although complaining of persistent arm pain. He is concern with impaired memory since hospitalization noting misplacement of items and forgetting dates. He is able to swallow and tolerate regular meals. Denies gagging or choking with food intake. He was prescribed anti-hypertension medication prior to discharge due to persistently elevated BP, and he discontinued taking medication a few days prior and would like to evaluate BP at home prior to resuming BP medications. He had post operative follow-up with Dr. Ophelia CharterYates on 09/21/2017 and has upcoming appointment scheduled with physical pain medicine 10/30/2017. He denies shortness of breath, wheezing, cough, dizziness or shortness of breath. Social History   Socioeconomic History  . Marital status: Single    Spouse name: Not on file  . Number of children: Not on file  . Years of education: Not on file  . Highest education level: Not on file  Social Needs  . Financial resource strain: Not on file  . Food insecurity - worry: Not on file  . Food insecurity - inability: Not on file  . Transportation needs - medical: Not on file  . Transportation needs - non-medical: Not on file  Occupational History  .  Not on file  Tobacco Use  . Smoking status: Light Tobacco Smoker    Types: Cigarettes  . Smokeless tobacco: Never Used  Substance and Sexual Activity  . Alcohol use: Yes    Comment: occasional  . Drug use: Yes    Types: Marijuana    Comment: last use 08/29/17  . Sexual activity: Not on file  Other Topics Concern  . Not on file  Social History Narrative  . Not on file    Family History  Family history unknown: Yes   Review of Systems  Constitutional: Positive for fatigue.  Eyes: Negative.   Respiratory: Negative.   Cardiovascular: Negative.   Musculoskeletal:       Neck pain   Psychiatric/Behavioral:       Diminished memory    Patient Active Problem List   Diagnosis Date Noted  . Cervical myelopathy (HCC) 09/14/2017  . Oropharyngeal dysphagia   . Acute renal failure (HCC) 09/07/2017  . Cervical spinal stenosis 09/03/2017  . Pulmonary edema, acute (HCC)   . Acute hypercapnic respiratory failure (HCC)   . Cardiac arrest due to respiratory disorder (HCC)   . 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 Cervical radiculopathy 06/25/2017  . Alcohol use 05/10/2017    No Known Allergies  Prior to Admission medications   Medication Sig Start Date End Date Taking? Authorizing Provider  amLODipine (NORVASC) 5 MG tablet Take 1 tablet (5 mg total) by mouth daily. 09/19/17  Yes Angiulli, Mcarthur Rossettianiel J, PA-C  aspirin 81 MG chewable tablet Chew 1 tablet (81 mg total) by mouth daily. 09/15/17  Yes Barnetta Chapelgbata, Sylvester I, MD  lisinopril (  PRINIVIL,ZESTRIL) 5 MG tablet Take 1 tablet (5 mg total) by mouth daily. 09/19/17  Yes Angiulli, Mcarthur Rossettianiel J, PA-C  methocarbamol (ROBAXIN) 500 MG tablet Take 1 tablet (500 mg total) by mouth every 6 (six) hours as needed for muscle spasms. 09/19/17  Yes Angiulli, Mcarthur Rossettianiel J, PA-C  traMADol (ULTRAM) 50 MG tablet Take 1 tablet (50 mg total) by mouth every 6 (six) hours as needed for moderate pain or severe pain (OK to crush). 09/19/17  Yes  Angiulli, Mcarthur Rossettianiel J, PA-C  predniSONE (DELTASONE) 10 MG tablet Take 1 tablet (10 mg total) by mouth 2 (two) times daily with a meal. Patient not taking: Reported on 09/21/2017 09/19/17   Angiulli, Mcarthur Rossettianiel J, PA-C  predniSONE (DELTASONE) 10 MG tablet Take 1 tablet (10 mg total) by mouth daily with breakfast. Patient not taking: Reported on 09/21/2017 09/20/17   Angiulli, Mcarthur Rossettianiel J, PA-C    Past Medical, Surgical Family and Social History reviewed and updated.    Objective:   Today's Vitals   09/28/17 1004  BP: 136/80  Pulse: 88  Resp: 16  Temp: 98.7 F (37.1 C)  TempSrc: Oral  SpO2: 98%  Weight: 232 lb (105.2 kg)  Height: 5\' 11"  (1.803 m)    Wt Readings from Last 3 Encounters:  09/28/17 232 lb (105.2 kg)  09/19/17 223 lb 5.2 oz (101.3 kg)  09/14/17 220 lb 14.4 oz (100.2 kg)   Physical Exam  Constitutional: He is oriented to person, place, and time. He appears well-developed and well-nourished.  HENT:  Head: Normocephalic and atraumatic.  Right Ear: External ear normal.  Left Ear: External ear normal.  Nose: Nose normal.  Mouth/Throat: Oropharynx is clear and moist.  Eyes: Conjunctivae and EOM are normal. Pupils are equal, round, and reactive to light.  Neck: Normal range of motion. Neck supple.  Cardiovascular: Normal rate, regular rhythm, normal heart sounds and intact distal pulses.  Pulmonary/Chest: Effort normal and breath sounds normal.  Abdominal: Soft. Bowel sounds are normal.  Musculoskeletal: Normal range of motion.  Neck brace on. Decrease ROM cervical neck s/p cervical fusion  Neurological: He is alert and oriented to person, place, and time.  Skin: Skin is warm and dry.  Surgical wound dressing, clean dry and intact  Psychiatric: He has a normal mood and affect. His behavior is normal. Judgment and thought content normal.   Assessment & Plan:  1. S/P neck surgery, follow-up exam, patient had several questions regarding his surgery and postprocedural  complications. I deferred answering any questions and advised patient to address questions and concerns with Dr. Annell GreeningMark Yates, as he performed his surgery. Will obtain CMP today to evaluate electrolyte status.   2. Essential hypertension, elevated, although stable. Advised to return in 1 week for blood pressure recheck.  Continue home monitoring and notify me if readings exceed 140/90. If pressures remain 140/80 or greater, I recommend resuming BP medication (lisinopril) and completing d/c amlodipine.   Orders Placed This Encounter  Procedures  . Comprehensive metabolic panel    RTC: 1 week for BP check and 6 months for hypertension recheck.   Godfrey PickKimberly S. Tiburcio PeaHarris, MSN, FNP-C [facility]  [address] [place], [place] [address] [phone]

## 2017-09-29 LAB — COMPREHENSIVE METABOLIC PANEL
ALBUMIN: 4 g/dL (ref 3.5–5.5)
ALK PHOS: 63 IU/L (ref 39–117)
ALT: 19 IU/L (ref 0–44)
AST: 16 IU/L (ref 0–40)
Albumin/Globulin Ratio: 1.5 (ref 1.2–2.2)
BUN / CREAT RATIO: 7 — AB (ref 9–20)
BUN: 7 mg/dL (ref 6–24)
Bilirubin Total: 0.3 mg/dL (ref 0.0–1.2)
CALCIUM: 9.4 mg/dL (ref 8.7–10.2)
CO2: 22 mmol/L (ref 20–29)
CREATININE: 0.98 mg/dL (ref 0.76–1.27)
Chloride: 103 mmol/L (ref 96–106)
GFR calc Af Amer: 110 mL/min/{1.73_m2} (ref >59)
GFR, EST NON AFRICAN AMERICAN: 95 mL/min/{1.73_m2} (ref >59)
GLOBULIN, TOTAL: 2.6 g/dL (ref 1.5–4.5)
GLUCOSE: 114 mg/dL — AB (ref 65–99)
Potassium: 4.7 mmol/L (ref 3.5–5.2)
SODIUM: 139 mmol/L (ref 134–144)
TOTAL PROTEIN: 6.6 g/dL (ref 6.0–8.5)

## 2017-10-12 ENCOUNTER — Ambulatory Visit: Payer: Self-pay | Admitting: Family Medicine

## 2017-10-12 VITALS — BP 122/68 | HR 74

## 2017-10-12 DIAGNOSIS — R413 Other amnesia: Secondary | ICD-10-CM

## 2017-10-12 NOTE — Progress Notes (Signed)
Vincent Davis, 42 year old male, presented in clinic today for blood pressure check. He recently suffered from cardiac arrest post cervical neck surgery. Since this time, he has complained of worsening memory loss. Today during blood pressure, he reports memory loss has worsened. I will place a referral for second opinion with neurology to evaluate his worsening memory loss.  Godfrey PickKimberly S. Tiburcio PeaHarris, MSN, FNP-C [facility]  [address] [place], [place] [address] [phone]

## 2017-10-16 ENCOUNTER — Encounter: Payer: Self-pay | Admitting: Family Medicine

## 2017-10-16 ENCOUNTER — Ambulatory Visit (INDEPENDENT_AMBULATORY_CARE_PROVIDER_SITE_OTHER): Payer: Self-pay | Admitting: Family Medicine

## 2017-10-16 VITALS — BP 134/80 | HR 78 | Temp 99.2°F | Ht 71.0 in | Wt 234.0 lb

## 2017-10-16 DIAGNOSIS — F458 Other somatoform disorders: Secondary | ICD-10-CM

## 2017-10-16 DIAGNOSIS — M5412 Radiculopathy, cervical region: Secondary | ICD-10-CM

## 2017-10-16 DIAGNOSIS — R0989 Other specified symptoms and signs involving the circulatory and respiratory systems: Secondary | ICD-10-CM

## 2017-10-16 DIAGNOSIS — R413 Other amnesia: Secondary | ICD-10-CM

## 2017-10-16 NOTE — Progress Notes (Signed)
Patient ID: Vincent Davis, male    DOB: May 20, 1976, 42 y.o.   MRN: [id number]  PCP: Bing Neighbors, FNP  Chief Complaint  Patient presents with  . Follow-up  . Memory Loss    Subjective:  HPI Vincent Davis is a 42 y.o. male presents for evaluation of persistent memory loss.  Reports worsening memory loss since prior to surgery. [name] suffered from cardiac arrest postoperatively and reports worsening short term memory loss. Denies dizziness, headaches, or weakness. He has already been referred to [facility] neurology. He is also having difficulty with swallowing food. During recovery postoperatively he required a feeding tube. He completed and passed a swallowing evaluation during [facility]. Today he complains of difficulty swallowing meat or solid foods. He is experiencing the sensation that food is stuck in his throat and that his throat is closing as he swallows food. He continues to have cervical neck pain which is followed and managed by Dr. Ophelia Charter [facility].Denies any known history of GERD or esophogeal strictures . He has not experienced any weight loss. Social History   Socioeconomic History  . Marital status: Single    Spouse name: Not on file  . Number of children: Not on file  . Years of education: Not on file  . Highest education level: Not on file  Social Needs  . Financial resource strain: Not on file  . Food insecurity - worry: Not on file  . Food insecurity - inability: Not on file  . Transportation needs - medical: Not on file  . Transportation needs - non-medical: Not on file  Occupational History  . Not on file  Tobacco Use  . Smoking status: Light Tobacco Smoker    Types: Cigarettes  . Smokeless tobacco: Never Used  Substance and Sexual Activity  . Alcohol use: Yes    Comment: occasional  . Drug use: Yes    Types: Marijuana    Comment: last use 08/29/17  . Sexual activity: Not on file  Other  Topics Concern  . Not on file  Social History Narrative  . Not on file    Family History  Family history unknown: Yes    Review of Systems  HENT: Positive for trouble swallowing.   Respiratory: Negative.   Cardiovascular: Negative.   Neurological: Negative.   Psychiatric/Behavioral: Positive for decreased concentration.       Impaired memory      Patient Active Problem List   Diagnosis Date Noted  . Cervical myelopathy (HCC) 09/14/2017  . Oropharyngeal dysphagia   . Acute renal failure (HCC) 09/07/2017  . Cervical spinal stenosis 09/03/2017  . Pulmonary edema, acute (HCC)   . Acute hypercapnic respiratory failure (HCC)   . Cardiac arrest due to respiratory disorder (HCC)   . 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 Cervical radiculopathy 06/25/2017  . Alcohol use 05/10/2017    No Known Allergies  Prior to Admission medications   Medication Sig Start Date End Date Taking? Authorizing Provider  amLODipine (NORVASC) 5 MG tablet Take 1 tablet (5 mg total) by mouth daily. 09/19/17  Yes Angiulli, Mcarthur Rossetti, PA-C  aspirin 81 MG chewable tablet Chew 1 tablet (81 mg total) by mouth daily. 09/15/17  Yes Berton Mount I, MD  lisinopril (PRINIVIL,ZESTRIL) 5 MG tablet Take 1 tablet (5 mg total) by mouth daily. 09/19/17  Yes Angiulli, Mcarthur Rossetti, PA-C  methocarbamol (ROBAXIN) 500 MG tablet Take 1 tablet (500 mg total) by mouth every  6 (six) hours as needed for muscle spasms. 09/19/17  Yes Angiulli, Mcarthur Rossettianiel J, PA-C  traMADol (ULTRAM) 50 MG tablet Take 1 tablet (50 mg total) by mouth every 6 (six) hours as needed for moderate pain or severe pain (OK to crush). 09/19/17  Yes Angiulli, Mcarthur Rossettianiel J, PA-C  predniSONE (DELTASONE) 10 MG tablet Take 1 tablet (10 mg total) by mouth 2 (two) times daily with a meal. Patient not taking: Reported on 10/16/2017 09/19/17   Angiulli, Mcarthur Rossettianiel J, PA-C  predniSONE (DELTASONE) 10 MG tablet Take 1 tablet (10 mg total) by mouth daily  with breakfast. Patient not taking: Reported on 10/16/2017 09/20/17   Angiulli, Mcarthur Rossettianiel J, PA-C    Past Medical, Surgical Family and Social History reviewed and updated.    Objective:   Today's Vitals   10/16/17 1029  BP: 134/80  Pulse: 78  Temp: 99.2 F (37.3 C)  TempSrc: Oral  SpO2: 98%  Weight: 234 lb (106.1 kg)  Height: 5\' 11"  (1.803 m)    Wt Readings from Last 3 Encounters:  10/16/17 234 lb (106.1 kg)  09/28/17 232 lb (105.2 kg)  09/19/17 223 lb 5.2 oz (101.3 kg)    Physical Exam  Constitutional: He is oriented to person, place, and time. He appears well-developed and well-nourished.  HENT:  Head: Normocephalic and atraumatic.  Eyes: Conjunctivae and EOM are normal. Pupils are equal, round, and reactive to light.  Neck:  Cervical neck collar on today  Cardiovascular: Normal rate, regular rhythm, normal heart sounds and intact distal pulses.  Pulmonary/Chest: Effort normal and breath sounds normal.  Neurological: He is alert and oriented to person, place, and time. Coordination normal.  Mini mental score today 23/30 indicative of  Mild dementia   Skin: Skin is warm and dry.  Psychiatric: He has a normal mood and affect. His behavior is normal. Judgment and thought content normal.   Assessment & Plan:  1. Memory loss, likely residual short-term memory loss deficit resulting from recent cardiac arrest.  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 Neurology follow-up scheduled for 12/07/2017.  2. Cervical radiculopathy, Continue follow-up with Dr. Ophelia CharterYates and Physical Medicine   3. Globus pharyngeus, worsening, no underlying diagnosis of GERD or esophageal strictures. Referring to Gastroenterology for second opinion of symptoms.  Orders Placed This Encounter  Procedures  . Ambulatory referral to Gastroenterology    Referral Priority:   Routine    Referral Type:   Consultation    Referral Reason:   Specialty Services Required    Number of Visits Requested:   1    A total of 20 minutes spent, greater than 50 % of this time was spent reviewing prior medical history, reviewing medications and indications of treatment, completing mini mental exam , discussing current plan of treatment, health promotion, and goals of treatment.    RTC: keep next scheduled follow-up    Godfrey PickKimberly S. Tiburcio PeaHarris, MSN, FNP-C [facility]  [address] [place], [place] [address] [phone]

## 2017-10-16 NOTE — Patient Instructions (Addendum)
Contact Dr. Ophelia CharterYates at [phone] to verify your appointment for 10/22/2017.     Management of Memory Problems  There are some general things you can do to help manage your memory problems.  Your memory may not in fact recover, but by using techniques and strategies you will be able to manage your memory difficulties better.  1)  Establish a routine.  Try to establish and then stick to a regular routine.  By doing this, you will get used to what to expect and you will reduce the need to rely on your memory.  Also, try to do things at the same time of day, such as taking your medication or checking your calendar first thing in the morning.  Think about think that you can do as a part of a regular routine and make a list.  Then enter them into a daily planner to remind you.  This will help you establish a routine.  2)  Organize your environment.  Organize your environment so that it is uncluttered.  Decrease visual stimulation.  Place everyday items such as keys or cell phone in the same place every day (ie.  Basket next to front door)  Use post it notes with a brief message to yourself (ie. Turn off light, lock the door)  Use labels to indicate where things go (ie. Which cupboards are for food, dishes, etc.)  Keep a notepad and pen by the telephone to take messages  3)  Memory Aids  A diary or journal/notebook/daily planner  Making a list (shopping list, chore list, to do list that needs to be done)  Using an alarm as a reminder (kitchen timer or cell phone alarm)  Using cell phone to store information (Notes, Calendar, Reminders)  Calendar/White board placed in a prominent position  Post-it notes  In order for memory aids to be useful, you need to have good habits.  It's no good remembering to make a note in your journal if you don't remember to look in it.  Try setting aside a certain time of day to look in journal.  4)  Improving mood and managing fatigue.  There may be  other factors that contribute to memory difficulties.  Factors, such as anxiety, depression and tiredness can affect memory.  Regular gentle exercise can help improve your mood and give you more energy.  Simple relaxation techniques may help relieve symptoms of anxiety  Try to get back to completing activities or hobbies you enjoyed doing in the past.  Learn to pace yourself through activities to decrease fatigue.  Find out about some local support groups where you can share experiences with others.  Try and achieve 7-8 hours of sleep at night.

## 2017-10-30 ENCOUNTER — Encounter: Payer: No Typology Code available for payment source | Admitting: Physical Medicine & Rehabilitation

## 2017-10-30 ENCOUNTER — Ambulatory Visit (INDEPENDENT_AMBULATORY_CARE_PROVIDER_SITE_OTHER): Payer: Self-pay

## 2017-10-30 ENCOUNTER — Encounter (INDEPENDENT_AMBULATORY_CARE_PROVIDER_SITE_OTHER): Payer: Self-pay | Admitting: Orthopaedic Surgery

## 2017-10-30 ENCOUNTER — Ambulatory Visit (INDEPENDENT_AMBULATORY_CARE_PROVIDER_SITE_OTHER): Payer: Self-pay | Admitting: Orthopaedic Surgery

## 2017-10-30 VITALS — BP 133/78 | HR 91

## 2017-10-30 DIAGNOSIS — Z981 Arthrodesis status: Secondary | ICD-10-CM

## 2017-10-30 MED ORDER — TRAMADOL HCL 50 MG PO TABS: 50.0000 mg | ORAL_TABLET | Freq: Four times a day (QID) | ORAL | 0 refills | Status: DC | PRN

## 2017-10-30 NOTE — Progress Notes (Signed)
Post-Op Visit Note   Patient: Vincent Davis           Date of Birth: 1976-05-25           MRN: [id number] Visit Date: 10/30/2017 PCP: Bing Neighbors, FNP   Assessment & Plan:  Chief Complaint:  Chief Complaint  Patient presents with  . Neck - Routine Post Op   Visit Diagnoses:  1. Status post cervical spinal fusion     Plan: Patient postop C6-7 fusion on 09/03/2017 with postoperative respiratory arrest followed by code and cardiopulmonary resuscitation.  States his he was told at some point he fell floor in the recovery room which is not documented in the records and I saw the patient in the recovery room and immediately postop he was awake, alert, talking, in his bed neurologically intact.  He later went to [facility] floor had some problems with mucus and then had a sudden respiratory arrest requiring intubation which was difficult.  I was present came to hospital immediately after he coded and  I was present there during the code.  Abated for about a week postop CT scans that showed a neck hematoma time of sedation attempts developed some drainage from his incision at that point on 09/07/2017 while still intubated he was taken back to the operating room for evacuation of  hematoma and replacement of drain. He now states he has some memory problems, feels  depressed at times, is able to swallow food but still has problems swallowing sometimes feels like something gets stuck in his throat.  He has some dysphonia which is improving but at this point can only talk quietly and cannot he help.  States he has an appointment coming up with neurology.  Still notes numbness and tingling in his left arm.  He is noted increased strength in his left arm.  Upper extremity reflexes right and left are intact.  Normal heel toe gait no lower extremity hyperreflexia.  Recheck 1 month.  Patient previously was a Investment banker, operational and wants to get better so that he can resume work activity.  Follow-Up Instructions: Return  in about 1 month (around 11/29/2017).   Orders:  Orders Placed This Encounter  Procedures  . XR Cervical Spine 2 or 3 views   No orders of the defined types were placed in this encounter.   Imaging: X-rays satisfactory C6-7 plate with resolution of his prevertebral swelling noted on CT scan postoperatively when he was intubated.  PMFS History: Patient Active Problem List   Diagnosis Date Noted  . Cervical myelopathy (HCC) 09/14/2017  . Oropharyngeal dysphagia   . Acute renal failure (HCC) 09/07/2017  . Cervical spinal stenosis 09/03/2017  . Pulmonary edema, acute (HCC)   . Acute hypercapnic respiratory failure (HCC)   . Cardiac arrest due to respiratory disorder (HCC)   . 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 Cervical radiculopathy 06/25/2017  . Alcohol use 05/10/2017   Past Medical History:  Diagnosis Date  . Cervical spondylosis with radiculopathy   . Complication of anesthesia    no previous surgery  . Family history of adverse reaction to anesthesia    "I don't know."  . Obesity (BMI 30.0-34.9)   . Pneumonia    as a child    Family History  Family history unknown: Yes    Past Surgical History:  Procedure Laterality Date  . ANTERIOR CERVICAL DECOMP/DISCECTOMY FUSION N/A 09/03/2017   Procedure: CERVICAL SIX-SEVEN  ANTERIOR CERVICAL DISCECTOMY AND FUSION, ALLOGRAFT, PLATE;  Surgeon: Eldred MangesYates, Lochlyn Zullo C, MD;  [facility];  Service: Orthopedics;  Laterality: N/A;  . EVACUATION OF CERVICAL HEMATOMA N/A 09/07/2017   Procedure: EVACUATION OF NECK HEMATOMA;  Surgeon: Eldred MangesYates, Alisson Rozell C, MD;  Location: [facility] OR;  Service: Orthopedics;  Laterality: N/A;  . NO PAST SURGERIES     Social History   Occupational History  . Not on file  Tobacco Use  . Smoking status: Light Tobacco Smoker    Types: Cigarettes  . Smokeless tobacco: Never Used  Substance and Sexual Activity  . Alcohol use: Yes    Comment: occasional  . Drug use: Yes    Types: Marijuana     Comment: last use 08/29/17  . Sexual activity: Not on file

## 2017-10-31 ENCOUNTER — Encounter (INDEPENDENT_AMBULATORY_CARE_PROVIDER_SITE_OTHER): Payer: Self-pay | Admitting: Orthopaedic Surgery

## 2017-11-07 ENCOUNTER — Telehealth (INDEPENDENT_AMBULATORY_CARE_PROVIDER_SITE_OTHER): Payer: Self-pay | Admitting: Orthopaedic Surgery

## 2017-11-07 NOTE — Telephone Encounter (Signed)
I called times 3 , no pick up, tried girlfriend [name], no pick up. I discussed with him that gov't rule is 12 months totally disabled and he should be healing and getting better so he can get back to work which is what he stated he wanted to do.  I will try to call him tomorrow.  FYI

## 2017-11-07 NOTE — Telephone Encounter (Signed)
Patient is wondering if the discussion between him and Dr. Ophelia CharterYates is listed in his past office visit records where Dr. Ophelia CharterYates suggested or advised him to apply for disability. Patient said this would be critical in him receiving the help he needs. He also says that the [facility] sent to the [facility]. Patient needs a call back, please advise # [phone]

## 2017-11-07 NOTE — Telephone Encounter (Signed)
noted 

## 2017-11-07 NOTE — Telephone Encounter (Signed)
I called patient, no answer, no voicemail.  Please advise on disability suggestion. I will try patient again in regards to medication and if he received it from [facility].

## 2017-11-07 NOTE — Telephone Encounter (Signed)
I called discussed. Reapplying for medicaid so he has coverage to see Neurology. He is walking and will increase activity to regain strength and stamina. Has appt at end of month of first week of March. FYI

## 2017-11-12 ENCOUNTER — Encounter
Payer: No Typology Code available for payment source | Attending: Physical Medicine & Rehabilitation | Admitting: Physical Medicine & Rehabilitation

## 2017-11-14 ENCOUNTER — Ambulatory Visit: Payer: Self-pay | Admitting: Neurology

## 2017-11-19 ENCOUNTER — Encounter: Payer: Self-pay | Admitting: Family Medicine

## 2017-11-28 ENCOUNTER — Encounter (INDEPENDENT_AMBULATORY_CARE_PROVIDER_SITE_OTHER): Payer: Self-pay | Admitting: Orthopaedic Surgery

## 2017-11-28 ENCOUNTER — Ambulatory Visit (INDEPENDENT_AMBULATORY_CARE_PROVIDER_SITE_OTHER): Payer: Self-pay | Admitting: Orthopaedic Surgery

## 2017-11-28 VITALS — BP 141/87 | HR 64 | Ht 71.0 in | Wt 234.0 lb

## 2017-11-28 DIAGNOSIS — M4722 Other spondylosis with radiculopathy, cervical region: Secondary | ICD-10-CM

## 2017-11-28 NOTE — Progress Notes (Signed)
Post-Op Visit Note   Patient: Vincent Davis           Date of Birth: 03/18/1976           MRN: [id number] Visit Date: 11/28/2017 PCP: Bing NeighborsHarris, Kimberly S, FNP   Assessment & Plan: Follow-up C6-7 anterior cervical discectomy and fusion.  Patient had postop acute respiratory arrest with CPR intubation.  Later neck hematoma evacuation when he started having some drainage from his incision several days later.  He relates he is having problems with memory not able to remember things.  He had an appointment with a neurologist but his coverage to [facility] ran out and he currently does not have an appointment.  He is noticed tingling in his arm it bothers him with activities try to do some cutting and noticed that he had cut his finger.  He is trying to increase his activities he states he burned his opposite right hand.  His upper extremity reflexes are 2+ and symmetrical.  Neck incision is well-healed.  He states he still having some problems swallowing but this is gradually improving.  Still has some mild dysphonia but this appears to be improving month-to-month.  He has not been able to work and he has having problems paying his bills.  Chief Complaint:  Chief Complaint  Patient presents with  . Neck - Routine Post Op   Visit Diagnoses: Post C6-7 cervical fusion.  Plan: He will continue to work on use of his hands with strengthening.  We discussed that his left arm should continue to gradually improve with time.  Prior to surgery had electrical test that showed C7 radiculopathy changes.  He had some disc protrusion at C4-5 and C5-6 prior to his surgery.  Follow-Up Instructions: No Follow-up on file.   Orders:  No orders of the defined types were placed in this encounter.  No orders of the defined types were placed in this encounter.   Imaging: No results found.  PMFS History: Patient Active Problem List   Diagnosis Date Noted  . Cervical myelopathy (HCC) 09/14/2017  . Oropharyngeal  dysphagia   . Acute renal failure (HCC) 09/07/2017  . Cervical spinal stenosis 09/03/2017  . Pulmonary edema, acute (HCC)   . Acute hypercapnic respiratory failure (HCC)   . Cardiac arrest due to respiratory disorder (HCC)   . 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 Cervical radiculopathy 06/25/2017  . Alcohol use 05/10/2017   Past Medical History:  Diagnosis Date  . Cervical spondylosis with radiculopathy   . Complication of anesthesia    no previous surgery  . Family history of adverse reaction to anesthesia    "I don't know."  . Obesity (BMI 30.0-34.9)   . Pneumonia    as a child    Family History  Family history unknown: Yes    Past Surgical History:  Procedure Laterality Date  . ANTERIOR CERVICAL DECOMP/DISCECTOMY FUSION N/A 09/03/2017   Procedure: CERVICAL SIX-SEVEN  ANTERIOR CERVICAL DISCECTOMY AND FUSION, ALLOGRAFT, PLATE;  Surgeon: Eldred MangesYates, Ariq Khamis C, MD;  Location: [facility] OR;  Service: Orthopedics;  Laterality: N/A;  . EVACUATION OF CERVICAL HEMATOMA N/A 09/07/2017   Procedure: EVACUATION OF NECK HEMATOMA;  Surgeon: Eldred MangesYates, Dorrell Mitcheltree C, MD;  Location: [facility] OR;  Service: Orthopedics;  Laterality: N/A;  . NO PAST SURGERIES     Social History   Occupational History  . Not on file  Tobacco Use  . Smoking status: Light Tobacco Smoker    Types: Cigarettes  .  Smokeless tobacco: Never Used  Substance and Sexual Activity  . Alcohol use: Yes    Comment: occasional  . Drug use: Yes    Types: Marijuana    Comment: last use 08/29/17  . Sexual activity: Not on file

## 2017-12-07 ENCOUNTER — Ambulatory Visit: Payer: Self-pay | Admitting: Neurology

## 2017-12-10 ENCOUNTER — Telehealth (INDEPENDENT_AMBULATORY_CARE_PROVIDER_SITE_OTHER): Payer: Self-pay | Admitting: Orthopaedic Surgery

## 2017-12-10 NOTE — Telephone Encounter (Signed)
See message below °

## 2017-12-10 NOTE — Telephone Encounter (Signed)
What exactly does he want us to state in the letter.  What is the purpose of the letter?

## 2017-12-10 NOTE — Telephone Encounter (Signed)
Patient stated that he needed updated letter for Social Service(food benefits)and need to pick up on 12/11/17.   Patient requested a call to advise

## 2017-12-11 ENCOUNTER — Encounter (INDEPENDENT_AMBULATORY_CARE_PROVIDER_SITE_OTHER): Payer: Self-pay

## 2017-12-11 NOTE — Telephone Encounter (Signed)
See message below  Please advise

## 2017-12-11 NOTE — Telephone Encounter (Signed)
Can you call patient tell him letter is ready for pick up at the front desk.

## 2017-12-11 NOTE — Telephone Encounter (Signed)
That's fine.  Just state that the patient is currently out of work.

## 2017-12-11 NOTE — Telephone Encounter (Signed)
Patient needs the letter to state that he is still out of work.  He has an appointment coming up with a Neurologist.  He needs the letter for social services to continue to get his food benefits.  Thank you.

## 2017-12-11 NOTE — Telephone Encounter (Signed)
Talked with patient and advised him that letter is ready to be picked up at the front desk.

## 2018-01-29 ENCOUNTER — Ambulatory Visit (INDEPENDENT_AMBULATORY_CARE_PROVIDER_SITE_OTHER): Payer: Self-pay | Admitting: Orthopaedic Surgery

## 2018-01-29 ENCOUNTER — Encounter (INDEPENDENT_AMBULATORY_CARE_PROVIDER_SITE_OTHER): Payer: Self-pay | Admitting: Orthopaedic Surgery

## 2018-01-29 VITALS — BP 132/84 | HR 97 | Ht 71.0 in | Wt 235.0 lb

## 2018-01-29 DIAGNOSIS — Z981 Arthrodesis status: Secondary | ICD-10-CM

## 2018-01-29 NOTE — Progress Notes (Signed)
Office Visit Note   Patient: Vincent Davis           Date of Birth: [birth year]           MRN: [id number] Visit Date: 01/29/2018              Requested by: Bing Neighbors, FNP [address] [place], [place] [address] PCP: Bing Neighbors, FNP   Assessment & Plan: Visit Diagnoses:  1. Status post cervical spinal fusion     Plan: Patient states he still has problems with anxiety, depression, memory problems.  He can talk with vocational rehabilitation about work options and he mentioned that he wants to discuss with his attorney first who is helping him with disability application.  He is continuing to try to get an appointment with neurology.  Follow-Up Instructions: Return if symptoms worsen or fail to improve.   Orders:  No orders of the defined types were placed in this encounter.  No orders of the defined types were placed in this encounter.     Procedures: No procedures performed   Clinical Data: No additional findings.   Subjective: Chief Complaint  Patient presents with  . Neck - Follow-up    HPI 42 year old male returns post C6-7 ACDF with postoperative hematoma intubation after respiratory arrest.  Patient states he is applied for disability and has an attorney is helping him.  He states he wakes up with panic attacks he states he has had problems with spasms in his arms.  He used to work in [employer] and states he has numbness in his hands.  Neck incision is well-healed.  Patient states he still has problems with depression after being in the hospital and in the ICU unit.  He states he has problems with memory.  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  He has not talked with vocational rehabilitation.  Patient states he still working on trying to see a neurologist.  Review of Systems reviewed updated unchanged from surgery in  December 2018 other than as mentioned in HPI 14 point review of systems updated.   Objective: Vital Signs: BP 132/84   Pulse 97   Ht  (1.803 m)   Wt 235 lb (106.6 kg)   BMI 32.78 kg/m   Physical Exam  Constitutional: He is oriented to person, place, and time. He appears well-developed and well-nourished.  HENT:  Head: Normocephalic and atraumatic.  Eyes: Pupils are equal, round, and reactive to light. EOM are normal.  Neck: No tracheal deviation present. No thyromegaly present.  Cardiovascular: Normal rate.  Pulmonary/Chest: Effort normal. He has no wheezes.  Abdominal: Soft. Bowel sounds are normal.  Neurological: He is alert and oriented to person, place, and time.  Skin: Skin is warm and dry. Capillary refill takes less than 2 seconds.  Psychiatric: He has a normal mood and affect. His behavior is normal. Judgment and thought content normal.    Ortho Exam extremity reflexes are 2+ and symmetrical.  Neck incision is well-healed.  Normal heel toe gait.  Specialty Comments:  No specialty comments available.  Imaging: No results found.   PMFS History: Patient Active Problem List   Diagnosis Date Noted  . Oropharyngeal dysphagia   . Acute renal failure (HCC) 09/07/2017  . Pulmonary edema, acute (HCC)   . Acute hypercapnic respiratory failure (HCC)   . Cardiac  arrest due to respiratory disorder (HCC)   . 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 Alcohol use 05/10/2017   Past Medical History:  Diagnosis Date  . Cervical spondylosis with radiculopathy   . Complication of anesthesia    no previous surgery  . Family history of adverse reaction to anesthesia    "I don't know."  . Obesity (BMI 30.0-34.9)   . Pneumonia    as a child    Family History  Family history unknown: Yes    Past Surgical History:  Procedure Laterality Date  . ANTERIOR CERVICAL DECOMP/DISCECTOMY FUSION N/A 09/03/2017   Procedure: CERVICAL SIX-SEVEN  ANTERIOR  CERVICAL DISCECTOMY AND FUSION, ALLOGRAFT, PLATE;  Surgeon: Eldred Manges, MD;  Location: [facility] OR;  Service: Orthopedics;  Laterality: N/A;  . EVACUATION OF CERVICAL HEMATOMA N/A 09/07/2017   Procedure: EVACUATION OF NECK HEMATOMA;  Surgeon: Eldred Manges, MD;  Location: [facility] OR;  Service: Orthopedics;  Laterality: N/A;  . NO PAST SURGERIES     Social History   Occupational History  . Not on file  Tobacco Use  . Smoking status: Light Tobacco Smoker    Types: Cigarettes  . Smokeless tobacco: Never Used  Substance and Sexual Activity  . Alcohol use: Yes    Comment: occasional  . Drug use: Yes    Types: Marijuana    Comment: last use 08/29/17  . Sexual activity: Not on file

## 2018-02-08 ENCOUNTER — Ambulatory Visit: Payer: No Typology Code available for payment source | Admitting: Family Medicine

## 2018-02-18 IMAGING — RF DG SWALLOWING FUNCTION - NRPT [facility]
1 series · 18 of 24 positions shown · non-contrast
Comparison: none

[Series 1: run · 7 acquisitions, 18 frames shown]
[im 1/7]
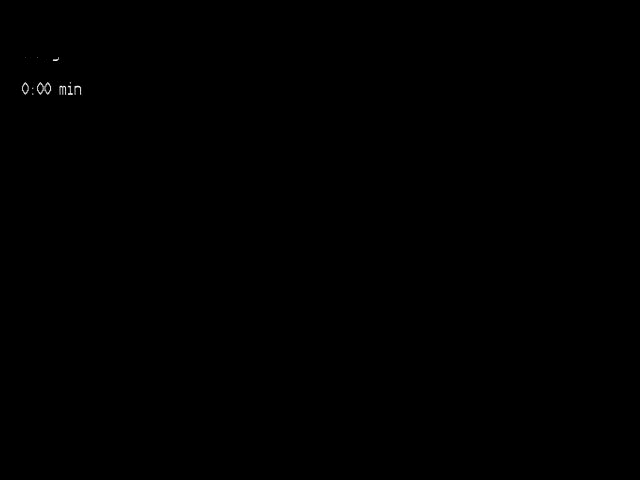
[im 1/7]
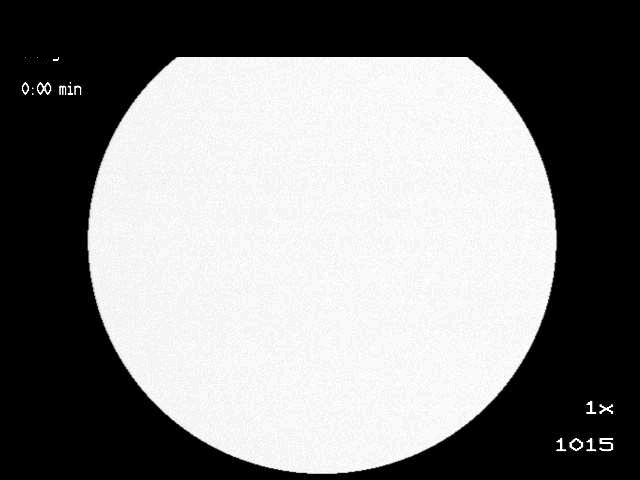
[im 2/7]
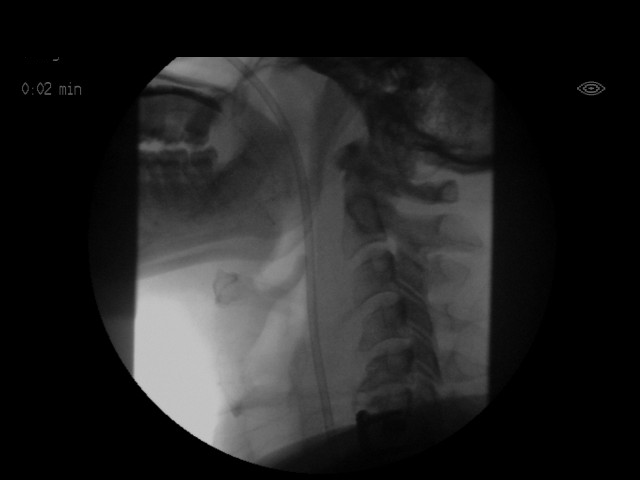
[im 2/7]
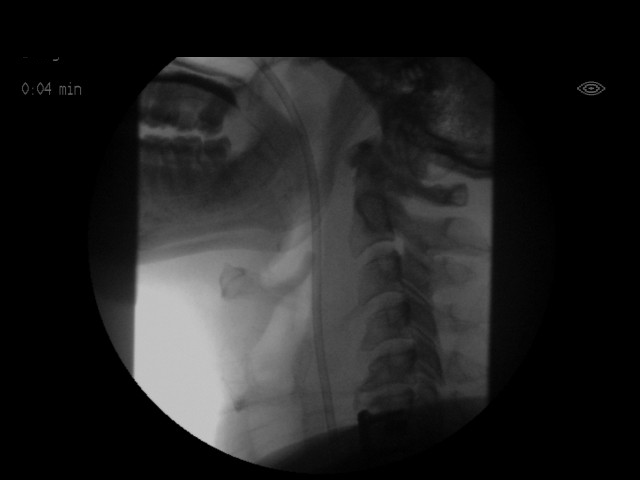
[im 2/7]
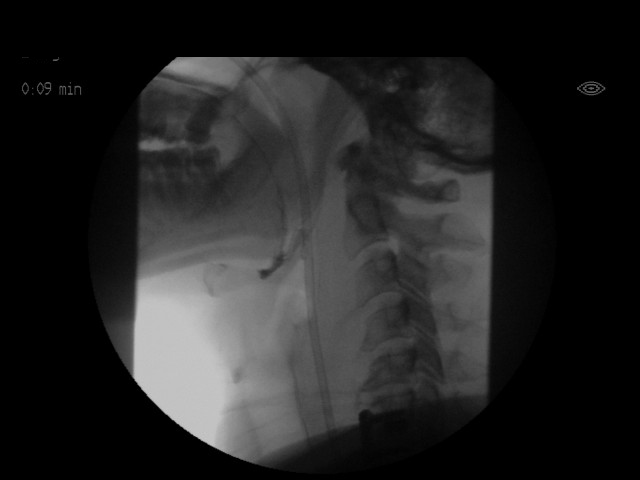
[im 3/7]
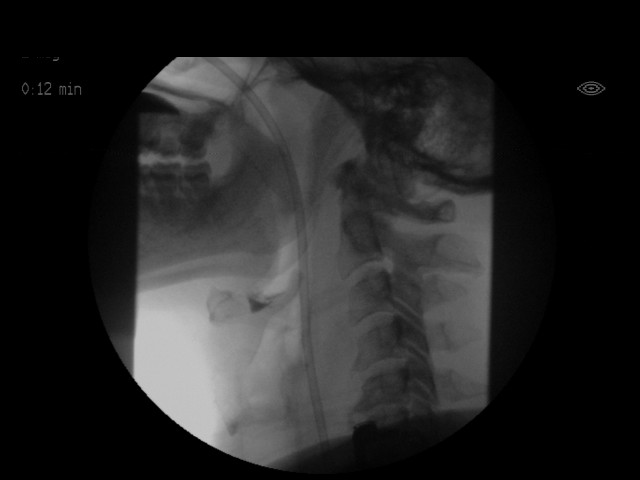
[im 3/7]
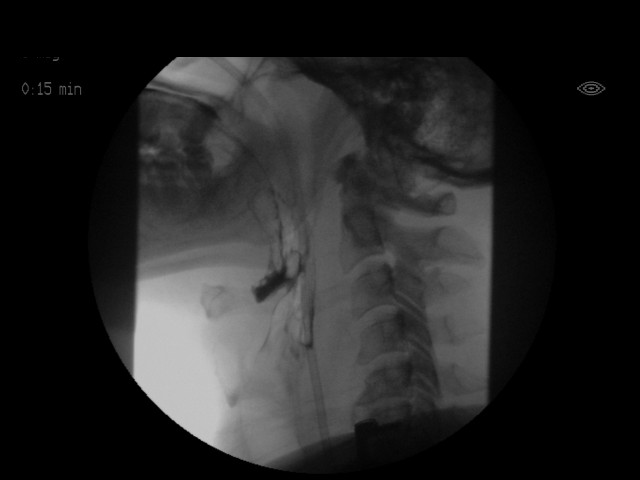
[im 4/7]
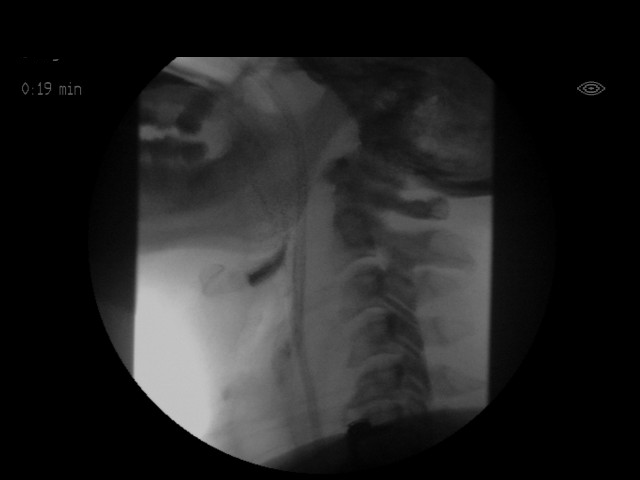
[im 4/7]
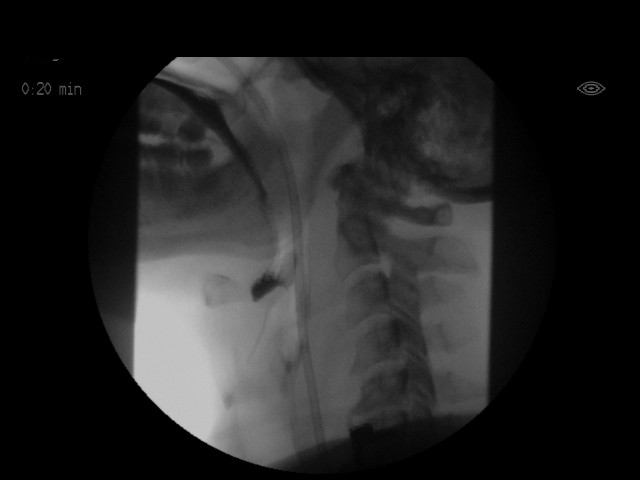
[im 4/7]
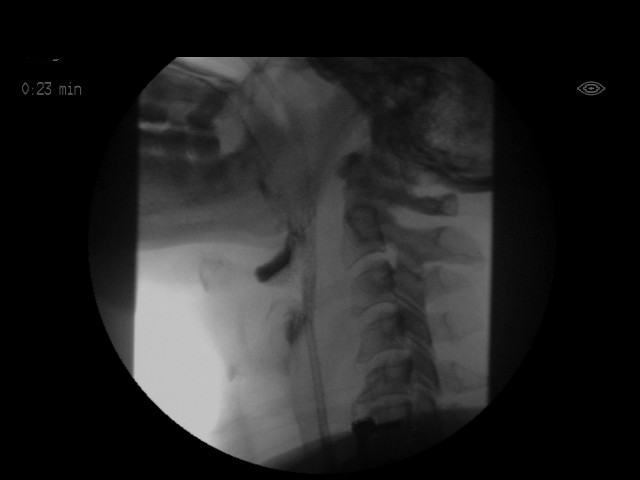
[im 5/7]
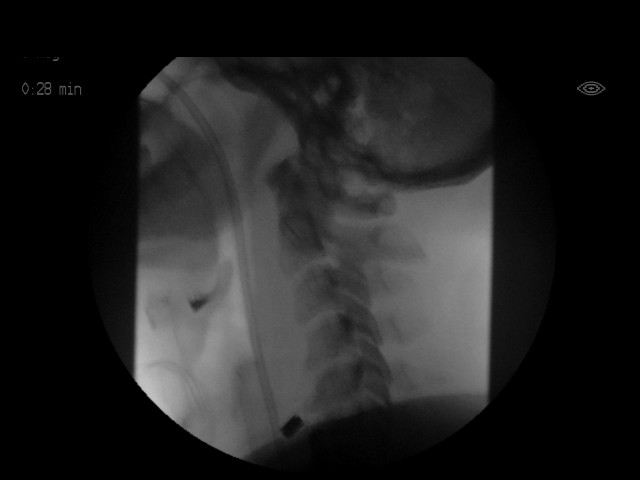
[im 5/7]
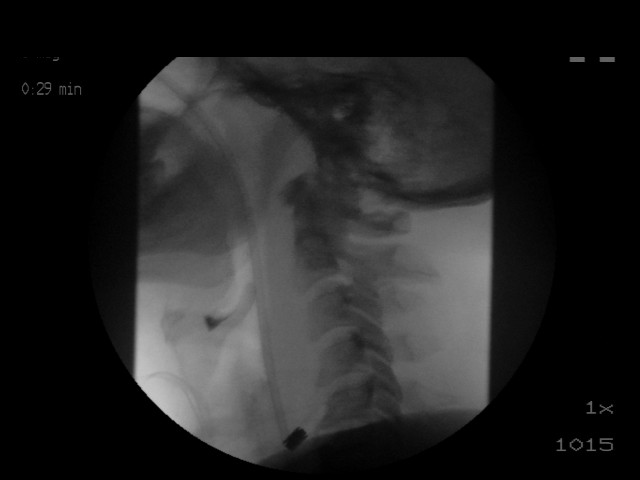
[im 5/7]
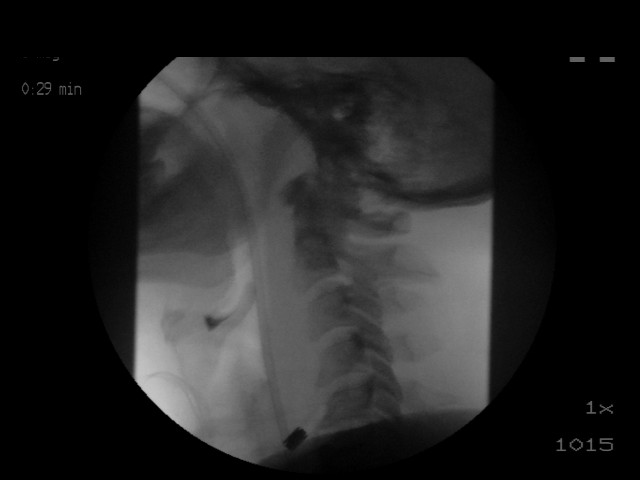
[im 6/7]
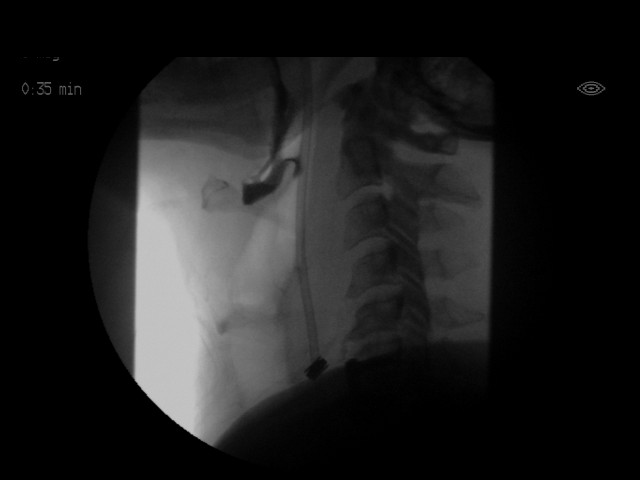
[im 6/7]
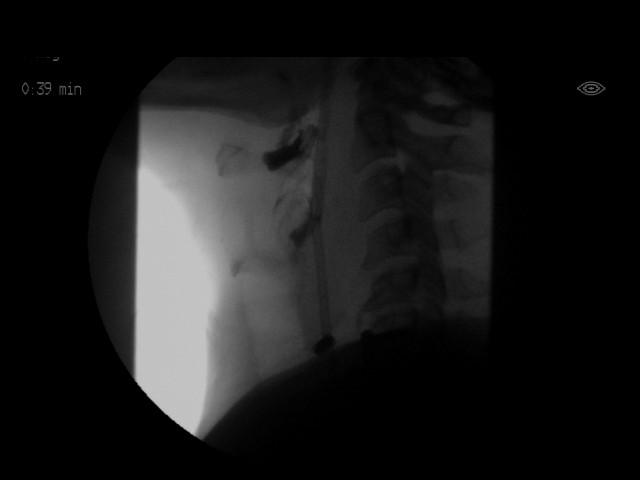
[im 6/7]
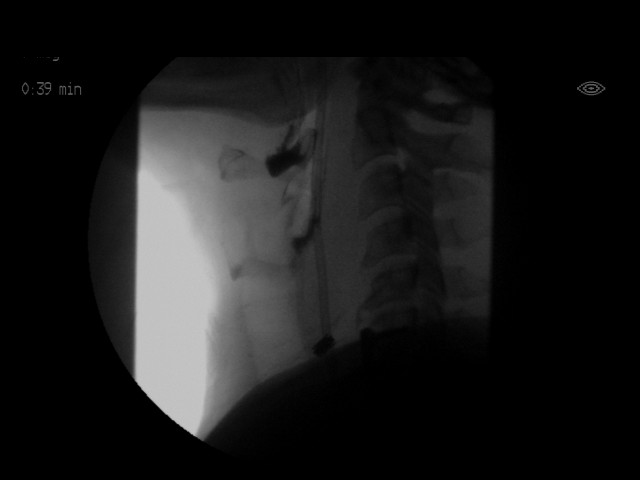
[im 7/7]
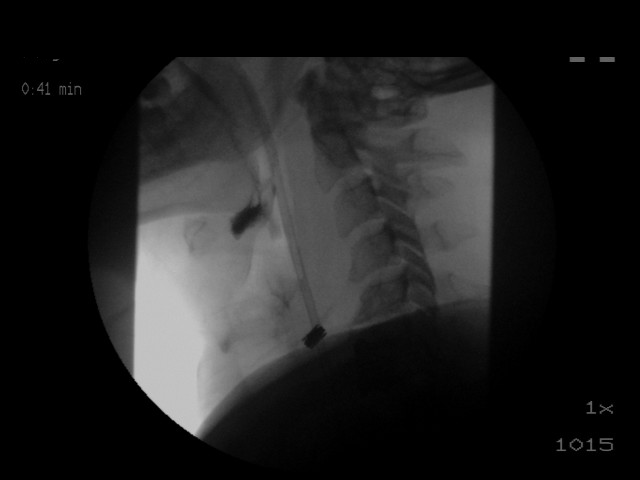
[im 7/7]
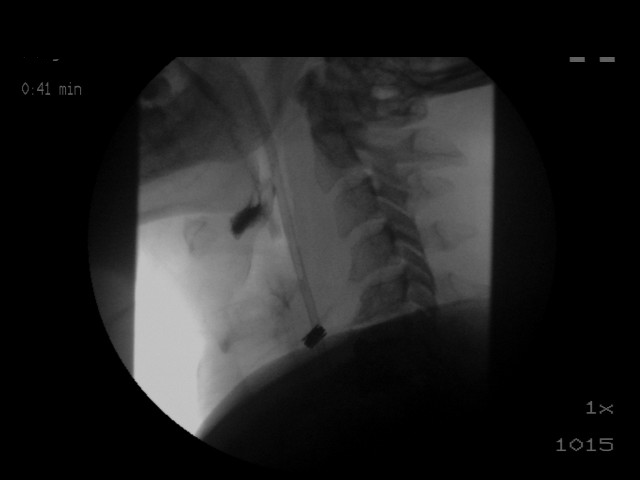

[18 of 24 positions shown; findings below may reference images not displayed]

FLUOROSCOPY FOR SWALLOWING FUNCTION STUDY:
Fluoroscopy was provided for swallowing function study, which was administered by a speech pathologist.  Final results and recommendations from this study are contained within the speech pathology report.

## 2018-02-28 ENCOUNTER — Ambulatory Visit: Payer: No Typology Code available for payment source | Admitting: Family Medicine

## 2018-03-06 ENCOUNTER — Ambulatory Visit: Payer: No Typology Code available for payment source | Admitting: Family Medicine

## 2018-03-20 ENCOUNTER — Ambulatory Visit: Payer: No Typology Code available for payment source | Admitting: Family Medicine

## 2018-03-25 ENCOUNTER — Encounter: Payer: Self-pay | Admitting: Family Medicine

## 2018-03-25 ENCOUNTER — Ambulatory Visit (INDEPENDENT_AMBULATORY_CARE_PROVIDER_SITE_OTHER): Payer: Self-pay | Admitting: Family Medicine

## 2018-03-25 VITALS — BP 146/90 | HR 80 | Temp 98.2°F | Ht 71.0 in | Wt 245.0 lb

## 2018-03-25 DIAGNOSIS — F419 Anxiety disorder, unspecified: Secondary | ICD-10-CM

## 2018-03-25 DIAGNOSIS — R202 Paresthesia of skin: Secondary | ICD-10-CM

## 2018-03-25 DIAGNOSIS — Z09 Encounter for follow-up examination after completed treatment for conditions other than malignant neoplasm: Secondary | ICD-10-CM

## 2018-03-25 DIAGNOSIS — Z131 Encounter for screening for diabetes mellitus: Secondary | ICD-10-CM

## 2018-03-25 DIAGNOSIS — R200 Anesthesia of skin: Secondary | ICD-10-CM

## 2018-03-25 DIAGNOSIS — Z9889 Other specified postprocedural states: Secondary | ICD-10-CM

## 2018-03-25 LAB — POCT URINALYSIS DIP (MANUAL ENTRY)
Bilirubin, UA: NEGATIVE
Glucose, UA: NEGATIVE mg/dL
Ketones, POC UA: NEGATIVE mg/dL
Leukocytes, UA: NEGATIVE
Nitrite, UA: NEGATIVE
Protein Ur, POC: NEGATIVE mg/dL
Spec Grav, UA: 1.02 (ref 1.010–1.025)
Urobilinogen, UA: 0.2 E.U./dL
pH, UA: 5.5 (ref 5.0–8.0)

## 2018-03-25 LAB — POCT GLYCOSYLATED HEMOGLOBIN (HGB A1C): Hemoglobin A1C: 5.6 % (ref 4.0–5.6)

## 2018-03-25 MED ORDER — TRAZODONE HCL 50 MG PO TABS: 50.0000 mg | ORAL_TABLET | Freq: Every evening | ORAL | 1 refills | Status: DC | PRN

## 2018-03-25 NOTE — Progress Notes (Addendum)
Subjective:    Patient ID: Vincent Davis, male    DOB: [birth year], 42 y.o.   MRN: [id number]   PCP: Raliegh Ip, NP  Chief Complaint  Patient presents with  . Follow-up    HTN, swallowing issues, arm pain    HPI   Current Status: She is doing well with no complaints. She denies fevers, chills, fatigue, recent infections, weight loss, and night sweats.   She has not had any headaches, visual changes, dizziness, and falls. He has been having a cough since his Cervical spine surgery on C7-C6.Marland Kitchen No chest pain, heart palpitations, and shortness of breath reported.   He experiences occasional nausea, but has no other reports of any other GI problems. She has no reports of blood in stools, dysuria and hematuria.   He has mild anxiety. He has insomnia.    He has numbness/tingling/spasm in his left arm.   He states that he was experiencing memory deficits, and his memory is improving.   He has applied for Medicaid, [facility] Financial Assistance, and for Disability.   Social History   Socioeconomic History  . Marital status: Single    Spouse name: Not on file  . Number of children: Not on file  . Years of education: Not on file  . Highest education level: Not on file  Occupational History  . Not on file  Social Needs  . Financial resource strain: Not on file  . Food insecurity:    Worry: Not on file    Inability: Not on file  . Transportation needs:    Medical: Not on file    Non-medical: Not on file  Tobacco Use  . Smoking status: Light Tobacco Smoker    Types: Cigarettes  . Smokeless tobacco: Never Used  Substance and Sexual Activity  . Alcohol use: Yes    Comment: occasional  . Drug use: Yes    Types: Marijuana    Comment: last use 08/29/17  . Sexual activity: Not on file  Lifestyle  . Physical activity:    Days per week: Not on file    Minutes per session: Not on file  . Stress: Not on file  Relationships  . Social connections:    Talks on  phone: Not on file    Gets together: Not on file    Attends religious service: Not on file    Active member of club or organization: Not on file    Attends meetings of clubs or organizations: Not on file    Relationship status: Not on file  . Intimate partner violence:    Fear of current or ex partner: Not on file    Emotionally abused: Not on file    Physically abused: Not on file    Forced sexual activity: Not on file  Other Topics Concern  . Not on file  Social History Narrative  . Not on file   Past Surgical History:  Procedure Laterality Date  . ANTERIOR CERVICAL DECOMP/DISCECTOMY FUSION N/A 09/03/2017   Procedure: CERVICAL SIX-SEVEN  ANTERIOR CERVICAL DISCECTOMY AND FUSION, ALLOGRAFT, PLATE;  Surgeon: Eldred Manges, MD;  Location: [facility] OR;  Service: Orthopedics;  Laterality: N/A;  . EVACUATION OF CERVICAL HEMATOMA N/A 09/07/2017   Procedure: EVACUATION OF NECK HEMATOMA;  Surgeon: Eldred Manges, MD;  Location: [facility] OR;  Service: Orthopedics;  Laterality: N/A;  . NO PAST SURGERIES      Immunization History  Administered Date(s) Administered  . Tdap 01/22/2017   No  outpatient medications have been marked as taking for the 03/25/18 encounter (Office Visit) with Kallie LocksStroud, Dammon Makarewicz M, FNP.   No Known Allergies  BP (!) 146/90 (BP Location: Left Arm, Patient Position: Sitting, Cuff Size: Large)   Pulse 80   Temp 98.2 F (36.8 C) (Oral)   Ht 5\' 11"  (1.803 m)   Wt 245 lb (111.1 kg)   SpO2 100%   BMI 34.17 kg/m    Review of Systems  Constitutional: Negative.   HENT: Negative.   Eyes: Negative.   Respiratory: Positive for cough (Occasional).   Cardiovascular: Negative.   Gastrointestinal: Positive for abdominal distention (Obese).  Endocrine: Negative.   Genitourinary: Negative.   Musculoskeletal: Negative.   Skin: Negative.   Allergic/Immunologic: Negative.   Neurological: Positive for dizziness (Occasional) and numbness (Left finger, hand, and arm).  Hematological:  Negative.   Psychiatric/Behavioral: Negative.    Objective:   Physical Exam  Constitutional: He is oriented to person, place, and time. He appears well-developed and well-nourished.  HENT:  Head: Normocephalic and atraumatic.  Right Ear: External ear normal.  Left Ear: External ear normal.  Nose: Nose normal.  Mouth/Throat: Oropharynx is clear and moist.  Eyes: Pupils are equal, round, and reactive to light. Conjunctivae and EOM are normal.  Neck: Normal range of motion. Neck supple.  Cardiovascular: Normal rate, regular rhythm, normal heart sounds and intact distal pulses.  Pulmonary/Chest: Effort normal and breath sounds normal.  Abdominal: Soft. Bowel sounds are normal. He exhibits distension.  Musculoskeletal: Normal range of motion.  Neurological: He is alert and oriented to person, place, and time.  Skin: Skin is warm and dry. Capillary refill takes less than 2 seconds.  Psychiatric: He has a normal mood and affect. His behavior is normal. Judgment and thought content normal.  Nursing note and vitals reviewed.  Assessment & Plan:   1. Screening for diabetes mellitus Hgb A1c is normal at 5.6 today.  - POCT glycosylated hemoglobin (Hb A1C) - POCT urinalysis dipstick  2. History of cervical spinal surgery He is s/p: Cervical 6-7 Anterior Cervical Disectomy and Fusion on 09/03/2017. He is doing well. - Ambulatory referral to Neurology  3. Numbness and tingling Stable. We will refer him to Neurology today.  - Ambulatory referral to Neurology  4. Anxiety We will send Rx for Trazodone to pharmacy today. We will also refer him to Psychiatry.  - Ambulatory referral to Psychiatry - traZODone (DESYREL) 50 MG tablet; Take 1 tablet (50 mg total) by mouth at bedtime as needed for sleep.  Dispense: 30 tablet; Refill: 1  5. Follow up He will follow up in 1 month.   Meds ordered this encounter  Medications  . traZODone (DESYREL) 50 MG tablet    Sig: Take 1 tablet (50 mg total) by  mouth at bedtime as needed for sleep.    Dispense:  30 tablet    Refill:  1    Raliegh IpNatalie Alan Drummer,  MSN, FNP-BC Patient [facility] [address]  [place], [place] [id number] [phone]     We will draw labs at next office visit.

## 2018-03-25 NOTE — Patient Instructions (Signed)
Trazodone extended release oral tablets What is this medicine? TRAZODONE (TRAZ oh done) is used to treat depression. This medicine may be used for other purposes; ask your health care provider or pharmacist if you have questions. COMMON BRAND NAME(S): Oleptro What should I tell my health care provider before I take this medicine? They need to know if you have any of these conditions: -attempted suicide or thinking about it -bipolar disorder -bleeding problems -glaucoma -heart disease, or previous heart attack -irregular heart beat -kidney disease -liver disease -low levels of sodium in the blood -an unusual or allergic reaction to trazodone, other medicines, foods, dyes or preservatives -pregnant or trying to get pregnant -breast-feeding How should I use this medicine? Take this medicine by mouth with a glass of water. Follow the directions on the prescription label. Take this medicine on an empty stomach, at least 30 minutes before or 2 hours after food. Do not take with food. Do not crush or chew this medicine. You may break in half along the score line. Take your medicine at bedtime everyday. Do not take your medicine more often than directed. Do not stop taking this medicine suddenly except upon the advice of your doctor. Stopping this medicine too quickly may cause serious side effects or your condition may worsen. A special MedGuide will be given to you by the pharmacist with each prescription and refill. Be sure to read this information carefully each time. Talk to your pediatrician regarding the use of this medicine in children. Special care may be needed. Overdosage: If you think you have taken too much of this medicine contact a poison control center or emergency room at once. NOTE: This medicine is only for you. Do not share this medicine with others. What if I miss a dose? If you miss a dose, take it as soon as you can. If it is almost time for your next dose, take only that  dose. Do not take double or extra doses. What may interact with this medicine? Do not take this medicine with any of the following medications: -certain medicines for fungal infections like fluconazole, itraconazole, ketoconazole, posaconazole, voriconazole -cisapride -dofetilide -dronedarone -linezolid -MAOIs like Carbex, Eldepryl, Marplan, Nardil, and Parnate -mesoridazine -methylene blue (injected into a vein) -pimozide -saquinavir -thioridazine -ziprasidone This medicine may also interact with the following medications: -alcohol -antiviral medicines for HIV or AIDS -aspirin and aspirin-like medicines -barbiturates like phenobarbital -certain medicines for blood pressure, heart disease, irregular heart beat -certain medicines for depression, anxiety, or psychotic disturbances -certain medicines for migraine headache like almotriptan, eletriptan, frovatriptan, naratriptan, rizatriptan, sumatriptan, zolmitriptan -certain medicines for seizures like carbamazepine, phenytoin -certain medicines for sleep -certain medicines that treat or prevent blood clots like dalteparin, enoxaparin, warfarin -digoxin -fentanyl -lithium -NSAIDS, medicines for pain and inflammation, like ibuprofen or naproxen -other medicines that prolong the QT interval (cause an abnormal heart rhythm) -rasagiline -supplements like St. John's wort, kava kava, valerian -tramadol -tryptophan This list may not describe all possible interactions. Give your health care provider a list of all the medicines, herbs, non-prescription drugs, or dietary supplements you use. Also tell them if you smoke, drink alcohol, or use illegal drugs. Some items may interact with your medicine. What should I watch for while using this medicine? Tell your doctor if your symptoms do not get better or if they get worse. Visit your doctor or health care professional for regular checks on your progress. Because it may take several weeks to  see the full effects of this medicine,   it is important to continue your treatment as prescribed by your doctor. Patients and their families should watch out for new or worsening thoughts of suicide or depression. Also watch out for sudden changes in feelings such as feeling anxious, agitated, panicky, irritable, hostile, aggressive, impulsive, severely restless, overly excited and hyperactive, or not being able to sleep. If this happens, especially at the beginning of treatment or after a change in dose, call your health care professional. You may get drowsy or dizzy. Do not drive, use machinery, or do anything that needs mental alertness until you know how this medicine affects you. Do not stand or sit up quickly, especially if you are an older patient. This reduces the risk of dizzy or fainting spells. Alcohol may interfere with the effect of this medicine. Avoid alcoholic drinks. This medicine may cause dry eyes and blurred vision. If you wear contact lenses you may feel some discomfort. Lubricating drops may help. See your eye doctor if the problem does not go away or is severe. Your mouth may get dry. Chewing sugarless gum, sucking hard candy and drinking plenty of water may help. Contact your doctor if the problem does not go away or is severe. What side effects may I notice from receiving this medicine? Side effects that you should report to your doctor or health care professional as soon as possible: -allergic reactions like skin rash, itching or hives, swelling of the face, lips, or tongue -elevated mood, decreased need for sleep, racing thoughts, impulsive behavior -confusion -fast, irregular heartbeat -feeling faint or lightheaded, falls -feeling agitated, angry, or irritable -loss of balance or coordination -painful or prolonged erections -restlessness, pacing, inability to keep still -suicidal thoughts or other mood changes -tremors -trouble sleeping -seizures -unusual bleeding or  bruising Side effects that usually do not require medical attention (report to your doctor or health care professional if they continue or are bothersome): -change in sex drive or performance -change in appetite or weight -constipation -headache -muscle aches or pains -nausea This list may not describe all possible side effects. Call your doctor for medical advice about side effects. You may report side effects to FDA at 1-800-FDA-1088. Where should I keep my medicine? Keep out of the reach of children. Store at room temperature between 15 and 30 degrees C (59 to 86 degrees F). Protect from light. Keep container tightly closed. Throw away any unused medicine after the expiration date. NOTE: This sheet is a summary. It may not cover all possible information. If you have questions about this medicine, talk to your doctor, pharmacist, or health care provider.  2018 Elsevier/Gold Standard (2016-02-17 16:55:11)  

## 2018-03-26 ENCOUNTER — Other Ambulatory Visit: Payer: Self-pay | Admitting: Family Medicine

## 2018-03-26 DIAGNOSIS — M79602 Pain in left arm: Secondary | ICD-10-CM

## 2018-03-26 MED ORDER — TRAMADOL HCL 50 MG PO TABS: 50.0000 mg | ORAL_TABLET | Freq: Three times a day (TID) | ORAL | 0 refills | Status: DC | PRN

## 2018-03-26 MED FILL — traZODone HCL 50 MG TABS: 50 | 30 days supply | Qty: 30 | Fill #0

## 2018-03-26 NOTE — Progress Notes (Signed)
Rx for Ultram to pharmacy today.  

## 2018-04-15 ENCOUNTER — Other Ambulatory Visit: Payer: No Typology Code available for payment source

## 2018-04-15 ENCOUNTER — Other Ambulatory Visit: Payer: Self-pay | Admitting: Family Medicine

## 2018-04-15 DIAGNOSIS — Z0000 Encounter for general adult medical examination without abnormal findings: Secondary | ICD-10-CM

## 2018-04-16 LAB — CBC WITH DIFFERENTIAL/PLATELET
Basophils Absolute: 0 10*3/uL (ref 0.0–0.2)
Basos: 0 %
EOS (ABSOLUTE): 0.2 10*3/uL (ref 0.0–0.4)
Eos: 2 %
Hematocrit: 45.5 % (ref 37.5–51.0)
Hemoglobin: 14.6 g/dL (ref 13.0–17.7)
Immature Grans (Abs): 0.1 10*3/uL (ref 0.0–0.1)
Immature Granulocytes: 1 %
Lymphocytes Absolute: 2 10*3/uL (ref 0.7–3.1)
Lymphs: 18 %
MCH: 29.4 pg (ref 26.6–33.0)
MCHC: 32.1 g/dL (ref 31.5–35.7)
MCV: 92 fL (ref 79–97)
Monocytes Absolute: 0.9 10*3/uL (ref 0.1–0.9)
Monocytes: 9 %
Neutrophils Absolute: 7.5 10*3/uL — ABNORMAL HIGH (ref 1.4–7.0)
Neutrophils: 70 %
Platelets: 343 10*3/uL (ref 150–450)
RBC: 4.96 x10E6/uL (ref 4.14–5.80)
RDW: 13 % (ref 12.3–15.4)
WBC: 10.7 10*3/uL (ref 3.4–10.8)

## 2018-04-16 LAB — COMPREHENSIVE METABOLIC PANEL
ALT: 31 IU/L (ref 0–44)
AST: 24 IU/L (ref 0–40)
Albumin/Globulin Ratio: 1.7 (ref 1.2–2.2)
Albumin: 4.9 g/dL (ref 3.5–5.5)
Alkaline Phosphatase: 47 IU/L (ref 39–117)
BUN/Creatinine Ratio: 8 — ABNORMAL LOW (ref 9–20)
BUN: 8 mg/dL (ref 6–24)
Bilirubin Total: 0.4 mg/dL (ref 0.0–1.2)
CO2: 23 mmol/L (ref 20–29)
Calcium: 9.7 mg/dL (ref 8.7–10.2)
Chloride: 99 mmol/L (ref 96–106)
Creatinine, Ser: 1.05 mg/dL (ref 0.76–1.27)
GFR calc Af Amer: 101 mL/min/{1.73_m2} (ref >59)
GFR calc non Af Amer: 87 mL/min/{1.73_m2} (ref >59)
Globulin, Total: 2.9 g/dL (ref 1.5–4.5)
Glucose: 127 mg/dL — ABNORMAL HIGH (ref 65–99)
Potassium: 4.7 mmol/L (ref 3.5–5.2)
Sodium: 136 mmol/L (ref 134–144)
Total Protein: 7.8 g/dL (ref 6.0–8.5)

## 2018-04-16 LAB — LIPID PANEL
Chol/HDL Ratio: 2.9 ratio (ref 0.0–5.0)
Cholesterol, Total: 181 mg/dL (ref 100–199)
HDL: 63 mg/dL (ref >39)
LDL Calculated: 92 mg/dL (ref 0–99)
Triglycerides: 130 mg/dL (ref 0–149)
VLDL Cholesterol Cal: 26 mg/dL (ref 5–40)

## 2018-04-16 LAB — TSH: TSH: 1.32 u[IU]/mL (ref 0.450–4.500)

## 2018-04-30 ENCOUNTER — Ambulatory Visit: Payer: No Typology Code available for payment source | Admitting: Family Medicine

## 2018-05-08 ENCOUNTER — Ambulatory Visit: Payer: No Typology Code available for payment source | Admitting: Family Medicine

## 2018-05-14 ENCOUNTER — Ambulatory Visit: Payer: No Typology Code available for payment source | Admitting: Family Medicine

## 2018-05-17 ENCOUNTER — Emergency Department (HOSPITAL_COMMUNITY)
Admission: EM | Admit: 2018-05-17 | Discharge: 2018-05-17 | Disposition: A | Discharge status: Home / Self Care | Payer: Self-pay | Attending: Emergency Medicine | Admitting: Emergency Medicine

## 2018-05-17 ENCOUNTER — Encounter (HOSPITAL_COMMUNITY): Payer: Self-pay | Admitting: *Deleted

## 2018-05-17 ENCOUNTER — Emergency Department (HOSPITAL_COMMUNITY): Payer: Self-pay

## 2018-05-17 ENCOUNTER — Other Ambulatory Visit: Payer: Self-pay

## 2018-05-17 DIAGNOSIS — F17210 Nicotine dependence, cigarettes, uncomplicated: Secondary | ICD-10-CM | POA: Insufficient documentation

## 2018-05-17 DIAGNOSIS — Z79899 Other long term (current) drug therapy: Secondary | ICD-10-CM | POA: Insufficient documentation

## 2018-05-17 DIAGNOSIS — M542 Cervicalgia: Secondary | ICD-10-CM | POA: Insufficient documentation

## 2018-05-17 HISTORY — DX: Phlebitis and thrombophlebitis of unspecified site: I80.9

## 2018-05-17 MED ORDER — HYDROCODONE-ACETAMINOPHEN 5-325 MG PO TABS
2.0000 | ORAL_TABLET | Freq: Once | ORAL | Status: AC
  Administered 2018-05-17: 2 via ORAL
  Filled 2018-05-17: qty 2
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
## 2018-05-17 NOTE — Discharge Instructions (Addendum)
You can take Tylenol or Ibuprofen as directed for pain. You can alternate Tylenol and Ibuprofen every 4 hours. If you take Tylenol at 1pm, then you can take Ibuprofen at 5pm. Then you can take Tylenol again at 9pm.   You can apply ice to the affected area to help with pain and swelling.  Follow-up with your primary care doctor in the next 2 to 4 days for further evaluation.  Return the emergency department for any worsening neck pain, difficulty breathing, difficulty swallowing your saliva, vomiting, difficulty moving her arms or legs, any other worsening or concerning symptoms.

## 2018-05-21 ENCOUNTER — Ambulatory Visit (INDEPENDENT_AMBULATORY_CARE_PROVIDER_SITE_OTHER): Payer: Self-pay | Admitting: Family Medicine

## 2018-05-21 ENCOUNTER — Encounter: Payer: Self-pay | Admitting: Family Medicine

## 2018-05-21 VITALS — BP 138/76 | HR 88 | Temp 98.8°F | Ht 71.0 in | Wt 245.0 lb

## 2018-05-21 DIAGNOSIS — I10 Essential (primary) hypertension: Secondary | ICD-10-CM

## 2018-05-21 DIAGNOSIS — F419 Anxiety disorder, unspecified: Secondary | ICD-10-CM

## 2018-05-21 DIAGNOSIS — R200 Anesthesia of skin: Secondary | ICD-10-CM

## 2018-05-21 DIAGNOSIS — R202 Paresthesia of skin: Secondary | ICD-10-CM

## 2018-05-21 DIAGNOSIS — M79602 Pain in left arm: Secondary | ICD-10-CM

## 2018-05-21 DIAGNOSIS — Z9889 Other specified postprocedural states: Secondary | ICD-10-CM

## 2018-05-21 DIAGNOSIS — Z09 Encounter for follow-up examination after completed treatment for conditions other than malignant neoplasm: Secondary | ICD-10-CM

## 2018-05-21 DIAGNOSIS — R413 Other amnesia: Secondary | ICD-10-CM

## 2018-05-21 MED ORDER — TRAMADOL HCL 50 MG PO TABS: 50.0000 mg | ORAL_TABLET | Freq: Three times a day (TID) | ORAL | 0 refills | Status: DC | PRN

## 2018-05-21 MED ORDER — TRAZODONE HCL 100 MG PO TABS: 100.0000 mg | ORAL_TABLET | Freq: Every evening | ORAL | 2 refills | Status: DC | PRN

## 2018-05-21 NOTE — Progress Notes (Signed)
Follow Up  Subjective:    Patient ID: Vincent Davis, male    DOB: 12/15/1975, 42 y.o.   MRN: [id number]   Chief Complaint  Patient presents with  . Follow-up    chronic condition    HPI  [name] has a past medical history of Pneumonia, Obesity, and Cervical Spondylosis. He is here today for follow up.   Current Status: Since his last office visit, he continues to have increasing situational anxiety. 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 He states that he is only been getting 3 hours of sleep a night.   He denies fevers, chills, fatigue, recent infections, weight loss, and night sweats.   He has episodes of memory loss, but overall memory is improving since surgery.   He has not had any headaches, visual changes, dizziness, and falls.   No chest pain, heart palpitations, cough and shortness of breath reported.   No reports of GI problems such as nausea, vomiting, diarrhea, and constipation. He has no reports of blood in stools, dysuria and hematuria.   No depression or anxiety reported.   Past Medical History:  Diagnosis Date  . 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 Cervical spondylosis with radiculopathy   . Complication of anesthesia    no previous surgery  . Family history of adverse reaction to anesthesia    "I don't know."  . Obesity (BMI 30.0-34.9)   . Pneumonia    as a child    Family History  Family history unknown: Yes    Social History   Socioeconomic History  . Marital status: Single    Spouse name: Not on file  . Number of children: Not on file  . Years of education: Not on file  . Highest education level: Not on file  Occupational History  . Not on file  Social Needs  . Financial resource strain: Not on file  . Food insecurity:    Worry: Not on file    Inability: Not on file  . Transportation needs:   Medical: Not on file    Non-medical: Not on file  Tobacco Use  . Smoking status: Light Tobacco Smoker    Types: Cigarettes  . Smokeless tobacco: Never Used  Substance and Sexual Activity  . Alcohol use: Yes    Comment: occasional  . Drug use: Yes    Types: Marijuana    Comment: last use 08/29/17  . Sexual activity: Not on file  Lifestyle  . Physical activity:    Days per week: Not on file    Minutes per session: Not on file  . Stress: Not on file  Relationships  . Social connections:    Talks on phone: Not on file    Gets together: Not on file    Attends religious service: Not on file    Active member of club or organization: Not on file    Attends meetings of clubs or organizations: Not on file    Relationship status: Not on file  . Intimate partner violence:    Fear of current or ex partner: Not on file    Emotionally abused: Not on file    Physically abused: Not on file    Forced sexual activity: Not on file  Other Topics Concern  . Not on file  Social History Narrative  . Not on file  Past Surgical History:  Procedure Laterality Date  . ANTERIOR CERVICAL DECOMP/DISCECTOMY FUSION N/A 09/03/2017   Procedure: CERVICAL SIX-SEVEN  ANTERIOR CERVICAL DISCECTOMY AND FUSION, ALLOGRAFT, PLATE;  Surgeon: Eldred MangesYates, Mark C, MD;  Location: [facility] OR;  Service: Orthopedics;  Laterality: N/A;  . EVACUATION OF CERVICAL HEMATOMA N/A 09/07/2017   Procedure: EVACUATION OF NECK HEMATOMA;  Surgeon: Eldred MangesYates, Mark C, MD;  Location: [facility] OR;  Service: Orthopedics;  Laterality: N/A;  . NO PAST SURGERIES      Immunization History  Administered Date(s) Administered  . Tdap 01/22/2017    Current Meds  Medication Sig  . traZODone (DESYREL) 100 MG tablet Take 1 tablet (100 mg total) by mouth at bedtime as needed for sleep.  . [DISCONTINUED] traZODone (DESYREL) 50 MG tablet Take 1 tablet (50 mg total) by mouth at bedtime as needed for sleep.    No Known Allergies  BP 138/76 (BP Location: Left  Arm, Patient Position: Sitting, Cuff Size: Large)   Pulse 88   Temp 98.8 F (37.1 C) (Oral)   Ht 5\' 11"  (1.803 m)   Wt 245 lb (111.1 kg)   SpO2 96%   BMI 34.17 kg/m      Review of Systems  Respiratory: Negative.   Cardiovascular: Negative.   Gastrointestinal: Positive for abdominal distention (Obese).  Musculoskeletal: Positive for arthralgias (Residual pain in left arm and shoulder).  Neurological: Positive for numbness (right hand).  Psychiatric/Behavioral: Positive for sleep disturbance (Insomnia). The patient is nervous/anxious (Anxiety).   All other systems reviewed and are negative.  Objective:   Physical Exam  Constitutional: He appears well-developed and well-nourished.  Pulmonary/Chest: Effort normal.  Abdominal: Soft. Bowel sounds are normal. He exhibits distension (Obese).  Musculoskeletal:  Limited ROM in left extremity  Nursing note and vitals reviewed.  Assessment & Plan:   1. Anxiety We will increase Trazodone dosage today to aide with sleep and anxiety.   [facility]. Possible referral to Psychiatry once approved. Monitor.   - traZODone (DESYREL) 100 MG tablet; Take 1 tablet (100 mg total) by mouth at bedtime as needed for sleep.  Dispense: 30 tablet; Refill: 2  2. Memory loss Stable.   3. History of cervical spinal surgery S/p: 09/2017. Stable.   4. Numbness and tingling Mild. Not worsening. He is s/p: cervical spine surgery 09/2017. We will continue to monitor.   5. Essential hypertension Blood pressure is 138/76 today. He will continue to decrease high sodium intake, excessive alcohol intake, increase potassium intake, smoking cessation, and increase physical activity of at least 30 minutes of cardio activity daily. He will continue to follow Heart Healthy or DASH diet.  6. Left arm pain He continues to experience moderate pain and discomfort in left arm. Refill Tramadol.  - traMADol (ULTRAM) 50  MG tablet; Take 1 tablet (50 mg total) by mouth every 8 (eight) hours as needed.  Dispense: 30 tablet; Refill: 0  7. Follow up He will follow up in 2 months.   Meds ordered this encounter  Medications  . traMADol (ULTRAM) 50 MG tablet    Sig: Take 1 tablet (50 mg total) by mouth every 8 (eight) hours as needed.    Dispense:  30 tablet    Refill:  0  . traZODone (DESYREL) 100 MG tablet    Sig: Take 1 tablet (100 mg total) by mouth at bedtime as needed for sleep.    Dispense:  30 tablet    Refill:  2  Kathe Becton,  MSN, FNP-C [facility] [address] [place], [place] [address] [phone]

## 2018-06-13 ENCOUNTER — Telehealth: Payer: Self-pay

## 2018-06-13 NOTE — Telephone Encounter (Signed)
Patient needs a letter stating that he is unable to work for his food stamp benefits. Patient is aware that you are out of office.    Ms. Blaine HamperHail  Fax#[phone]

## 2018-06-17 NOTE — Telephone Encounter (Signed)
Left a vm for patient to callback 

## 2018-06-17 NOTE — Telephone Encounter (Signed)
-----   Message from Kallie LocksNatalie M Stroud, FNP sent at 06/13/2018  8:33 PM EDT ----- Regarding: "Letter" Lyla Sonarrie,  Please inform patient that he will need to schedule a follow up appointment with me to discuss disability and excuse from employment. It would also be helpful if he was able to bring us a disability form provided by his case worker.  Thank you.

## 2018-06-19 ENCOUNTER — Ambulatory Visit (INDEPENDENT_AMBULATORY_CARE_PROVIDER_SITE_OTHER): Payer: Self-pay | Admitting: Family Medicine

## 2018-06-19 ENCOUNTER — Encounter: Payer: Self-pay | Admitting: Family Medicine

## 2018-06-19 VITALS — BP 130/82 | HR 74 | Temp 98.9°F | Ht 71.0 in | Wt 244.0 lb

## 2018-06-19 DIAGNOSIS — G4700 Insomnia, unspecified: Secondary | ICD-10-CM

## 2018-06-19 DIAGNOSIS — M79602 Pain in left arm: Secondary | ICD-10-CM

## 2018-06-19 DIAGNOSIS — R413 Other amnesia: Secondary | ICD-10-CM

## 2018-06-19 DIAGNOSIS — Z09 Encounter for follow-up examination after completed treatment for conditions other than malignant neoplasm: Secondary | ICD-10-CM

## 2018-06-19 DIAGNOSIS — F419 Anxiety disorder, unspecified: Secondary | ICD-10-CM

## 2018-06-19 MED ORDER — TRAZODONE HCL 100 MG PO TABS: 100.0000 mg | ORAL_TABLET | Freq: Every evening | ORAL | 2 refills | Status: DC | PRN

## 2018-06-19 MED FILL — traZODone HCL 100 MG TABS: 100 | 30 days supply | Qty: 30 | Fill #0

## 2018-06-19 NOTE — Progress Notes (Signed)
Follow Up--Paperwork  Subjective:    Patient ID: Hinton DyerSamuel Keith Ury, male    DOB: 03/29/1976, 42 y.o.   MRN: [id number]   Chief Complaint  Patient presents with  . Paperwork    for food stamps    HPI  Ms. [name] is a 42 year old male with a past medical history of Pneumonia, and Anemia.   Review of Systems      Objective:   Physical Exam      Assessment & Plan:   1. Left arm pain Stable today. Continue Tramadol as prescribed.   2. Anxiety - traZODone (DESYREL) 100 MG tablet; Take 1 tablet (100 mg total) by mouth at bedtime as needed for sleep.  Dispense: 30 tablet; Refill: 2  3. Insomnia, unspecified type Refill.  - traZODone (DESYREL) 100 MG tablet; Take 1 tablet (100 mg total) by mouth at bedtime as needed for sleep.  Dispense: 30 tablet; Refill: 2  4. Memory loss Stable. We  W  5. Follow up He will follow up 07/2018.  Meds ordered this encounter  Medications  . traZODone (DESYREL) 100 MG tablet    Sig: Take 1 tablet (100 mg total) by mouth at bedtime as needed for sleep.    Dispense:  30 tablet    Refill:  2    Raliegh IpNatalie Haleigh Desmith,  MSN, FNP-C [facility] [address]  [place], [place] [address] [phone]

## 2018-06-28 ENCOUNTER — Telehealth: Payer: Self-pay

## 2018-06-28 NOTE — Telephone Encounter (Signed)
Spoke with Child psychotherapist and they state that it's not any paperwork that they send. They just need a letter stating that the patient is unable to work.

## 2018-07-02 NOTE — Telephone Encounter (Signed)
-----   Message from Kallie Locks, FNP sent at 07/01/2018 10:52 PM EDT ----- Regarding: "Medical Excuse" Lyla Son,   Please get social worker's contact information from patient. I need to speak with her about letter.   Thank you.

## 2018-07-02 NOTE — Telephone Encounter (Signed)
Patient called back and wanted to speak and states that he will provide social workers information at that time.

## 2018-07-15 ENCOUNTER — Telehealth: Payer: Self-pay | Admitting: Family Medicine

## 2018-07-15 NOTE — Telephone Encounter (Signed)
Message left on patient's voicemail for him return call concerning necessary paperwork needed at our office for government assistance.

## 2018-07-22 ENCOUNTER — Ambulatory Visit: Payer: No Typology Code available for payment source | Admitting: Family Medicine

## 2018-07-23 ENCOUNTER — Telehealth: Payer: Self-pay

## 2018-07-23 NOTE — Telephone Encounter (Signed)
Called to verify appointment for 07/24/2018. NO answer left a message of appointment time and if unable to get to call back to reschedule. Thanks!  

## 2018-07-24 ENCOUNTER — Ambulatory Visit (INDEPENDENT_AMBULATORY_CARE_PROVIDER_SITE_OTHER): Payer: Self-pay | Admitting: Family Medicine

## 2018-07-24 ENCOUNTER — Ambulatory Visit: Payer: No Typology Code available for payment source | Admitting: Family Medicine

## 2018-07-24 ENCOUNTER — Encounter: Payer: Self-pay | Admitting: Family Medicine

## 2018-07-24 ENCOUNTER — Ambulatory Visit (HOSPITAL_COMMUNITY)
Admission: RE | Admit: 2018-07-24 | Discharge: 2018-07-24 | Disposition: A | Discharge status: Home / Self Care | Payer: No Typology Code available for payment source | Source: Ambulatory Visit | Attending: Family Medicine | Admitting: Family Medicine

## 2018-07-24 VITALS — BP 142/68 | HR 60 | Temp 98.0°F | Ht 71.0 in | Wt 249.0 lb

## 2018-07-24 DIAGNOSIS — R413 Other amnesia: Secondary | ICD-10-CM

## 2018-07-24 DIAGNOSIS — I10 Essential (primary) hypertension: Secondary | ICD-10-CM

## 2018-07-24 DIAGNOSIS — Z09 Encounter for follow-up examination after completed treatment for conditions other than malignant neoplasm: Secondary | ICD-10-CM

## 2018-07-24 DIAGNOSIS — R0789 Other chest pain: Secondary | ICD-10-CM | POA: Insufficient documentation

## 2018-07-24 DIAGNOSIS — R200 Anesthesia of skin: Secondary | ICD-10-CM

## 2018-07-24 DIAGNOSIS — G4700 Insomnia, unspecified: Secondary | ICD-10-CM

## 2018-07-24 DIAGNOSIS — M79602 Pain in left arm: Secondary | ICD-10-CM

## 2018-07-24 DIAGNOSIS — R202 Paresthesia of skin: Secondary | ICD-10-CM

## 2018-07-24 DIAGNOSIS — F419 Anxiety disorder, unspecified: Secondary | ICD-10-CM

## 2018-07-24 DIAGNOSIS — R079 Chest pain, unspecified: Secondary | ICD-10-CM

## 2018-07-24 DIAGNOSIS — Z9889 Other specified postprocedural states: Secondary | ICD-10-CM

## 2018-07-24 LAB — POCT URINALYSIS DIP (MANUAL ENTRY)
Bilirubin, UA: NEGATIVE
Blood, UA: NEGATIVE
Glucose, UA: NEGATIVE mg/dL
Ketones, POC UA: NEGATIVE mg/dL
Leukocytes, UA: NEGATIVE
Nitrite, UA: NEGATIVE
Protein Ur, POC: NEGATIVE mg/dL
Spec Grav, UA: 1.02 (ref 1.010–1.025)
Urobilinogen, UA: 0.2 E.U./dL
pH, UA: 7 (ref 5.0–8.0)

## 2018-07-24 NOTE — Progress Notes (Signed)
Follow Up  Subjective:    Patient ID: Vincent Davis, male    DOB: 06/28/1976, 42 y.o.   MRN: [id number]   Chief Complaint  Patient presents with  . Follow-up    Chronic condition     HPI  Vincent Davis is a 42 year old male with a past medical history of Pneumonia, Obesity, and Cervical Spondylosis With Radiculopathy. He is here today for follow up.   Current Status: Since his last office visit, he is doing well wi  He is s/p: Cervical 6-7 Anterior Cervical Discectomy and Fusion on 09/03/2017. He continues to experience left arm pain, which is improving. He reports occasional shortness of breath. He reports occasional cough and chest discomfort since intubation after surgery.  His anxiety is moderated today r/t his health status and his inability to work. His anxiety is increased. He has not been able to work his previous occupation in [place], because of numbness and tingling in his left arm. He denies suicidal ideations, homicidal ideations, or auditory hallucinations. He states that his memory is improving.  He denies fevers, chills, fatigue, recent infections, weight loss, and night sweats. He has not had any headaches, visual changes, dizziness, and falls. No chest pain, heart palpitations, reported cough reported. No reports of GI problems such as nausea, vomiting, diarrhea, and constipation. He has no reports of blood in stools, dysuria and hematuria.   Review of Systems  Respiratory: Positive for cough (occasional).   Cardiovascular: Negative.        Reports chest discomfort. No signs of distress noted.   Gastrointestinal: Positive for abdominal distention (obese).  Genitourinary: Negative.   Neurological: Positive for weakness and numbness (tingling in his left arm r/t recent cervical surgery).   Objective:   Physical Exam  Constitutional: He is oriented to person, place, and time. He appears well-developed and well-nourished.  Neck: Normal range of motion. Neck supple.   Numbness and tingling  Cardiovascular: Normal rate, regular rhythm, normal heart sounds and intact distal pulses.  Pulmonary/Chest: Effort normal and breath sounds normal.  Abdominal: Soft. Bowel sounds are normal.  Neurological: He is alert and oriented to person, place, and time.  Psychiatric: He has a normal mood and affect. His behavior is normal. Judgment and thought content normal.  Increased anxiety   Nursing note and vitals reviewed.   Assessment & Plan:   1. Chest pain of uncertain etiology Stable. He reports mild cough and chest discomfort. Monitor.  - DG Chest 2 View; Future  2. Numbness and tingling - Ambulatory referral to Neurology  3. History of cervical spinal surgery - Ambulatory referral to Neurology  4. Left arm pain Mild. Reports numbness/tingling.   5. Anxiety Previous referral to Psychiatry 03/2018. We will continue to monitor and possibly begin anti-anxiety medication at next office visit.   6. Insomnia, unspecified type Continue Trazodone as prescribed.   7. Memory loss Improved. Continue to monitor.   8. Essential hypertension Blood pressure is stable at 142/68 today. He will continue antihypertensive medications as prescribed. Monitor.   9. Follow up He will follow up in 3 months. - POCT urinalysis dipstick   Referral Orders     Ambulatory referral to Neurology   Vincent Ip,  MSN, FNP-C [facility] [address] [place], [place] [address] [phone]

## 2018-08-01 ENCOUNTER — Other Ambulatory Visit: Payer: Self-pay

## 2018-08-01 ENCOUNTER — Encounter (HOSPITAL_COMMUNITY): Payer: Self-pay

## 2018-08-01 ENCOUNTER — Emergency Department (HOSPITAL_COMMUNITY): Payer: Self-pay

## 2018-08-01 ENCOUNTER — Emergency Department (HOSPITAL_COMMUNITY)
Admission: EM | Admit: 2018-08-01 | Discharge: 2018-08-01 | Disposition: A | Discharge status: Home / Self Care | Payer: Self-pay | Attending: Emergency Medicine | Admitting: Emergency Medicine

## 2018-08-01 DIAGNOSIS — M542 Cervicalgia: Secondary | ICD-10-CM | POA: Insufficient documentation

## 2018-08-01 DIAGNOSIS — Y92410 Unspecified street and highway as the place of occurrence of the external cause: Secondary | ICD-10-CM | POA: Insufficient documentation

## 2018-08-01 DIAGNOSIS — Y998 Other external cause status: Secondary | ICD-10-CM | POA: Insufficient documentation

## 2018-08-01 DIAGNOSIS — F17210 Nicotine dependence, cigarettes, uncomplicated: Secondary | ICD-10-CM | POA: Insufficient documentation

## 2018-08-01 DIAGNOSIS — S0990XA Unspecified injury of head, initial encounter: Secondary | ICD-10-CM | POA: Insufficient documentation

## 2018-08-01 DIAGNOSIS — Y939 Activity, unspecified: Secondary | ICD-10-CM | POA: Insufficient documentation

## 2018-08-01 MED ORDER — KETOROLAC TROMETHAMINE 15 MG/ML IJ SOLN
15.0000 mg | Freq: Once | INTRAMUSCULAR | Status: AC
  Administered 2018-08-01: 15 mg via INTRAMUSCULAR
  Filled 2018-08-01: qty 1

## 2018-08-01 MED ORDER — METHOCARBAMOL 500 MG PO TABS: 500.0000 mg | ORAL_TABLET | Freq: Two times a day (BID) | ORAL | 0 refills | Status: DC
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
## 2018-08-08 ENCOUNTER — Telehealth: Payer: Self-pay

## 2018-08-09 NOTE — Telephone Encounter (Signed)
Left a vm for patient to callback 
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
## 2018-08-13 ENCOUNTER — Ambulatory Visit (INDEPENDENT_AMBULATORY_CARE_PROVIDER_SITE_OTHER): Payer: Self-pay | Admitting: Family Medicine

## 2018-08-13 ENCOUNTER — Encounter: Payer: Self-pay | Admitting: Family Medicine

## 2018-08-13 VITALS — BP 124/78 | HR 90 | Temp 98.0°F | Ht 71.0 in | Wt 245.0 lb

## 2018-08-13 DIAGNOSIS — R0602 Shortness of breath: Secondary | ICD-10-CM

## 2018-08-13 DIAGNOSIS — M545 Low back pain, unspecified: Secondary | ICD-10-CM

## 2018-08-13 DIAGNOSIS — R200 Anesthesia of skin: Secondary | ICD-10-CM

## 2018-08-13 DIAGNOSIS — Z09 Encounter for follow-up examination after completed treatment for conditions other than malignant neoplasm: Secondary | ICD-10-CM

## 2018-08-13 DIAGNOSIS — R202 Paresthesia of skin: Secondary | ICD-10-CM

## 2018-08-13 DIAGNOSIS — M79602 Pain in left arm: Secondary | ICD-10-CM

## 2018-08-13 MED ORDER — MELOXICAM 15 MG PO TABS
15.0000 mg | ORAL_TABLET | Freq: Every day | ORAL | 3 refills | Status: DC
Start: 2018-08-13 — End: 2018-12-11

## 2018-08-13 MED ORDER — ALBUTEROL SULFATE HFA 108 (90 BASE) MCG/ACT IN AERS: 2.0000 | INHALATION_SPRAY | Freq: Four times a day (QID) | RESPIRATORY_TRACT | 12 refills | Status: DC | PRN

## 2018-08-13 NOTE — Patient Instructions (Signed)
Meloxicam tablets What is this medicine? MELOXICAM (mel OX i cam) is a non-steroidal anti-inflammatory drug (NSAID). It is used to reduce swelling and to treat pain. It may be used for osteoarthritis, rheumatoid arthritis, or juvenile rheumatoid arthritis. This medicine may be used for other purposes; ask your health care provider or pharmacist if you have questions. COMMON BRAND NAME(S): Mobic What should I tell my health care provider before I take this medicine? They need to know if you have any of these conditions: -bleeding disorders -cigarette smoker -coronary artery bypass graft (CABG) surgery within the past 2 weeks -drink more than 3 alcohol-containing drinks per day -heart disease -high blood pressure -history of stomach bleeding -kidney disease -liver disease -lung or breathing disease, like asthma -stomach or intestine problems -an unusual or allergic reaction to meloxicam, aspirin, other NSAIDs, other medicines, foods, dyes, or preservatives -pregnant or trying to get pregnant -breast-feeding How should I use this medicine? Take this medicine by mouth with a full glass of water. Follow the directions on the prescription label. You can take it with or without food. If it upsets your stomach, take it with food. Take your medicine at regular intervals. Do not take it more often than directed. Do not stop taking except on your doctor's advice. A special MedGuide will be given to you by the pharmacist with each prescription and refill. Be sure to read this information carefully each time. Talk to your pediatrician regarding the use of this medicine in children. While this drug may be prescribed for selected conditions, precautions do apply. Patients over 65 years old may have a stronger reaction and need a smaller dose. Overdosage: If you think you have taken too much of this medicine contact a poison control center or emergency room at once. NOTE: This medicine is only for you. Do  not share this medicine with others. What if I miss a dose? If you miss a dose, take it as soon as you can. If it is almost time for your next dose, take only that dose. Do not take double or extra doses. What may interact with this medicine? Do not take this medicine with any of the following medications: -cidofovir -ketorolac This medicine may also interact with the following medications: -aspirin and aspirin-like medicines -certain medicines for blood pressure, heart disease, irregular heart beat -certain medicines for depression, anxiety, or psychotic disturbances -certain medicines that treat or prevent blood clots like warfarin, enoxaparin, dalteparin, apixaban, dabigatran, rivaroxaban -cyclosporine -digoxin -diuretics -methotrexate -other NSAIDs, medicines for pain and inflammation, like ibuprofen and naproxen -pemetrexed This list may not describe all possible interactions. Give your health care provider a list of all the medicines, herbs, non-prescription drugs, or dietary supplements you use. Also tell them if you smoke, drink alcohol, or use illegal drugs. Some items may interact with your medicine. What should I watch for while using this medicine? Tell your doctor or healthcare professional if your symptoms do not start to get better or if they get worse. Do not take other medicines that contain aspirin, ibuprofen, or naproxen with this medicine. Side effects such as stomach upset, nausea, or ulcers may be more likely to occur. Many medicines available without a prescription should not be taken with this medicine. This medicine can cause ulcers and bleeding in the stomach and intestines at any time during treatment. This can happen with no warning and may cause death. There is increased risk with taking this medicine for a long time. Smoking, drinking alcohol, older age,   and poor health can also increase risks. Call your doctor right away if you have stomach pain or blood in your  vomit or stool. This medicine does not prevent heart attack or stroke. In fact, this medicine may increase the chance of a heart attack or stroke. The chance may increase with longer use of this medicine and in people who have heart disease. If you take aspirin to prevent heart attack or stroke, talk with your doctor or health care professional. What side effects may I notice from receiving this medicine? Side effects that you should report to your doctor or health care professional as soon as possible: -allergic reactions like skin rash, itching or hives, swelling of the face, lips, or tongue -nausea, vomiting -signs and symptoms of a blood clot such as breathing problems; changes in vision; chest pain; severe, sudden headache; pain, swelling, warmth in the leg; trouble speaking; sudden numbness or weakness of the face, arm, or leg -signs and symptoms of bleeding such as bloody or black, tarry stools; red or dark-brown urine; spitting up blood or brown material that looks like coffee grounds; red spots on the skin; unusual bruising or bleeding from the eye, gums, or nose -signs and symptoms of liver injury like dark yellow or brown urine; general ill feeling or flu-like symptoms; light-colored stools; loss of appetite; nausea; right upper belly pain; unusually weak or tired; yellowing of the eyes or skin -signs and symptoms of stroke like changes in vision; confusion; trouble speaking or understanding; severe headaches; sudden numbness or weakness of the face, arm, or leg; trouble walking; dizziness; loss of balance or coordination Side effects that usually do not require medical attention (report to your doctor or health care professional if they continue or are bothersome): -constipation -diarrhea -gas This list may not describe all possible side effects. Call your doctor for medical advice about side effects. You may report side effects to FDA at 1-800-FDA-1088. Where should I keep my  medicine? Keep out of the reach of children. Store at room temperature between 15 and 30 degrees C (59 and 86 degrees F). Throw away any unused medicine after the expiration date. NOTE: This sheet is a summary. It may not cover all possible information. If you have questions about this medicine, talk to your doctor, pharmacist, or health care provider.  2018 Elsevier/Gold Standard (2015-10-20 19:28:16)  
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
## 2018-08-14 ENCOUNTER — Ambulatory Visit (INDEPENDENT_AMBULATORY_CARE_PROVIDER_SITE_OTHER): Payer: Self-pay | Admitting: Licensed Clinical Social Worker

## 2018-08-14 DIAGNOSIS — F4310 Post-traumatic stress disorder, unspecified: Secondary | ICD-10-CM
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
## 2018-08-19 ENCOUNTER — Ambulatory Visit (INDEPENDENT_AMBULATORY_CARE_PROVIDER_SITE_OTHER): Payer: Self-pay | Admitting: Psychiatry

## 2018-08-19 ENCOUNTER — Encounter (HOSPITAL_COMMUNITY): Payer: Self-pay | Admitting: Psychiatry

## 2018-08-19 VITALS — BP 120/78 | HR 86 | Ht 71.0 in | Wt 245.0 lb

## 2018-08-19 DIAGNOSIS — F331 Major depressive disorder, recurrent, moderate: Secondary | ICD-10-CM

## 2018-08-19 DIAGNOSIS — F4310 Post-traumatic stress disorder, unspecified: Secondary | ICD-10-CM

## 2018-08-19 MED ORDER — ESCITALOPRAM OXALATE 10 MG PO TABS: 10.0000 mg | ORAL_TABLET | Freq: Every day | ORAL | 1 refills | Status: DC

## 2018-08-19 MED FILL — ESCITALOPRAM 10 MG TABLET: 10 | 30 days supply | Qty: 30 | Fill #0

## 2018-08-19 NOTE — Patient Instructions (Signed)
Take half tablet a day for first 5 days and then one a day

## 2018-08-19 NOTE — Progress Notes (Signed)
Psychiatric Initial Adult Assessment   Patient Identification: Vincent Davis MRN:  [id number] Date of Evaluation:  08/19/2018 Referral Source: Therapist Chief Complaint:   Chief Complaint    Establish Care     Visit Diagnosis:    ICD-10-CM   1. MDD (major depressive disorder), recurrent episode, moderate (HCC) F33.1   2. Posttraumatic stress disorder F43.10     History of Present Illness: 42 years old currently single African-American male living with his girlfriend referred by his therapist for management of PTSD and depression  Patient had an incident in December 2018 when he went for a cervical surgery C6-C7 during postop he noticed swelling on his right side and notified the nurse later on he said that he felt that he could not breathe and he woke up after 8 days because of a possible blood clot.  York SpanielSaid he was in the hospital for 1 month.  States he had experienced near death  experience and had seen spirits in his room at the he is going to die or live when he woke up he got very paranoid also he had to call police because he could not understand what is happening.  He is recently approved for 100% coverage with [employer].  He has started therapy and therapist has referred him over here he endorses still having flashbacks he keeps on thinking about that incident about the paranoia that he has experienced at that time he still hears whispering sounds and he has to keep some noise while he sleeping he does not sleep more than 3 to 4 hours he gets startled easily he reexperiences the trauma and feels that it has affected his life he is not able to work he still has left side pain he started going back to cooking but he had made mistakes he burned himself couple of times. He feels numb to his surroundings he feels slow sluggish and still depressed feeling down decreased energy he is not able to continue with activities that is upsetting to him he excessively worries about his physical  done about his future. He avoids hospital and even if he has to go to a hospital building even if it is not for clinical meeting he still gets panicky contrast avoided and has a recurrence of flashbacks  Feels that he is not able to cope up with the stress and has not gone back to work and he still focuses and has flashbacks that has led him to avoid people with crowds he has lost a few friends he is financially in a difficult situation and that upsets him even further.  Modifying factors somewhat girlfriend.  Mom but she lives away brother but he has 6 kids he is busy in his own life Duration since December 2018 Timing most of the part of the day  He used marijuana one time edible and it helped him but he understands he is not to take it regularly he has drank here and there but not on a regular basis  No prior past psychiatric history before December 2018  As of now he still endorses hearing a whistling sound at night and being very cautious at nighttime keeping a light on  Associated Signs/Symptoms: Depression Symptoms:  depressed mood, fatigue, anxiety, loss of energy/fatigue, disturbed sleep, (Hypo) Manic Symptoms:  Distractibility, Anxiety Symptoms:  Excessive Worry, Psychotic Symptoms:  Paranoia, PTSD Symptoms: See above  Past Psychiatric History: denies prior treatment before 2018  Previous Psychotropic Medications: No   Substance Abuse History  in the last 12 months:  No.  Consequences of Substance Abuse: NA  Past Medical History:  Past Medical History:  Diagnosis Date  . 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 Cervical spondylosis with radiculopathy   . Complication of anesthesia    no previous surgery  . Family history of adverse reaction to anesthesia    "I don't know."  . Obesity (BMI 30.0-34.9)   . Pneumonia    as a child    Past Surgical History:  Procedure Laterality Date  . ANTERIOR CERVICAL DECOMP/DISCECTOMY FUSION N/A 09/03/2017    Procedure: CERVICAL SIX-SEVEN  ANTERIOR CERVICAL DISCECTOMY AND FUSION, ALLOGRAFT, PLATE;  Surgeon: Eldred Manges, MD;  Location: [facility] OR;  Service: Orthopedics;  Laterality: N/A;  . EVACUATION OF CERVICAL HEMATOMA N/A 09/07/2017   Procedure: EVACUATION OF NECK HEMATOMA;  Surgeon: Eldred Manges, MD;  Location: [facility] OR;  Service: Orthopedics;  Laterality: N/A;  . NO PAST SURGERIES      Family Psychiatric History: denies, but says most of them may have some . coudlnt elaborate any more  Family History:  Family History  Problem Relation Age of Onset  . Other Mother        Healthy  . Other Father        Healthy    Social History:   Social History   Socioeconomic History  . Marital status: Single    Spouse name: Not on file  . Number of children: Not on file  . Years of education: Not on file  . Highest education level: Not on file  Occupational History  . Not on file  Social Needs  . Financial resource strain: Not on file  . Food insecurity:    Worry: Not on file    Inability: Not on file  . Transportation needs:    Medical: Not on file    Non-medical: Not on file  Tobacco Use  . Smoking status: Former Smoker    Types: Cigarettes  . Smokeless tobacco: Never Used  Substance and Sexual Activity  . Alcohol use: Yes    Comment: occasional  . Drug use: Yes    Types: Marijuana    Comment: last use 08/29/17  . Sexual activity: Not on file  Lifestyle  . Physical activity:    Days per week: Not on file    Minutes per session: Not on file  . Stress: Not on file  Relationships  . Social connections:    Talks on phone: Not on file    Gets together: Not on file    Attends religious service: Not on file    Active member of club or organization: Not on file    Attends meetings of clubs or organizations: Not on file    Relationship status: Not on file  Other Topics Concern  . Not on file  Social History Narrative  . Not on file    Additional Social History: Grew up with his  mom.  Patient dad died when patient was age 24 he also grew up with his grandmother no trauma.  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 [employer] prior to this incident not married but he has 2 grown kids  Allergies:  No Known Allergies  Metabolic Disorder Labs: Lab Results  Component Value Date   HGBA1C 5.6 03/25/2018   No results found for: PROLACTIN Lab Results  Component Value Date  CHOL 181 04/15/2018   TRIG 130 04/15/2018   HDL 63 04/15/2018   CHOLHDL 2.9 04/15/2018   LDLCALC 92 04/15/2018   Lab Results  Component Value Date   TSH 1.320 04/15/2018    Therapeutic Level Labs: No results found for: LITHIUM No results found for: CBMZ No results found for: VALPROATE  Current Medications: Current Outpatient Medications  Medication Sig Dispense Refill  . albuterol (PROVENTIL HFA;VENTOLIN HFA) 108 (90 Base) MCG/ACT inhaler Inhale 2 puffs into the lungs every 6 (six) hours as needed for wheezing or shortness of breath. 1 Inhaler 12  . meloxicam (MOBIC) 15 MG tablet Take 1 tablet (15 mg total) by mouth daily. 30 tablet 3  . traMADol (ULTRAM) 50 MG tablet Take 1 tablet (50 mg total) by mouth every 8 (eight) hours as needed. 30 tablet 0  . traZODone (DESYREL) 100 MG tablet Take 1 tablet (100 mg total) by mouth at bedtime as needed for sleep. 30 tablet 2  . aspirin 81 MG chewable tablet Chew 1 tablet (81 mg total) by mouth daily. (Patient not taking: Reported on 03/25/2018) 30 tablet 0  . escitalopram (LEXAPRO) 10 MG tablet Take 1 tablet (10 mg total) by mouth daily. Take half tablet a day for first 5 days and then one a day 30 tablet 1  . methocarbamol (ROBAXIN) 500 MG tablet Take 1 tablet (500 mg total) by mouth 2 (two) times daily. (Patient not taking: Reported on 08/13/2018) 10 tablet 0   No current facility-administered medications for this visit.     Musculoskeletal: Strength & Muscle Tone:  decreased Gait & Station: slow Patient leans: no lean  Psychiatric Specialty Exam: Review of Systems  Skin: Negative for rash.  Neurological: Negative for tremors.  Psychiatric/Behavioral: Positive for depression. Negative for substance abuse. The patient is nervous/anxious.     Blood pressure 120/78, pulse 86, height 5\' 11"  (1.803 m), weight 245 lb (111.1 kg).Body mass index is 34.17 kg/m.  General Appearance: Casual  Eye Contact:  Fair  Speech:  Slow  Volume:  Decreased  Mood:  Anxious and Dysphoric  Affect:  Constricted  Thought Process:  Goal Directed  Orientation:  Full (Time, Place, and Person)  Thought Content:  Hallucinations: Auditory, Paranoid Ideation and Rumination. Whispering sound at night  Suicidal Thoughts:  No  Homicidal Thoughts:  No  Memory:  Immediate;   Fair Recent;   Fair  Judgement:  Fair  Insight:  Shallow  Psychomotor Activity:  Decreased  Concentration:  Decreased   Recall:  Fair  Fund of Knowledge:Fair  Language: Fair  Akathisia:  No  Handed:  Right  AIMS (if indicated):  not done  Assets:  Desire for Improvement Social Support  ADL's:  Intact  Cognition: WNL  Sleep:  Poor   Screenings: PHQ2-9     Office Visit from 08/13/2018 in [facility] Office Visit from 07/24/2018 in [facility] Office Visit from 05/21/2018 in [facility] Office Visit from 03/25/2018 in [facility] Office Visit from 10/16/2017 in [facility]  PHQ-2 Total Score  0  0  0  0  0      Assessment and Plan: as follows PTSD: Continue therapy . Discussed medication options will start lexapro 5mg  increse to 10mg  for depression and PTSd MDD, moderate; fatigue, weight gain and decreased interest. Will start lexapro, continue therpy Cervical pain : fu with neurology and possible pain clinic  Discussed in  detail about having a general trying to write down things that we will keep him  moving forward have some time to read and go for walk.  Continue therapy more than 50% time spent in counseling and coordination of care including patient education reviewed side effects and concerns were addressed discussed other option of medication as well we will start her Lexapro as of now. Says cannot work has burn couple of times, diffiuclty foucsing and easily distracted with worries and flashbacks going on  FU 3-4 weeks or earlier if needed   Thresa Ross, MD 11/18/20199:55 AM

## 2018-08-28 MED FILL — traZODone HCL 100 MG TABS: 100 | 30 days supply | Qty: 30 | Fill #0

## 2018-08-28 MED FILL — MELOXICAM 15 MG TABLET: 15 | 30 days supply | Qty: 30 | Fill #0

## 2018-08-28 MED FILL — traMADol HCL 50 MG TABS: 50 | 10 days supply | Qty: 30 | Fill #0

## 2018-08-28 MED FILL — METHOCARBAMOL 500 MG TABS: 500 | 5 days supply | Qty: 10 | Fill #0

## 2018-08-28 MED FILL — ALBUTEROL SULFATE HFA 108 (: 108 (90 BAS | 25 days supply | Qty: 18 | Fill #0

## 2018-09-03 ENCOUNTER — Ambulatory Visit (HOSPITAL_COMMUNITY): Payer: Self-pay | Admitting: Psychiatry

## 2018-09-03 ENCOUNTER — Ambulatory Visit (INDEPENDENT_AMBULATORY_CARE_PROVIDER_SITE_OTHER): Payer: Self-pay | Admitting: Licensed Clinical Social Worker

## 2018-09-03 DIAGNOSIS — F4310 Post-traumatic stress disorder, unspecified: Secondary | ICD-10-CM

## 2018-09-03 NOTE — Progress Notes (Signed)
   THERAPIST PROGRESS NOTE  Session Time: 3:40pm-4:10pm  Participation Level: Active  Behavioral Response: CasualAlertAngry, Anxious and Depressed  Type of Therapy: Individual Therapy  Treatment Goals addressed: Coping  Interventions: CBT, Motivational Interviewing and Supportive  Summary: Hinton DyerSamuel Keith Rodin is a 42 y.o. male who presents with symptoms of depression and PTSD. Client reports today is the anniversary of his traumatic health event. Client reports staying in bed today until attending this appointment. Client struggled to remain engaged in the moment and not focused on ruminating thoughts. Client reports he will continue to take his Lexapro even though he does not feel it is helping. Client completed 5 senses mindfulness skill in session when prompted but is unsure he will use the skill outside of session. Client requested session be cut short this day and next appointment be moved sooner due to feeling overwhelmed. Client denied SI however made passing comment about 'at least I didn't think about taking all my pills again.'   Suicidal/Homicidal: Nowithout intent/plan  Therapist Response: Clinician checked in with client assessing for SI/HI/psychosis. Clinician inquired about living situation and continued support or stress of the relationship. Clinician provided psycho-education related to continuing medication management and not making changes or stopping without consulting medical staff. Clinician presented mindfulness activity to assist client in remaining calmer and in the moment when feeling triggered. Clinician prompted client multiple times to complete activity when clint appeared overwhelmed however client only engaged on the initial practice with clinician.   Plan: Return again in 3 weeks.  Diagnosis: Axis I: Post Traumatic Stress Disorder    Axis II: R/O  Cluster B traits    Harlon DittyKarissa A Taquana Bartley, LCSW 09/03/2018

## 2018-09-06 ENCOUNTER — Ambulatory Visit (INDEPENDENT_AMBULATORY_CARE_PROVIDER_SITE_OTHER): Payer: Self-pay | Admitting: Psychiatry

## 2018-09-06 ENCOUNTER — Encounter (HOSPITAL_COMMUNITY): Payer: Self-pay | Admitting: Psychiatry

## 2018-09-06 DIAGNOSIS — F331 Major depressive disorder, recurrent, moderate: Secondary | ICD-10-CM

## 2018-09-06 DIAGNOSIS — F4310 Post-traumatic stress disorder, unspecified: Secondary | ICD-10-CM

## 2018-09-06 MED ORDER — ESCITALOPRAM OXALATE 20 MG PO TABS: 20.0000 mg | ORAL_TABLET | Freq: Every day | ORAL | 1 refills | Status: DC

## 2018-09-06 NOTE — Progress Notes (Signed)
[facility] Outpatient Follow up visit   Patient Identification: Vincent Davis MRN:  [id number] Date of Evaluation:  09/06/2018 Referral Source: Therapist Chief Complaint:    Visit Diagnosis:    ICD-10-CM   1. Posttraumatic stress disorder F43.10   2. MDD (major depressive disorder), recurrent episode, moderate (HCC) F33.1     History of Present Illness: 42 years old currently single African-American male living with his girlfriend, initially referred by his therapist for management of PTSD and depression  Patient had an incident in December 2018 when he went for a cervical surgery C6-C7 during postop he noticed swelling on his right side and notified the nurse later on he said that he felt that he could not breathe and he woke up after 8 days because of a possible blood clot.  York Spaniel he was in the hospital for 1 month.  States he had experienced near death  experience He is recently approved for 100% coverage with [employer].  He has started therapy   He avoid hospitals, clinic, triggers remind him of incident Says when he tried to work, he burned himself, gets unfocus  Last visit lexapro was started, now on 10mg . 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 Has applied for disability Some whisteling sound and seeing a shadow behind on the sides at times. He has to turn his head around.   No side effects reported  Modifying factors somewhat girlfriend. Mom but she lives away Duration since December 2018 Timing most of the part of the day  He used marijuana one time edible and it helped him but he understands he is not to take it regularly he has drank here and there but not on a regular basis  No prior past psychiatric history  before December 2018     Past Psychiatric History: denies prior treatment before 2018  Previous Psychotropic Medications: No   Substance Abuse History in the last 12 months:  No.  Consequences of Substance Abuse: NA  Past Medical History:  Past Medical History:  Diagnosis Date  . 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 Cervical spondylosis with radiculopathy   . Complication of anesthesia    no previous surgery  . Family history of adverse reaction to anesthesia    "I don't know."  . Obesity (BMI 30.0-34.9)   . Pneumonia    as a child    Past Surgical History:  Procedure Laterality Date  . ANTERIOR CERVICAL DECOMP/DISCECTOMY FUSION N/A 09/03/2017   Procedure: CERVICAL SIX-SEVEN  ANTERIOR CERVICAL DISCECTOMY AND FUSION, ALLOGRAFT, PLATE;  Surgeon: Eldred Manges, MD;  Location: [facility] OR;  Service: Orthopedics;  Laterality: N/A;  . EVACUATION OF CERVICAL HEMATOMA N/A 09/07/2017   Procedure: EVACUATION OF NECK HEMATOMA;  Surgeon: Eldred Manges, MD;  Location: [facility] OR;  Service: Orthopedics;  Laterality: N/A;  . NO PAST SURGERIES      Family Psychiatric History: denies, but says most of them may have some . coudlnt elaborate any more  Family History:  Family History  Problem Relation Age of Onset  . Other Mother        Healthy  . Other Father        Healthy    Social  History:   Social History   Socioeconomic History  . Marital status: Single    Spouse name: Not on file  . Number of children: Not on file  . Years of education: Not on file  . Highest education level: Not on file  Occupational History  . Not on file  Social Needs  . Financial resource strain: Not on file  . Food insecurity:    Worry: Not on file    Inability: Not on file  . Transportation needs:    Medical: Not on file    Non-medical: Not on file  Tobacco Use  . Smoking status: Former Smoker    Types: Cigarettes  . Smokeless tobacco: Never Used  Substance and Sexual Activity  .  Alcohol use: Yes    Comment: occasional  . Drug use: Yes    Types: Marijuana    Comment: last use 08/29/17  . Sexual activity: Not on file  Lifestyle  . Physical activity:    Days per week: Not on file    Minutes per session: Not on file  . Stress: Not on file  Relationships  . Social connections:    Talks on phone: Not on file    Gets together: Not on file    Attends religious service: Not on file    Active member of club or organization: Not on file    Attends meetings of clubs or organizations: Not on file    Relationship status: Not on file  Other Topics Concern  . Not on file  Social History Narrative  . Not on file    Allergies:  No Known Allergies  Metabolic Disorder Labs: Lab Results  Component Value Date   HGBA1C 5.6 03/25/2018   No results found for: PROLACTIN Lab Results  Component Value Date   CHOL 181 04/15/2018   TRIG 130 04/15/2018   HDL 63 04/15/2018   CHOLHDL 2.9 04/15/2018   LDLCALC 92 04/15/2018   Lab Results  Component Value Date   TSH 1.320 04/15/2018    Therapeutic Level Labs: No results found for: LITHIUM No results found for: CBMZ No results found for: VALPROATE  Current Medications: Current Outpatient Medications  Medication Sig Dispense Refill  . albuterol (PROVENTIL HFA;VENTOLIN HFA) 108 (90 Base) MCG/ACT inhaler Inhale 2 puffs into the lungs every 6 (six) hours as needed for wheezing or shortness of breath. 1 Inhaler 12  . aspirin 81 MG chewable tablet Chew 1 tablet (81 mg total) by mouth daily. (Patient not taking: Reported on 03/25/2018) 30 tablet 0  . escitalopram (LEXAPRO) 20 MG tablet Take 1 tablet (20 mg total) by mouth daily. 30 tablet 1  . meloxicam (MOBIC) 15 MG tablet Take 1 tablet (15 mg total) by mouth daily. 30 tablet 3  . methocarbamol (ROBAXIN) 500 MG tablet Take 1 tablet (500 mg total) by mouth 2 (two) times daily. (Patient not taking: Reported on 08/13/2018) 10 tablet 0  . traMADol (ULTRAM) 50 MG tablet Take 1  tablet (50 mg total) by mouth every 8 (eight) hours as needed. 30 tablet 0  . traZODone (DESYREL) 100 MG tablet Take 1 tablet (100 mg total) by mouth at bedtime as needed for sleep. 30 tablet 2   No current facility-administered medications for this visit.       Psychiatric Specialty Exam: Review of Systems  Cardiovascular: Negative for chest pain.  Skin: Negative for rash.  Neurological: Negative for tremors.  Psychiatric/Behavioral: Positive for depression. Negative for substance abuse.  There were no vitals taken for this visit.There is no height or weight on file to calculate BMI.  General Appearance: Casual  Eye Contact:  Fair  Speech:  Slow  Volume:  Decreased  Mood:  subdued  Affect:  Constricted  Thought Process:  Goal Directed  Orientation:  Full (Time, Place, and Person)  Hallucinations as if a shadow at the corner, whisteling sounds at time  Suicidal Thoughts:  No  Homicidal Thoughts:  No  Memory:  Immediate;   Fair Recent;   Fair  Judgement:  Fair  Insight:  Shallow  Psychomotor Activity:  Decreased  Concentration:  Decreased   Recall:  Fair  Fund of Knowledge:Fair  Language: Fair  Akathisia:  No  Handed:  Right  AIMS (if indicated):  not done  Assets:  Desire for Improvement Social Support  ADL's:  Intact  Cognition: WNL  Sleep:  Poor   Screenings: PHQ2-9     [facility] Visit from 08/13/2018 in [facility] Office Visit from 07/24/2018 in [facility] Office Visit from 05/21/2018 in [facility] Office Visit from 03/25/2018 in [facility] Office Visit from 10/16/2017 in [facility]  PHQ-2 Total Score  0  0  0  0  0      Assessment and Plan: as follows PTSD: ongoing, continue therapy. Increase lexapro to 15mg  for 4 days and then 20mg .  MDD, moderate; subdued. Increase lexapro as above, continue therapy and more frequent session if can  Cervical pain : fu with  neurology and possible pain clinic  Discussed in detail about having a general trying to write down things that we will keep him moving forward have some time to read and go for walk.  Continue therapy more than 50% time spent in counseling and coordination of care including patient education reviewed side effects and concerns were addressed  FU 3-4 weeks or earlier if needed   Thresa RossNadeem Seema Blum, MD 12/6/20199:53 AM

## 2018-09-10 ENCOUNTER — Ambulatory Visit (HOSPITAL_COMMUNITY): Payer: Self-pay | Admitting: Licensed Clinical Social Worker

## 2018-09-18 ENCOUNTER — Ambulatory Visit: Payer: No Typology Code available for payment source | Admitting: Diagnostic Neuroimaging

## 2018-10-04 ENCOUNTER — Ambulatory Visit: Payer: Self-pay | Admitting: Family Medicine

## 2018-10-07 ENCOUNTER — Ambulatory Visit (INDEPENDENT_AMBULATORY_CARE_PROVIDER_SITE_OTHER): Payer: Self-pay | Admitting: Licensed Clinical Social Worker

## 2018-10-07 DIAGNOSIS — F4310 Post-traumatic stress disorder, unspecified: Secondary | ICD-10-CM

## 2018-10-08 NOTE — Progress Notes (Signed)
   THERAPIST PROGRESS NOTE  Session Time: 4pm-4:50pm  Participation Level: Active  Behavioral Response: CasualAlertAnxious, Depressed and Irritable  Type of Therapy: Individual Therapy  Treatment Goals addressed: Coping  Interventions: CBT, Motivational Interviewing and Supportive  Summary: Dornell Grasmick is a 43 y.o. male who presents with major depressive disorder and anxiety related to PTSD.   Suicidal/Homicidal: Yeswithout intent/plan. Client reports no current plan but recently thought about taking all of his medication again.  Therapist Response: Clinician met with client, assessing for SI/HI/psychosis and overall level of functioning. Clinician inquired about severity of symptoms and skills used to manage symptoms since last visit. Client verbalized continued frustration with living situation and relationship. Client shared his holidays were fine but he continues to isolate. Client endorsed ongoing feelings of panic as well as nightmares and ruminating thoughts related to hospitalization. Client states he does not remember last visit with clinician on anniversary of complications with surgery. Clinician reviewed with client 5 senses grounding skill. Clinician provided client with psychoeducation on PTSD  Symptoms, including commonly reported body sensations. Clinician encouraged client to document when he feels symptoms of panic, and related thoughts, feelings, and situations to help identify additional triggers.  Plan: Return again in 3 weeks.  Diagnosis: Axis I: Major Depression, Recurrent severe and Post Traumatic Stress Disorder     Olegario Messier, LCSW 10/07/2018

## 2018-10-21 IMAGING — DX DG NECK SOFT TISSUE
2 series · 2 of 2 positions shown · non-contrast
Comparison: Cervical spine radiographs 09/21/2017.

CLINICAL DATA: Choking sensation while riding in a car. Anterior
neck pain. History of cervical fusion 8 months ago.

EXAM:
NECK SOFT TISSUES - 1+ VIEW

[neck lat]
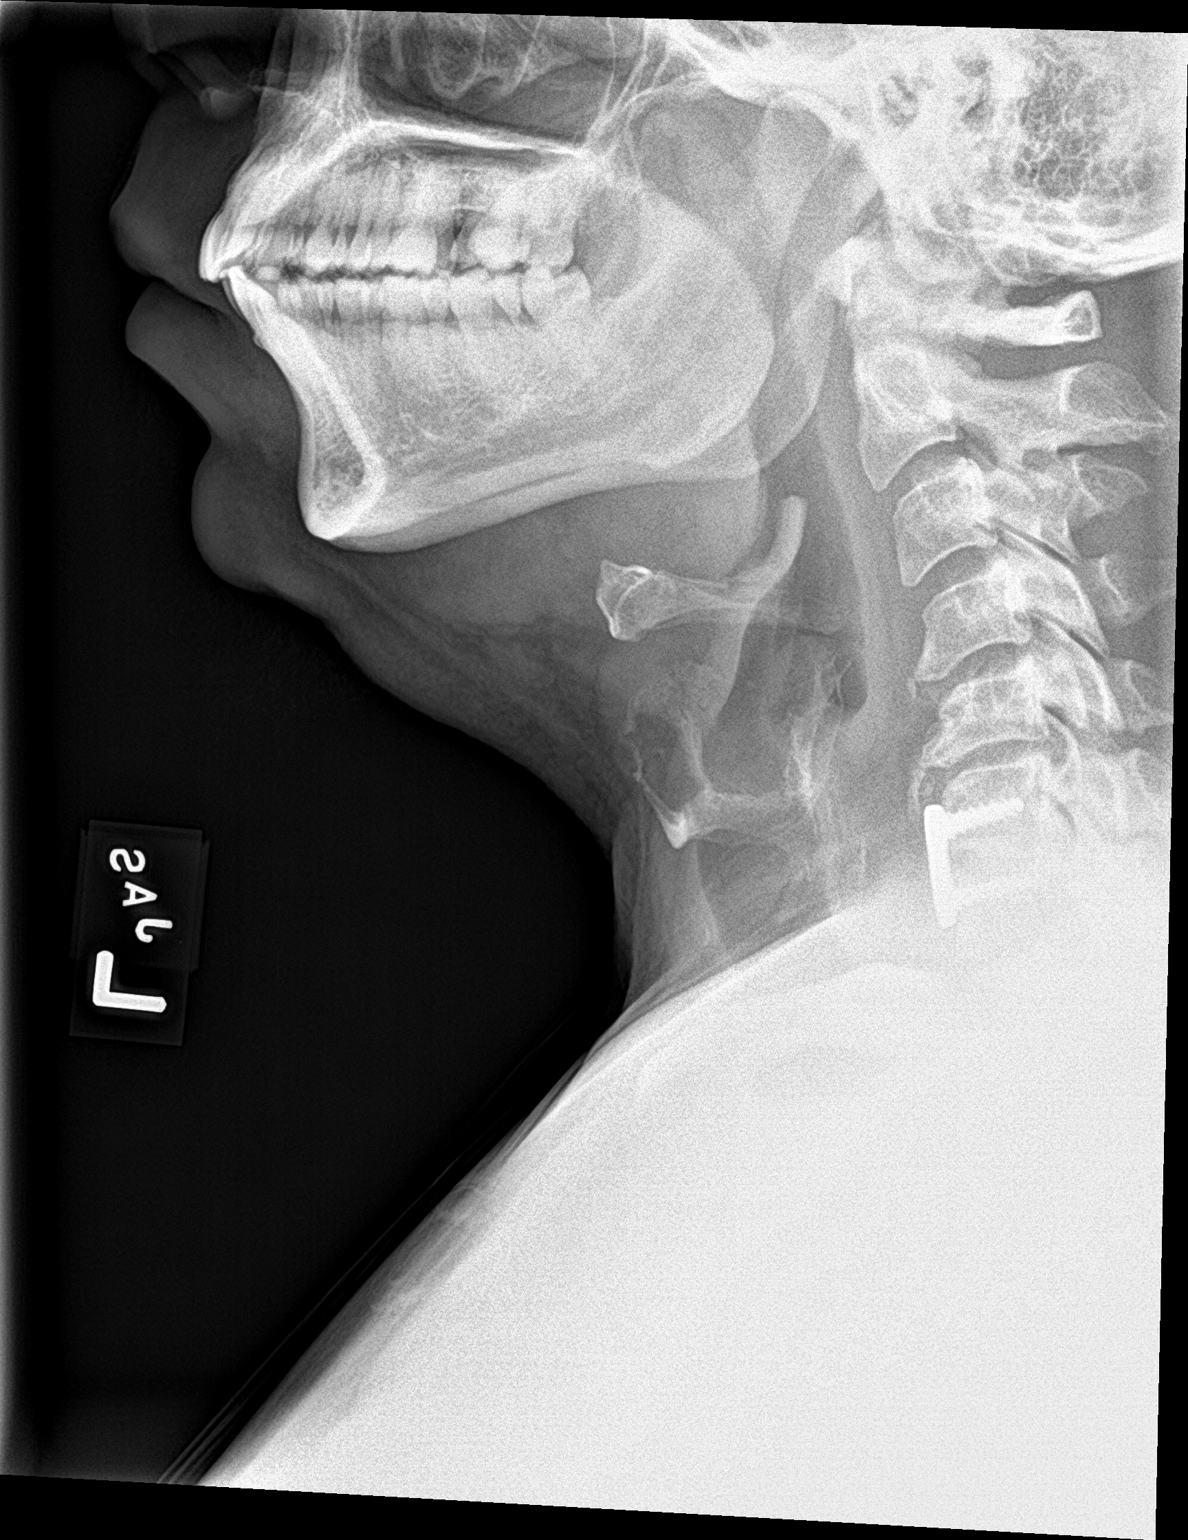

[neck ap]
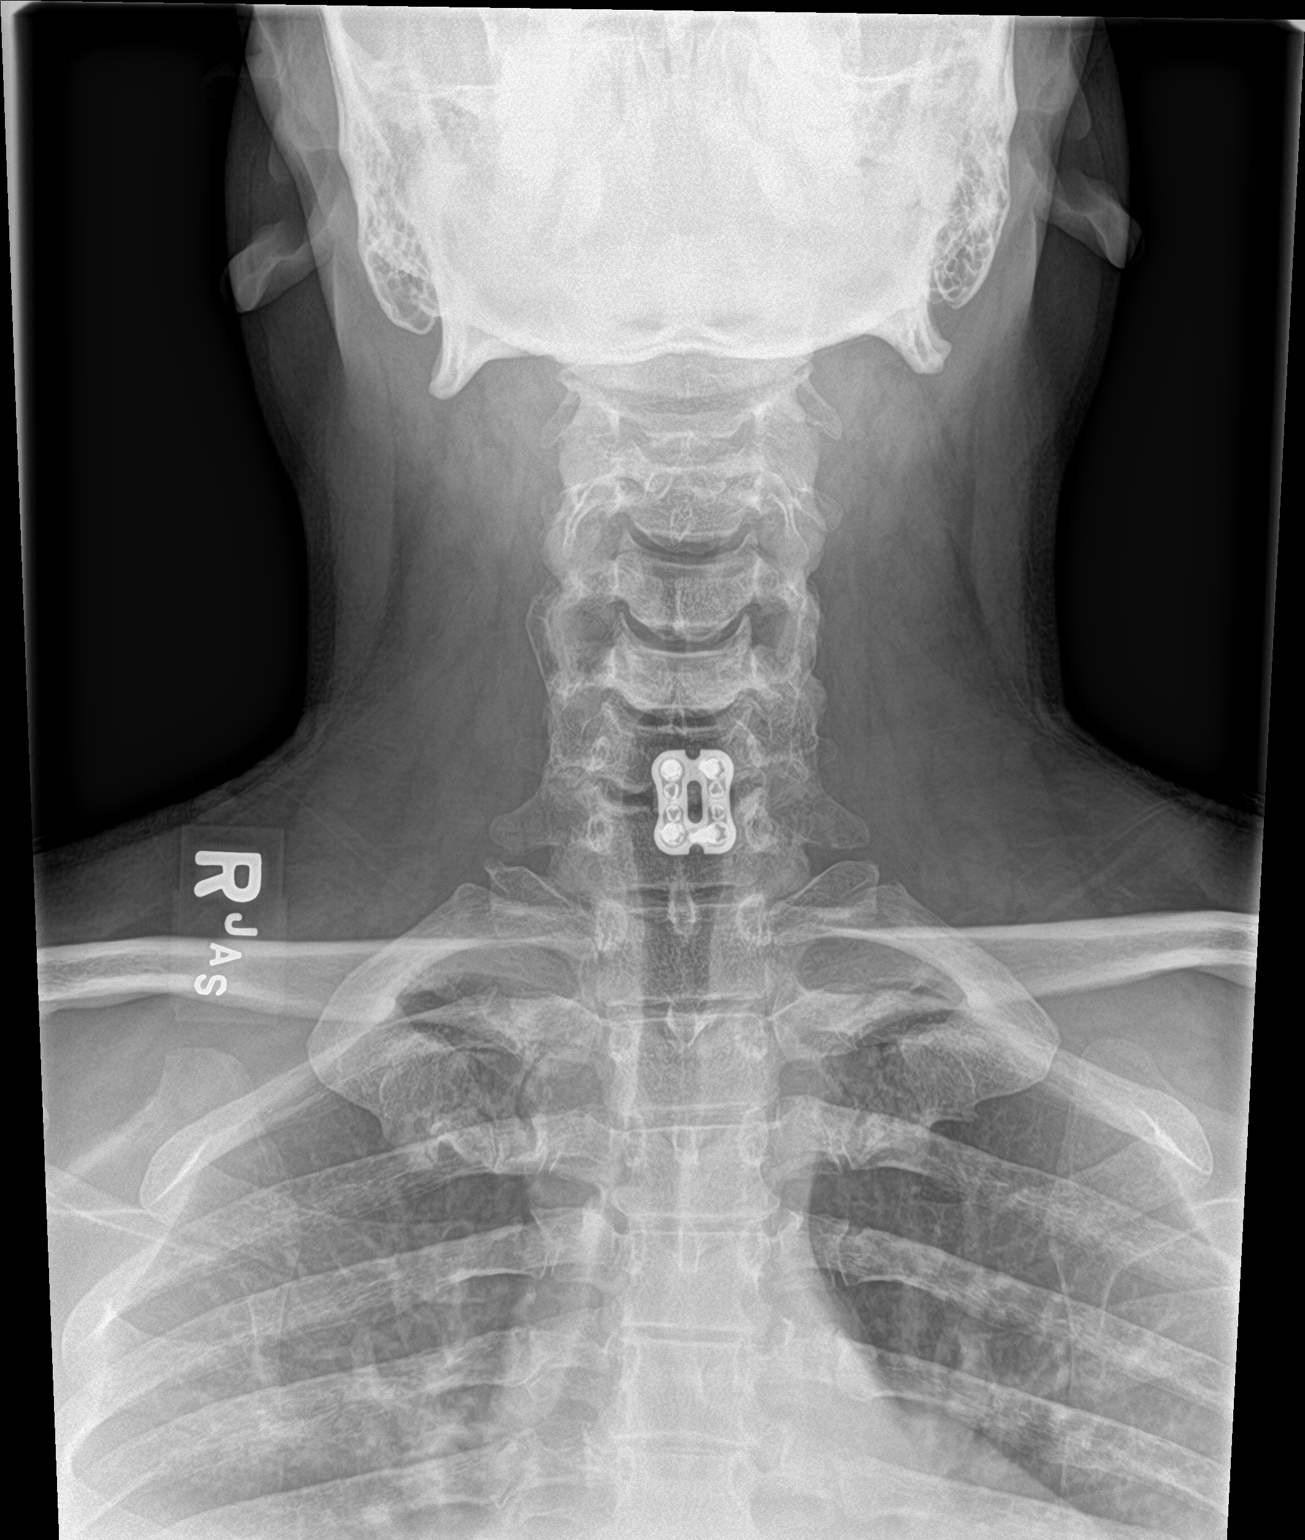

[2 of 2 positions shown; findings below may reference images not displayed]

FINDINGS: The prevertebral soft tissues appear normal. There is no thickening
of the epiglottis. No airway foreign bodies are identified. Cervical
spine findings are dictated separately.
IMPRESSION: No abnormality of the neck soft tissues demonstrated.

## 2018-10-21 IMAGING — DX DG CERVICAL SPINE COMPLETE 4+V
6 series · 6 of 6 positions shown · non-contrast
Comparison: Cervical spine radiographs 10/30/2017.

CLINICAL DATA: Choking sensation while riding in a car. Anterior
neck pain. History of cervical fusion 8 months ago.

EXAM:
CERVICAL SPINE - COMPLETE 4+ VIEW

[c-spine lat]
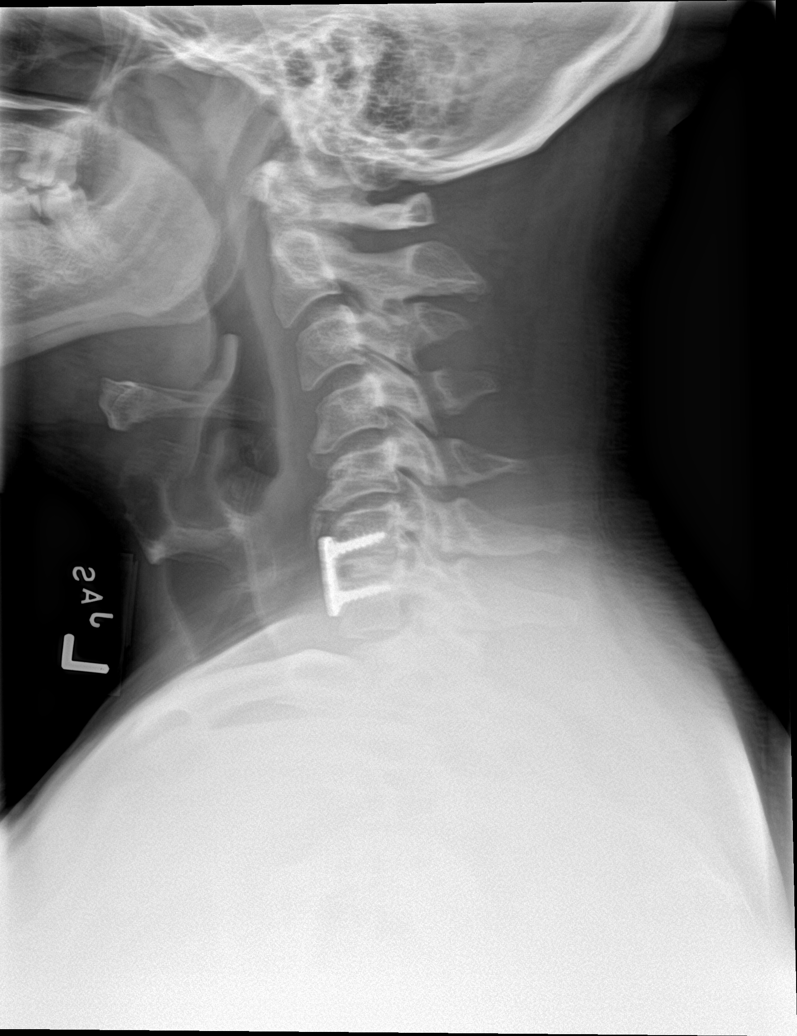

[c-spine obl (1 of 2)]
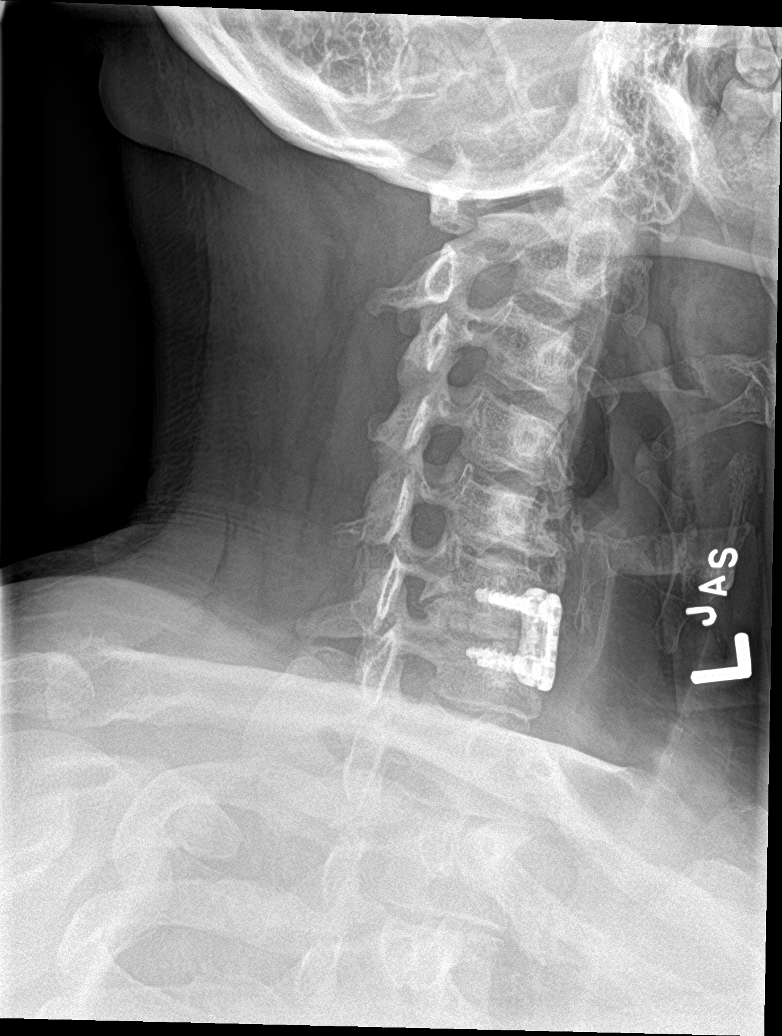

[c-spine obl (2 of 2)]
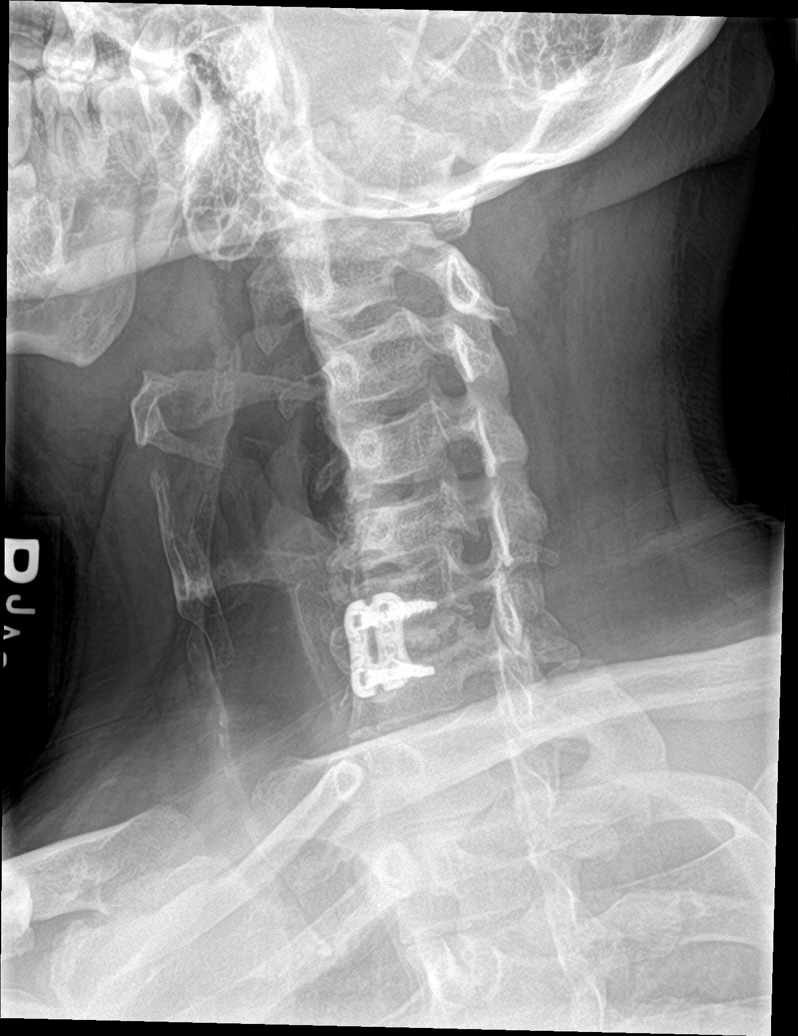

[c-spine ap]
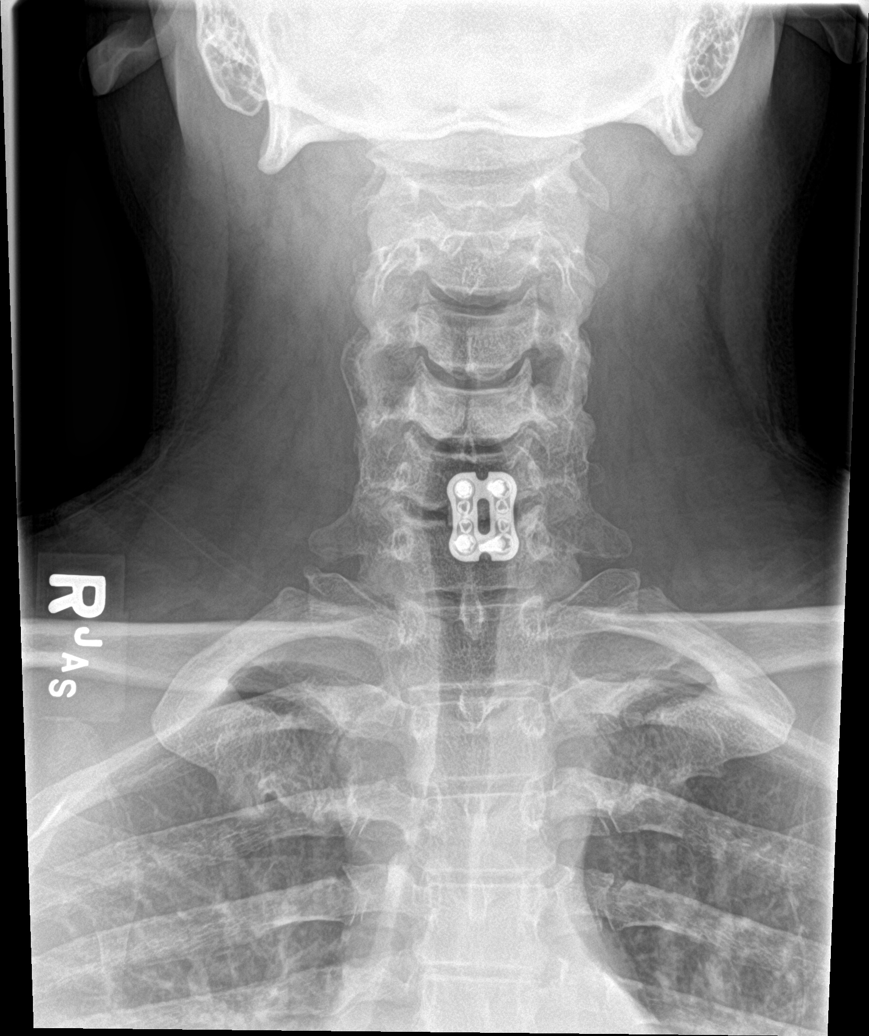

[c-spine open mouth]
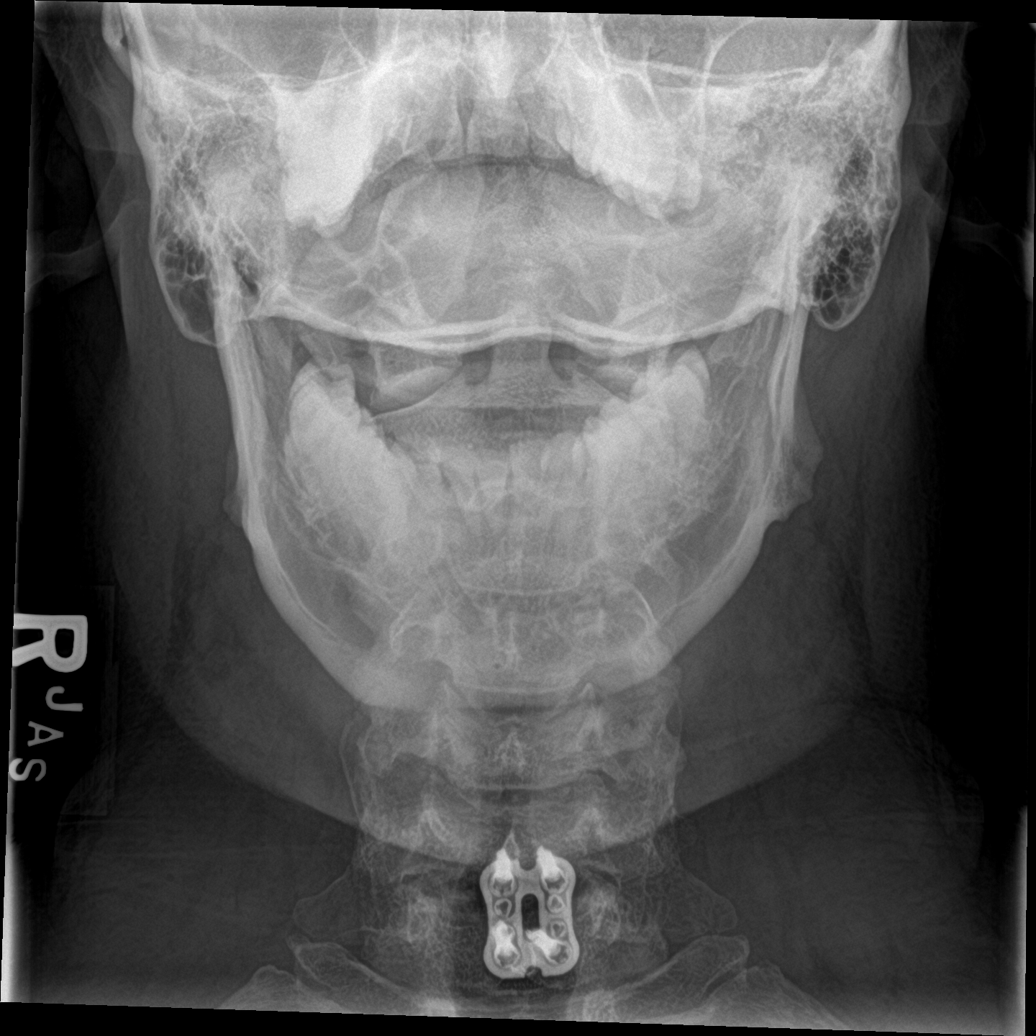

[c-spine swimmers]
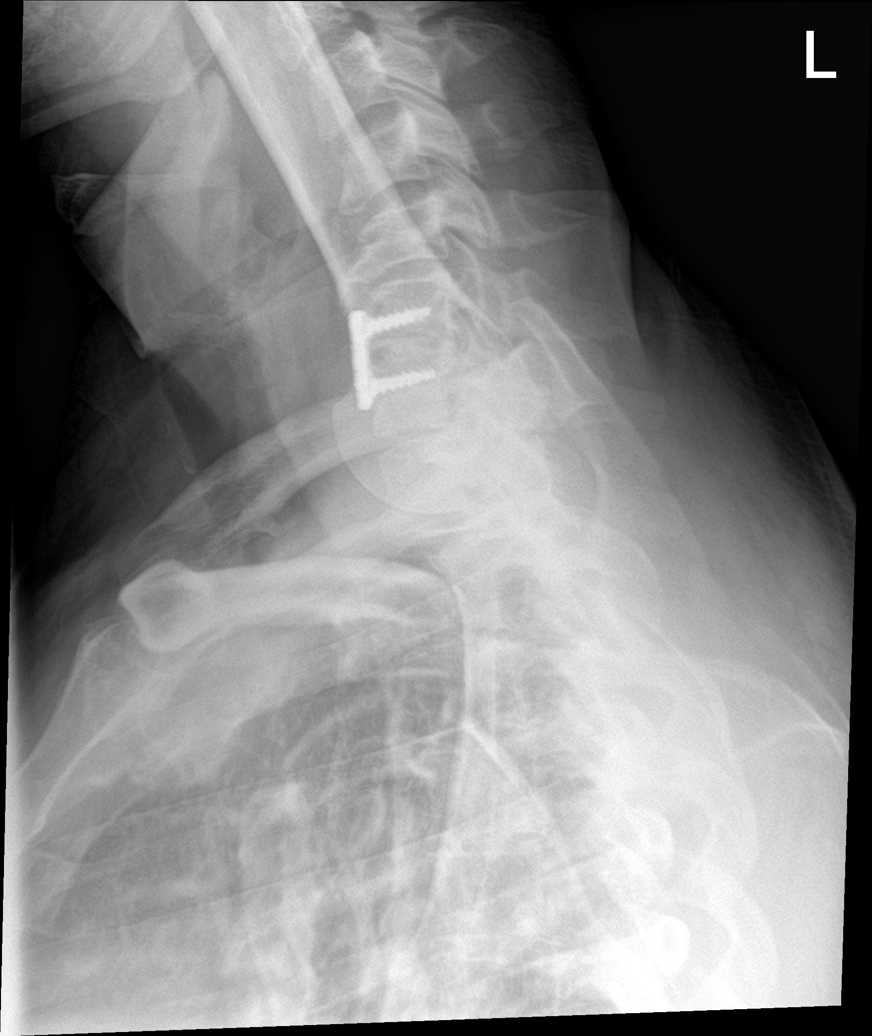

[6 of 6 positions shown; findings below may reference images not displayed]

FINDINGS: Reversal of cervical lordosis without focal angulation or listhesis.
The prevertebral soft tissues are normal. Status post anterior
discectomy and fusion at C6-7. Anterior plate and screws are intact.
Interbody fusion appears solid. Oblique views demonstrate osseous
foraminal narrowing, right-greater-than-left at the operative level.
No acute osseous findings are seen.
IMPRESSION: Intact hardware and stable alignment status post C6-7 ACDF. No acute
findings demonstrated.

## 2018-10-25 ENCOUNTER — Encounter: Payer: Self-pay | Admitting: Family Medicine

## 2018-10-25 ENCOUNTER — Ambulatory Visit (INDEPENDENT_AMBULATORY_CARE_PROVIDER_SITE_OTHER): Payer: Self-pay | Admitting: Family Medicine

## 2018-10-25 VITALS — BP 114/84 | HR 90 | Temp 98.0°F | Ht 71.0 in | Wt 248.0 lb

## 2018-10-25 DIAGNOSIS — I10 Essential (primary) hypertension: Secondary | ICD-10-CM

## 2018-10-25 DIAGNOSIS — Z131 Encounter for screening for diabetes mellitus: Secondary | ICD-10-CM

## 2018-10-25 DIAGNOSIS — Z09 Encounter for follow-up examination after completed treatment for conditions other than malignant neoplasm: Secondary | ICD-10-CM

## 2018-10-25 DIAGNOSIS — F419 Anxiety disorder, unspecified: Secondary | ICD-10-CM

## 2018-10-25 LAB — POCT URINALYSIS DIP (MANUAL ENTRY)
Bilirubin, UA: NEGATIVE
Blood, UA: NEGATIVE
Glucose, UA: NEGATIVE mg/dL
Ketones, POC UA: NEGATIVE mg/dL
Leukocytes, UA: NEGATIVE
Nitrite, UA: NEGATIVE
Protein Ur, POC: NEGATIVE mg/dL
Spec Grav, UA: 1.02 (ref 1.010–1.025)
Urobilinogen, UA: 1 E.U./dL
pH, UA: 6.5 (ref 5.0–8.0)

## 2018-10-25 LAB — POCT GLYCOSYLATED HEMOGLOBIN (HGB A1C): Hemoglobin A1C: 5.3 % (ref 4.0–5.6)

## 2018-10-25 NOTE — Progress Notes (Signed)
Established Patient Office Visit  Subjective:  Patient ID: Vincent Davis, male    DOB: 03-26-1976  Age: 43 y.o. MRN: [id number]  CC:  Chief Complaint  Patient presents with  . Follow-up    Chronic condtion     HPI Vincent Davis is a 43 year old male who  presents for follow up.   Past Medical History:  Diagnosis Date  . 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 Cervical spondylosis with radiculopathy   . Complication of anesthesia    no previous surgery  . Family history of adverse reaction to anesthesia    "I don't know."  . Obesity (BMI 30.0-34.9)   . Pneumonia    as a child   Current Status: Since his last office visit, he is doing well with no complaints. He is currently seeing a Psychiatrist as needed. He is doing much better. His anxiety is mild today. He denies suicidal ideations, homicidal ideations, or auditory hallucinations.   He denies fevers, chills, fatigue, recent infections, weight loss, and night sweats. He has not had any headaches, visual changes, dizziness, and falls. No chest pain, heart palpitations, cough and shortness of breath reported. No reports of GI problems such as nausea, vomiting, diarrhea, and constipation. He has no reports of blood in stools, dysuria and hematuria. No depression or anxiety, and He denies pain today.   Past Surgical History:  Procedure Laterality Date  . ANTERIOR CERVICAL DECOMP/DISCECTOMY FUSION N/A 09/03/2017   Procedure: CERVICAL SIX-SEVEN  ANTERIOR CERVICAL DISCECTOMY AND FUSION, ALLOGRAFT, PLATE;  Surgeon: Eldred Manges, MD;  Location: [facility] OR;  Service: Orthopedics;  Laterality: N/A;  . EVACUATION OF CERVICAL HEMATOMA N/A 09/07/2017   Procedure: EVACUATION OF NECK HEMATOMA;  Surgeon: Eldred Manges, MD;  Location: [facility] OR;  Service: Orthopedics;  Laterality: N/A;  . NO PAST SURGERIES      Family History  Problem Relation Age of Onset  . Other Mother        Healthy  . Other Father        Healthy     Social History   Socioeconomic History  . Marital status: Single    Spouse name: Not on file  . Number of children: Not on file  . Years of education: Not on file  . Highest education level: Not on file  Occupational History  . Not on file  Social Needs  . Financial resource strain: Not on file  . Food insecurity:    Worry: Not on file    Inability: Not on file  . Transportation needs:    Medical: Not on file    Non-medical: Not on file  Tobacco Use  . Smoking status: Former Smoker    Types: Cigarettes  . Smokeless tobacco: Never Used  Substance and Sexual Activity  . Alcohol use: Yes    Comment: occasional  . Drug use: Yes    Types: Marijuana    Comment: last use 08/29/17  . Sexual activity: Not on file  Lifestyle  . Physical activity:    Days per week: Not on file    Minutes per session: Not on file  . Stress: Not on file  Relationships  . Social connections:    Talks on phone: Not on file    Gets together: Not on file    Attends religious service: Not on file    Active member of club or organization: Not on file    Attends meetings of clubs or  organizations: Not on file    Relationship status: Not on file  . Intimate partner violence:    Fear of current or ex partner: Not on file    Emotionally abused: Not on file    Physically abused: Not on file    Forced sexual activity: Not on file  Other Topics Concern  . Not on file  Social History Narrative  . Not on file    Outpatient Medications Prior to Visit  Medication Sig Dispense Refill  . albuterol (PROVENTIL HFA;VENTOLIN HFA) 108 (90 Base) MCG/ACT inhaler Inhale 2 puffs into the lungs every 6 (six) hours as needed for wheezing or shortness of breath. 1 Inhaler 12  . escitalopram (LEXAPRO) 20 MG tablet Take 1 tablet (20 mg total) by mouth daily. 30 tablet 1  . meloxicam (MOBIC) 15 MG tablet Take 1 tablet (15 mg total) by mouth daily. 30 tablet 3  . traMADol (ULTRAM) 50 MG tablet Take 1 tablet (50 mg  total) by mouth every 8 (eight) hours as needed. 30 tablet 0  . traZODone (DESYREL) 100 MG tablet Take 1 tablet (100 mg total) by mouth at bedtime as needed for sleep. 30 tablet 2  . aspirin 81 MG chewable tablet Chew 1 tablet (81 mg total) by mouth daily. (Patient not taking: Reported on 03/25/2018) 30 tablet 0  . methocarbamol (ROBAXIN) 500 MG tablet Take 1 tablet (500 mg total) by mouth 2 (two) times daily. (Patient not taking: Reported on 08/13/2018) 10 tablet 0   No facility-administered medications prior to visit.     No Known Allergies  ROS Review of Systems  Constitutional: Negative.   HENT: Negative.   Eyes: Negative.   Respiratory: Negative.   Cardiovascular: Negative.   Gastrointestinal: Negative.   Endocrine: Negative.   Genitourinary: Negative.   Musculoskeletal: Negative.   Skin: Negative.   Allergic/Immunologic: Negative.   Neurological: Negative.   Hematological: Negative.   Psychiatric/Behavioral: Negative.    Objective:    Physical Exam  Constitutional: He is oriented to person, place, and time. He appears well-developed and well-nourished.  HENT:  Head: Normocephalic and atraumatic.  Eyes: Conjunctivae are normal.  Neck: Normal range of motion. Neck supple.  Pulmonary/Chest: Effort normal and breath sounds normal.  Abdominal: Soft. Bowel sounds are normal.  Musculoskeletal: Normal range of motion.  Neurological: He is alert and oriented to person, place, and time.  Skin: Skin is warm and dry.  Psychiatric: He has a normal mood and affect. His behavior is normal. Judgment and thought content normal.  Nursing note and vitals reviewed.   BP 114/84 (BP Location: Right Arm, Patient Position: Sitting, Cuff Size: Large)   Pulse 90   Temp 98 F (36.7 C) (Oral)   Ht 5\' 11"  (1.803 m)   Wt 248 lb (112.5 kg)   SpO2 99%   BMI 34.59 kg/m  Wt Readings from Last 3 Encounters:  10/25/18 248 lb (112.5 kg)  08/13/18 245 lb (111.1 kg)  08/01/18 250 lb (113.4 kg)      There are no preventive care reminders to display for this patient.  There are no preventive care reminders to display for this patient.  Lab Results  Component Value Date   TSH 1.320 04/15/2018   Lab Results  Component Value Date   WBC 10.7 04/15/2018   HGB 14.6 04/15/2018   HCT 45.5 04/15/2018   MCV 92 04/15/2018   PLT 343 04/15/2018   Lab Results  Component Value Date   NA 136 04/15/2018  K 4.7 04/15/2018   CO2 23 04/15/2018   GLUCOSE 127 (H) 04/15/2018   BUN 8 04/15/2018   CREATININE 1.05 04/15/2018   BILITOT 0.4 04/15/2018   ALKPHOS 47 04/15/2018   AST 24 04/15/2018   ALT 31 04/15/2018   PROT 7.8 04/15/2018   ALBUMIN 4.9 04/15/2018   CALCIUM 9.7 04/15/2018   ANIONGAP 9 09/16/2017   Lab Results  Component Value Date   CHOL 181 04/15/2018   Lab Results  Component Value Date   HDL 63 04/15/2018   Lab Results  Component Value Date   LDLCALC 92 04/15/2018   Lab Results  Component Value Date   TRIG 130 04/15/2018   Lab Results  Component Value Date   CHOLHDL 2.9 04/15/2018   Lab Results  Component Value Date   HGBA1C 5.3 10/25/2018   Assessment & Plan:   1. Essential hypertension Blood pressure is stable at 114/84 today. He will continue to decrease high sodium intake, excessive alcohol intake, increase potassium intake, smoking cessation, and increase physical activity of at least 30 minutes of cardio activity daily. She will continue to follow Heart Healthy or DASH diet.  2. Anxiety He will continue Lexapro as prescribed by Psychiatrist. He will continue follow ups.   3. Screening for diabetes mellitus Hgb A1c is normal at 5.3 today.  He will continue to decrease foods/beverages high in sugars and carbs and follow Heart Healthy or DASH diet. Increase physical activity to at least 30 minutes cardio exercise daily.  - POCT urinalysis dipstick - POCT glycosylated hemoglobin (Hb A1C)  4. Follow up He will follow up in 6 months. We will  draw labs at next office visit.   No orders of the defined types were placed in this encounter.  Raliegh IpNatalie Ryan Palermo,  MSN, FNP-C [facility] [address]  [place], [place] [id number] [phone]    Problem List Items Addressed This Visit    None    Visit Diagnoses    Essential hypertension    -  Primary   Anxiety       Screening for diabetes mellitus       Relevant Orders   POCT urinalysis dipstick (Completed)   POCT glycosylated hemoglobin (Hb A1C) (Completed)   Follow up          No orders of the defined types were placed in this encounter.   Follow-up: Return in about 6 months (around 04/25/2019).    Kallie LocksNatalie M Zaine Elsass, FNP

## 2018-10-30 ENCOUNTER — Ambulatory Visit (INDEPENDENT_AMBULATORY_CARE_PROVIDER_SITE_OTHER): Payer: Self-pay | Admitting: Licensed Clinical Social Worker

## 2018-10-30 DIAGNOSIS — F4310 Post-traumatic stress disorder, unspecified: Secondary | ICD-10-CM

## 2018-10-30 NOTE — Progress Notes (Signed)
   THERAPIST PROGRESS NOTE  Session Time: 4pm-4:45pm  Participation Level: Active  Behavioral Response: CasualAlertDepressed and Irritable; flat affect  Type of Therapy: Individual Therapy  Treatment Goals addressed: Coping  Interventions: CBT and Motivational Interviewing  Summary: Vincent Davis is a 43 y.o. male who presents with symptoms of depression and PTSD. Client reports with taking extra trazadone he is able to fall asleep but not stay asleep. Client denies attempting relaxation skills after waking to go back to sleep, does report listening to music as a distraction. Client reports attempting to cook once since last session, reporting he has been sick and continuing to isolate. Client reports living situation is a primary stressors and demonstrates limited insight  Related to relationship problems. Client was not receptive to identifying his part in any incident. Client identifies overeating sweets at night and gaining weight. Client reports he was considering checking himself into [facility] to 'get away' from current living situation. Client was somewhate receptive to tracking thoughts and feelings related to late night snacking.   Suicidal/Homicidal: Nowithout intent/plan; intermittent passive SI with history of overtaking medications  Therapist Response: Clinician checked in with client, assessing for SI/HI/psychosis. Clinician discussed with client resources for crisis support, including [facility] and [facility]. Client was provided with contact information for both resources. Clinician reminded client of the CBT triangle and connection of thoughts, feelings, and behaviors. Clinician encouraged client to track thoughts and feelings related to snacking. Clinician explained being able to identify 'stuck points' in order to address ruminating thoughts. Clinician challenged client on accepting responsibility for his own behaviors, including managing expectations.  Plan: Return again in  3 weeks.  Diagnosis: Axis I: Depressive Disorder NOS and Post Traumatic Stress Disorder      [name], LCSW 10/30/2018

## 2018-11-01 ENCOUNTER — Ambulatory Visit (HOSPITAL_COMMUNITY): Payer: Self-pay | Admitting: Psychiatry

## 2018-11-01 MED FILL — ESCITALOPRAM 20 MG TABLET: 20 | 30 days supply | Qty: 30 | Fill #0

## 2018-11-03 ENCOUNTER — Other Ambulatory Visit: Payer: Self-pay

## 2018-11-03 ENCOUNTER — Encounter (HOSPITAL_COMMUNITY): Payer: Self-pay

## 2018-11-03 ENCOUNTER — Emergency Department (HOSPITAL_COMMUNITY)
Admission: EM | Admit: 2018-11-03 | Discharge: 2018-11-04 | Disposition: A | Discharge status: Home / Self Care | Payer: No Typology Code available for payment source | Attending: Emergency Medicine | Admitting: Emergency Medicine

## 2018-11-03 DIAGNOSIS — F14180 Cocaine abuse with cocaine-induced anxiety disorder: Secondary | ICD-10-CM | POA: Insufficient documentation

## 2018-11-03 DIAGNOSIS — Z79899 Other long term (current) drug therapy: Secondary | ICD-10-CM | POA: Insufficient documentation

## 2018-11-03 DIAGNOSIS — F4325 Adjustment disorder with mixed disturbance of emotions and conduct: Secondary | ICD-10-CM | POA: Insufficient documentation

## 2018-11-03 DIAGNOSIS — F4310 Post-traumatic stress disorder, unspecified: Secondary | ICD-10-CM | POA: Diagnosis present

## 2018-11-03 DIAGNOSIS — Z87891 Personal history of nicotine dependence: Secondary | ICD-10-CM | POA: Insufficient documentation

## 2018-11-03 LAB — ETHANOL: ALCOHOL ETHYL (B): 227 mg/dL — AB (ref <10)

## 2018-11-03 LAB — ACETAMINOPHEN LEVEL: Acetaminophen (Tylenol), Serum: <10 ug/mL — ABNORMAL LOW (ref 10–30)

## 2018-11-03 LAB — RAPID URINE DRUG SCREEN, HOSP PERFORMED
Amphetamines: NOT DETECTED
Barbiturates: NOT DETECTED
Benzodiazepines: NOT DETECTED
Cocaine: POSITIVE — AB
OPIATES: NOT DETECTED
Tetrahydrocannabinol: NOT DETECTED

## 2018-11-03 LAB — CBC
HCT: 52.6 % — ABNORMAL HIGH (ref 39.0–52.0)
Hemoglobin: 15.9 g/dL (ref 13.0–17.0)
MCH: 28 pg (ref 26.0–34.0)
MCHC: 30.2 g/dL (ref 30.0–36.0)
MCV: 92.6 fL (ref 80.0–100.0)
PLATELETS: 403 10*3/uL — AB (ref 150–400)
RBC: 5.68 MIL/uL (ref 4.22–5.81)
RDW: 13.2 % (ref 11.5–15.5)
WBC: 11.3 10*3/uL — ABNORMAL HIGH (ref 4.0–10.5)
nRBC: 0 % (ref 0.0–0.2)

## 2018-11-03 LAB — COMPREHENSIVE METABOLIC PANEL
ALK PHOS: 46 U/L (ref 38–126)
ALT: 28 U/L (ref 0–44)
ANION GAP: 14 (ref 5–15)
AST: 25 U/L (ref 15–41)
Albumin: 4.7 g/dL (ref 3.5–5.0)
BUN: 9 mg/dL (ref 6–20)
CALCIUM: 9 mg/dL (ref 8.9–10.3)
CO2: 20 mmol/L — AB (ref 22–32)
CREATININE: 1.14 mg/dL (ref 0.61–1.24)
Chloride: 104 mmol/L (ref 98–111)
Glucose, Bld: 113 mg/dL — ABNORMAL HIGH (ref 70–99)
Potassium: 3.8 mmol/L (ref 3.5–5.1)
SODIUM: 138 mmol/L (ref 135–145)
Total Bilirubin: 0.6 mg/dL (ref 0.3–1.2)
Total Protein: 8.3 g/dL — ABNORMAL HIGH (ref 6.5–8.1)

## 2018-11-03 LAB — SALICYLATE LEVEL

## 2018-11-03 NOTE — ED Triage Notes (Signed)
Pt arrived via EMS from home. Pt is escorted with [employer] at this time. Pt had argument with SO, and sustained a fall. Pt reports that he took some pills, and a pint of vodka. Pt reports that he has ha SI/HI, and hallucination. Pt has a plan on how to harm. Pt in [employer] custody, and is not voluntary and was placed in soft restraints. White powder substance in baggies was fnd in pt socks. Pt was refusing to change into wine colored scrubs.     RR 20, CBG 115, HR 100

## 2018-11-03 NOTE — ED Notes (Signed)
Pt is aggitated that he is here, sts " I dont even know why I am here"  Pt called male friend, who brought him chicken wings. Pt not happy that he isnt allowed to have them here.  Sts he was told he could have them here.  Friend took food to car.  Pt talking to TTS.

## 2018-11-03 NOTE — ED Notes (Addendum)
Pt. In burgundy scrubs. Pt. And belongings searched and wanded by security. Pt. Has 1 white tank top, 1 black cell phone and 1red/blue striped pants and 1 red underwear. Pt. Has no wallet and no money. Pt. Has 1 belongings bag. Pt. Has 1 white tank top, 1 red/white pant, 1 black cell phone. Pt. Belongings locked up in [address].

## 2018-11-03 NOTE — ED Notes (Signed)
EKG given to EDP,Mesner,MD., for review. 

## 2018-11-03 NOTE — ED Provider Notes (Signed)
[facility]-EMERGENCY DEPT Provider Note   CSN: [id number] Arrival date & time: 11/03/18  1734     History   Chief Complaint Chief Complaint  Patient presents with  . Suicidal  . Homicidal    HPI Vincent DyerSamuel Keith Davis is a 43 y.o. male.  Patient is here with [employer] secondary to suicidal ideation.  Patient apparently had a altercation with his family and then had a fall and may have syncopized.  EMS was called and police arrived first and patient made a suicidal remarks but also was acting delusional and paranoid.  Has been drinking today of unclear amount.  Patient stated to them that he would do something that would make them kill him and that he made some homicidal remarks as well.  At this time he just is very perseverating on past medical history and having a surgery that caused a blood clot and states that this caused everything happened today.  Please state that they found multiple small baggies with white powder in his socks but he states he cannot possibly be his as he did not have socks on.     Past Medical History:  Diagnosis Date  . 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 Cervical spondylosis with radiculopathy   . Complication of anesthesia    no previous surgery  . Family history of adverse reaction to anesthesia    "I don't know."  . Obesity (BMI 30.0-34.9)   . Pneumonia    as a child    Patient Active Problem List   Diagnosis Date Noted  . Oropharyngeal dysphagia   . Acute renal failure (HCC) 09/07/2017  . Pulmonary edema, acute (HCC)   . Acute hypercapnic respiratory failure (HCC)   . Cardiac arrest due to respiratory disorder (HCC)   . 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 Alcohol use 05/10/2017    Past Surgical History:  Procedure Laterality Date  . ANTERIOR CERVICAL DECOMP/DISCECTOMY FUSION N/A 09/03/2017   Procedure: CERVICAL SIX-SEVEN  ANTERIOR CERVICAL DISCECTOMY AND FUSION, ALLOGRAFT,  PLATE;  Surgeon: Eldred MangesYates, Mark C, MD;  Location: [facility] OR;  Service: Orthopedics;  Laterality: N/A;  . EVACUATION OF CERVICAL HEMATOMA N/A 09/07/2017   Procedure: EVACUATION OF NECK HEMATOMA;  Surgeon: Eldred MangesYates, Mark C, MD;  Location: [facility] OR;  Service: Orthopedics;  Laterality: N/A;  . NO PAST SURGERIES          Home Medications    Prior to Admission medications   Medication Sig Start Date End Date Taking? Authorizing Provider  escitalopram (LEXAPRO) 20 MG tablet Take 1 tablet (20 mg total) by mouth daily. 09/06/18 09/06/19 Yes Thresa RossAkhtar, Nadeem, MD  levocetirizine (XYZAL) 5 MG tablet Take 5 mg by mouth daily as needed for allergies.   Yes [provider]  meloxicam (MOBIC) 15 MG tablet Take 1 tablet (15 mg total) by mouth daily. 08/13/18  Yes Kallie LocksStroud, Natalie M, FNP  traMADol (ULTRAM) 50 MG tablet Take 1 tablet (50 mg total) by mouth every 8 (eight) hours as needed. Patient taking differently: Take 50 mg by mouth every 8 (eight) hours as needed for moderate pain.  05/21/18  Yes Kallie LocksStroud, Natalie M, FNP  traZODone (DESYREL) 100 MG tablet Take 1 tablet (100 mg total) by mouth at bedtime as needed for sleep. 06/19/18  Yes Kallie LocksStroud, Natalie M, FNP  albuterol (PROVENTIL HFA;VENTOLIN HFA) 108 (90 Base) MCG/ACT inhaler Inhale 2 puffs into the lungs every 6 (six) hours as needed for wheezing or shortness  of breath. 08/13/18   Kallie LocksStroud, Natalie M, FNP  aspirin 81 MG chewable tablet Chew 1 tablet (81 mg total) by mouth daily. Patient not taking: Reported on 03/25/2018 09/15/17   Berton Mountgbata, Sylvester I, MD  methocarbamol (ROBAXIN) 500 MG tablet Take 1 tablet (500 mg total) by mouth 2 (two) times daily. Patient not taking: Reported on 08/13/2018 08/01/18   Elizabeth PalauMorelli, Brandon A, PA-C    Family History Family History  Problem Relation Age of Onset  . Other Mother        Healthy  . Other Father        Healthy    Social History Social History   Tobacco Use  . Smoking status: Former Smoker    Types: Cigarettes    . Smokeless tobacco: Never Used  Substance Use Topics  . Alcohol use: Yes    Comment: occasional  . Drug use: Yes    Types: Marijuana    Comment: last use 08/29/17     Allergies   Patient has no known allergies.   Review of Systems Review of Systems  All other systems reviewed and are negative.    Physical Exam Updated Vital Signs BP (!) 143/102 (BP Location: Right Arm)   Pulse 97   Temp 98.4 F (36.9 C) (Oral)   Resp 20   SpO2 98%   Physical Exam Vitals signs and nursing note reviewed.  Constitutional:      Appearance: He is well-developed.  HENT:     Head: Normocephalic and atraumatic.     Mouth/Throat:     Mouth: Mucous membranes are moist.  Eyes:     Extraocular Movements: Extraocular movements intact.     Conjunctiva/sclera: Conjunctivae normal.  Neck:     Musculoskeletal: Normal range of motion.  Cardiovascular:     Rate and Rhythm: Normal rate.  Pulmonary:     Effort: Pulmonary effort is normal. No respiratory distress.  Abdominal:     General: There is no distension.  Musculoskeletal: Normal range of motion.  Skin:    General: Skin is warm and dry.  Neurological:     General: No focal deficit present.     Mental Status: He is alert.      ED Treatments / Results  Labs (all labs ordered are listed, but only abnormal results are displayed) Labs Reviewed  COMPREHENSIVE METABOLIC PANEL - Abnormal; Notable for the following components:      Result Value   CO2 20 (*)    Glucose, Bld 113 (*)    Total Protein 8.3 (*)    All other components within normal limits  ETHANOL - Abnormal; Notable for the following components:   Alcohol, Ethyl (B) 227 (*)    All other components within normal limits  ACETAMINOPHEN LEVEL - Abnormal; Notable for the following components:   Acetaminophen (Tylenol), Serum <10 (*)    All other components within normal limits  CBC - Abnormal; Notable for the following components:   WBC 11.3 (*)    HCT 52.6 (*)     Platelets 403 (*)    All other components within normal limits  RAPID URINE DRUG SCREEN, HOSP PERFORMED - Abnormal; Notable for the following components:   Cocaine POSITIVE (*)    All other components within normal limits  SALICYLATE LEVEL    EKG None  Radiology No results found.  Procedures Procedures (including critical care time)  Medications Ordered in ED Medications - No data to display   Initial Impression / Assessment and Plan /  ED Course  I have reviewed the triage vital signs and the nursing notes.  Pertinent labs & imaging results that were available during my care of the patient were reviewed by me and considered in my medical decision making (see chart for details).   Syncope workup then psych eval.   More sober and now evading questions. TTS consulted and will reval in AM.. they state he needs IVC"ed if he tries to leave, would recommend reevaluating patient and deciding.    Final Clinical Impressions(s) / ED Diagnoses   Final diagnoses:  None    ED Discharge Orders    None       Jousha Schwandt, Barbara Cower, MD 11/04/18 0025
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
## 2018-11-03 NOTE — ED Notes (Signed)
Pt took shower, friend Marcelino Duster at bedside. Pt is fixated on wanting chicken wings.  Pt given second [place] sandwich and drink.

## 2018-11-03 NOTE — Progress Notes (Signed)
Dr. Clayborne Dana informed of pt disposition and recommendation of IVC if pt refuses observation. Nurse, Toniann Fail informed of pt disposition.

## 2018-11-03 NOTE — ED Notes (Signed)
Bed: AQ76 Expected date:  Expected time:  Means of arrival:  Comments: Ems

## 2018-11-03 NOTE — ED Notes (Signed)
Attempted lab draw x 2 but unsuccessful. RN,Celeste made aware.

## 2018-11-04 ENCOUNTER — Encounter (HOSPITAL_COMMUNITY): Payer: Self-pay | Admitting: Psychiatry

## 2018-11-04 ENCOUNTER — Other Ambulatory Visit: Payer: Self-pay

## 2018-11-04 ENCOUNTER — Ambulatory Visit (INDEPENDENT_AMBULATORY_CARE_PROVIDER_SITE_OTHER): Payer: Self-pay | Admitting: Psychiatry

## 2018-11-04 VITALS — BP 136/80 | HR 80 | Ht 71.0 in | Wt 241.0 lb

## 2018-11-04 DIAGNOSIS — F4310 Post-traumatic stress disorder, unspecified: Secondary | ICD-10-CM

## 2018-11-04 DIAGNOSIS — F331 Major depressive disorder, recurrent, moderate: Secondary | ICD-10-CM

## 2018-11-04 MED ORDER — TRAZODONE HCL 100 MG PO TABS
100.0000 mg | ORAL_TABLET | Freq: Every evening | ORAL | Status: DC | PRN
  Administered 2018-11-04: 100 mg via ORAL
  Filled 2018-11-04: qty 1

## 2018-11-04 MED ORDER — ESCITALOPRAM OXALATE 20 MG PO TABS: 30.0000 mg | ORAL_TABLET | Freq: Every day | ORAL | 1 refills | Status: DC

## 2018-11-04 MED FILL — traZODone HCL 100 MG TABS: 100 | 30 days supply | Qty: 30 | Fill #1

## 2018-11-04 NOTE — BH Assessment (Signed)
[facility] Assessment Progress Note  Per Juanetta BeetsJacqueline Norman, DO, this pt does not require psychiatric hospitalization at this time.  [facility] with recommendation to follow up with Thresa RossNadeem Akhtar, MD, pt's regular outpatient provider [facility] [place].  Pt has an appointment scheduled for today, 11/04/2018 at 13:30.  This has been included in pt's discharge instructions.  Pt's nurse, Kendal Hymendie, has been notified.  Doylene Canninghomas Shaindy Reader, MA Triage Specialist [phone]

## 2018-11-04 NOTE — ED Notes (Addendum)
Per Pieter Partridge, TTS, recommend keeping pt overnight for eval due to SI statements made at pts home.  Also she sts if patient insists on leaving she recommends IVC by EDP for continued evaluation overnight

## 2018-11-04 NOTE — ED Notes (Signed)
Pt asleep, snoring after trazadone.  Pt stated ' it wont work, it wont work" prior to taking it.

## 2018-11-04 NOTE — ED Notes (Signed)
Bed: [facility] Expected date:  Expected time:  Means of arrival:  Comments: Hold for 26

## 2018-11-04 NOTE — ED Notes (Signed)
Pt speaking to TTS 

## 2018-11-04 NOTE — Consult Note (Addendum)
[facility] Psych ED Discharge  11/04/2018 11:34 AM [name]  MRN:  [id number] Principal Problem: PTSD (post-traumatic stress disorder) Discharge Diagnoses: Principal Problem:   PTSD (post-traumatic stress disorder)  Subjective:Per TTS Vincent Davis is an 43 y.o., single male. Pt presented to [facility] via [facility] voluntarily and unaccompanied. Pt reports that he does not remember many of the events surrounding his admission. Pt reports that he remembers a conflict with his significant other, then he says that he walked outside to get air and EMS met him outside. Pt reports that his significant other stated that he fell, and he is assuming that his significant other called EMS. Pt reports that he had drank a beer and a couple shots of alcohol earlier in the day. Pt denied Cocaine use, despite being positive on UDS, but did report Marijuana use, which he is not positive for. Pt reports that he did not resist police or assistance from EMS. Pt denied SI/HI current and past. Pt reports depression symptoms including loss of interest, despondence, irritability and loss of interest. Pt stated, "Since my surgery and flat-lining, I have not been the same. My life has not been the same." Pt reports hx of trauma in December 2018 surrounding a surgery. Per pt report, following surgery his heart stopped due to a Hematoma and he spend a significant amount of time intubated and in ICU. Pt reports continued hypervigilance toward medical procedures, hospitals and medical staff. Pt reported current heightened anxiety. Pt denied AH/VH. Pt was evasive about alcohol use and other MH symptoms. Pt reports being a patient of a psychiatrist in [place]. Pt reports having a therapist at [facility].    Pt reports living with his significant other. Pt reports being unemployed and having no insurance. Pt reports being single. Pt denied access to weapons. Pt denied legal hx and probation.    Per exam today: Pt states "he was upset after  drinking and said some things he should not have said, he does not want to hurt himself or anyone else".  He was drinking and partying because it was the [employer].  Denies drinking or using drugs on a regular basis.  He states he sees a therapist and a psychiatrist on a regular basis and does take his medication.  He would like to go home and feels safe. Pt is pleasant, calm with good eye contact. He is a good historian and denies suicidal ideation.  Denies auditory or visual hallucinations.  He gives permission to obtain collateral information from his girlfriend Vincent Davis, who had no safety concerns.  Alcohol consumption increases with social events and decreases with medications and therapy, drinking worse on the weekends and holidays.  No withdrawal symptoms.  Collateral information: Silvio Pate [phone] called.  She states she has no concerns that patient will harm himself or anyone else.   She doesn't drink a lot and is comfortable taking him home. She says his short term memory is an issue since his surgery last year where he was coded for 20 minutes and was in ICU for 3 months.     Total Time spent with patient: 45 minutes  Past Psychiatric History: Depression  Past Medical History:  Past Medical History:  Diagnosis Date  . 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 Cervical spondylosis with radiculopathy   . Complication of anesthesia    no previous surgery  . Family history of adverse reaction to anesthesia    "I don't know."  .  Obesity (BMI 30.0-34.9)   . Pneumonia    as a child    Past Surgical History:  Procedure Laterality Date  . ANTERIOR CERVICAL DECOMP/DISCECTOMY FUSION N/A 09/03/2017   Procedure: CERVICAL SIX-SEVEN  ANTERIOR CERVICAL DISCECTOMY AND FUSION, ALLOGRAFT, PLATE;  Surgeon: Marybelle Killings, MD;  Location: [place];  Service: Orthopedics;  Laterality: N/A;  . EVACUATION OF CERVICAL HEMATOMA N/A 09/07/2017   Procedure: EVACUATION OF NECK HEMATOMA;   Surgeon: Marybelle Killings, MD;  Location: [place];  Service: Orthopedics;  Laterality: N/A;  . NO PAST SURGERIES     Family History:  Family History  Problem Relation Age of Onset  . Other Mother        Healthy  . Other Father        Healthy   Family Psychiatric  History: None per chart review.  Social History:  Social History   Substance and Sexual Activity  Alcohol Use Yes   Comment: occasional     Social History   Substance and Sexual Activity  Drug Use Yes  . Types: Marijuana   Comment: last use 08/29/17    Social History   Socioeconomic History  . Marital status: Single    Spouse name: Not on file  . Number of children: Not on file  . Years of education: Not on file  . Highest education level: Not on file  Occupational History  . Not on file  Social Needs  . Financial resource strain: Not on file  . Food insecurity:    Worry: Not on file    Inability: Not on file  . Transportation needs:    Medical: Not on file    Non-medical: Not on file  Tobacco Use  . Smoking status: Former Smoker    Types: Cigarettes  . Smokeless tobacco: Never Used  Substance and Sexual Activity  . Alcohol use: Yes    Comment: occasional  . Drug use: Yes    Types: Marijuana    Comment: last use 08/29/17  . Sexual activity: Not on file  Lifestyle  . Physical activity:    Days per week: Not on file    Minutes per session: Not on file  . Stress: Not on file  Relationships  . Social connections:    Talks on phone: Not on file    Gets together: Not on file    Attends religious service: Not on file    Active member of club or organization: Not on file    Attends meetings of clubs or organizations: Not on file    Relationship status: Not on file  Other Topics Concern  . Not on file  Social History Narrative  . Not on file    Has this patient used any form of tobacco in the last 30 days? (Cigarettes, Smokeless Tobacco, Cigars, and/or Pipes) Prescription not provided because:  Patient does not use tobacco products.  Current Medications: Current Facility-Administered Medications  Medication Dose Route Frequency Provider Last Rate Last Dose  . traZODone (DESYREL) tablet 100 mg  100 mg Oral QHS PRN Larene Pickett, PA-C   100 mg at 11/04/18 2426   Current Outpatient Medications  Medication Sig Dispense Refill  . escitalopram (LEXAPRO) 20 MG tablet Take 1 tablet (20 mg total) by mouth daily. 30 tablet 1  . levocetirizine (XYZAL) 5 MG tablet Take 5 mg by mouth daily as needed for allergies.    . meloxicam (MOBIC) 15 MG tablet Take 1 tablet (15 mg total) by mouth  daily. 30 tablet 3  . traMADol (ULTRAM) 50 MG tablet Take 1 tablet (50 mg total) by mouth every 8 (eight) hours as needed. (Patient taking differently: Take 50 mg by mouth every 8 (eight) hours as needed for moderate pain. ) 30 tablet 0  . traZODone (DESYREL) 100 MG tablet Take 1 tablet (100 mg total) by mouth at bedtime as needed for sleep. 30 tablet 2  . albuterol (PROVENTIL HFA;VENTOLIN HFA) 108 (90 Base) MCG/ACT inhaler Inhale 2 puffs into the lungs every 6 (six) hours as needed for wheezing or shortness of breath. 1 Inhaler 12  . aspirin 81 MG chewable tablet Chew 1 tablet (81 mg total) by mouth daily. (Patient not taking: Reported on 03/25/2018) 30 tablet 0  . methocarbamol (ROBAXIN) 500 MG tablet Take 1 tablet (500 mg total) by mouth 2 (two) times daily. (Patient not taking: Reported on 08/13/2018) 10 tablet 0   PTA Medications: (Not in a hospital admission)   Musculoskeletal: Strength & Muscle Tone: within normal limits Gait & Station: normal Patient leans: N/A  Psychiatric Specialty Exam: Physical Exam  Nursing note and vitals reviewed. Constitutional: He is oriented to person, place, and time. He appears well-developed and well-nourished.  HENT:  Head: Normocephalic.  Neck: Normal range of motion.  Respiratory: Effort normal.  Musculoskeletal: Normal range of motion.  Neurological: He is  alert and oriented to person, place, and time.  Psychiatric: His speech is normal and behavior is normal. Thought content normal. His mood appears anxious. Cognition and memory are normal.    Review of Systems  Psychiatric/Behavioral: Positive for substance abuse. The patient is nervous/anxious.   All other systems reviewed and are negative.   Blood pressure 116/64, pulse 62, temperature 98.5 F (36.9 C), temperature source Oral, resp. rate 16, SpO2 98 %.There is no height or weight on file to calculate BMI.  General Appearance: Casual  Eye Contact:  Good  Speech:  Clear and Coherent and Normal Rate  Volume:  Normal  Mood:  Anxious  Affect:  Congruent  Thought Process:  Coherent and Linear  Orientation:  Full (Time, Place, and Person)  Thought Content:  Logical  Suicidal Thoughts:  No  Homicidal Thoughts:  No  Memory:  Immediate;   Good Recent;   Fair Remote;   Good  Judgement:  Intact  Insight:  Good  Psychomotor Activity:  Negative  Concentration:  Concentration: Good and Attention Span: Good  Recall:  Good  Fund of Knowledge:  Good  Language:  Good  Akathisia:  Negative  Handed:  Right  AIMS (if indicated):   N/A  Assets:  Communication Skills Housing Social Support Transportation  ADL's:  Intact  Cognition:  WNL  Sleep:   N/A     Demographic Factors:  Male and Unemployed  Loss Factors: NA  Historical Factors: NA  Risk Reduction Factors:   Living with another person, especially a relative, Positive social support, Positive therapeutic relationship and Positive coping skills or problem solving skills  Continued Clinical Symptoms:    Cognitive Features That Contribute To Risk:  None    Suicide Risk:  Minimal: No identifiable suicidal ideation.  Patients presenting with no risk factors but with morbid ruminations; may be classified as minimal risk based on the severity of the depressive symptoms   Plan Of Care/Follow-up recommendations:  Alcohol abuse  with alcohol induced mood disorder: -Started [facility] alcohol detox protocol -Continued Lexapro 30 mg daily  Insomnia: -Continued Trazodone 100 mg at bedtime as needed.  Activity:  as tolerated Diet:  Heart healthy diet Other:  Follow up appointment today with psychologist at 1330.  Will make psychiatric appointment  Disposition: Stable for discharge Waylan Boga, NP 11/04/2018, 11:34 AM   Patient seen face-to-face for psychiatric evaluation, chart reviewed and case discussed with the physician extender and developed treatment plan. Reviewed the information documented and agree with the treatment plan.  Buford Dresser, DO 11/04/18 4:24 PM
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
## 2018-11-04 NOTE — ED Notes (Signed)
Pt awake, c/o dry eyes. He fell asleep with his contacts in. Pt given some saline to moisten contacts.

## 2018-11-04 NOTE — ED Notes (Signed)
Pt discharged safely with girlfriend.  Pt was in no distress.  Denied S/I and H/I.

## 2018-11-04 NOTE — Discharge Instructions (Signed)
For your behavioral health needs, you are advised to continue treatment with Vincent RossNadeem Akhtar, MD at the [facility] [place].  You have an appointment scheduled for today, Monday, November 04, 2018 at 1:30 pm:       [facility]      [address]      [place], [place] [address]      [phone]

## 2018-11-04 NOTE — ED Notes (Signed)
Pt oriented to room and unit.  Pt is calm and cooperative but irritable.  Pt denies S/I, H/I, and AVH.  15 minute checks and video monitoring in place.

## 2018-11-04 NOTE — ED Notes (Addendum)
Charted in error - restraints.  This patient not in restraints. Pt calm and cooperative. Fixated on eating and wanting to leave to get chicken wings

## 2018-11-13 ENCOUNTER — Ambulatory Visit (INDEPENDENT_AMBULATORY_CARE_PROVIDER_SITE_OTHER): Payer: Self-pay | Admitting: Diagnostic Neuroimaging

## 2018-11-13 ENCOUNTER — Encounter: Payer: Self-pay | Admitting: Diagnostic Neuroimaging

## 2018-11-13 VITALS — BP 116/68 | HR 87 | Ht 71.0 in | Wt 240.0 lb

## 2018-11-13 DIAGNOSIS — M79602 Pain in left arm: Secondary | ICD-10-CM

## 2018-11-13 DIAGNOSIS — R413 Other amnesia: Secondary | ICD-10-CM
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
## 2018-11-20 ENCOUNTER — Other Ambulatory Visit: Payer: Self-pay | Admitting: Family Medicine

## 2018-11-20 DIAGNOSIS — M4722 Other spondylosis with radiculopathy, cervical region: Secondary | ICD-10-CM

## 2018-11-20 DIAGNOSIS — R200 Anesthesia of skin: Secondary | ICD-10-CM

## 2018-11-20 DIAGNOSIS — M79602 Pain in left arm: Secondary | ICD-10-CM

## 2018-11-20 DIAGNOSIS — R202 Paresthesia of skin: Secondary | ICD-10-CM

## 2018-11-22 ENCOUNTER — Encounter: Payer: Self-pay | Admitting: Occupational Therapy

## 2018-11-22 ENCOUNTER — Ambulatory Visit: Payer: Self-pay | Attending: Family Medicine | Admitting: Physical Therapy

## 2018-11-22 ENCOUNTER — Encounter: Payer: Self-pay | Admitting: Physical Therapy

## 2018-11-22 ENCOUNTER — Other Ambulatory Visit: Payer: Self-pay

## 2018-11-22 ENCOUNTER — Ambulatory Visit: Payer: Self-pay | Admitting: Occupational Therapy

## 2018-11-22 DIAGNOSIS — M25612 Stiffness of left shoulder, not elsewhere classified: Secondary | ICD-10-CM | POA: Insufficient documentation

## 2018-11-22 DIAGNOSIS — R278 Other lack of coordination: Secondary | ICD-10-CM

## 2018-11-22 DIAGNOSIS — M6281 Muscle weakness (generalized): Secondary | ICD-10-CM

## 2018-11-22 DIAGNOSIS — R262 Difficulty in walking, not elsewhere classified: Secondary | ICD-10-CM

## 2018-11-22 DIAGNOSIS — M25622 Stiffness of left elbow, not elsewhere classified: Secondary | ICD-10-CM

## 2018-11-22 DIAGNOSIS — R41840 Attention and concentration deficit: Secondary | ICD-10-CM | POA: Insufficient documentation

## 2018-11-22 DIAGNOSIS — M79602 Pain in left arm: Secondary | ICD-10-CM | POA: Insufficient documentation

## 2018-11-22 DIAGNOSIS — M25632 Stiffness of left wrist, not elsewhere classified: Secondary | ICD-10-CM | POA: Insufficient documentation

## 2018-11-22 DIAGNOSIS — R208 Other disturbances of skin sensation: Secondary | ICD-10-CM

## 2018-11-22 DIAGNOSIS — R2681 Unsteadiness on feet: Secondary | ICD-10-CM
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
## 2018-11-27 ENCOUNTER — Ambulatory Visit (INDEPENDENT_AMBULATORY_CARE_PROVIDER_SITE_OTHER): Payer: No Typology Code available for payment source | Admitting: Licensed Clinical Social Worker

## 2018-11-27 DIAGNOSIS — F4310 Post-traumatic stress disorder, unspecified: Secondary | ICD-10-CM
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
## 2018-12-02 ENCOUNTER — Ambulatory Visit: Payer: Self-pay | Admitting: Occupational Therapy

## 2018-12-03 ENCOUNTER — Ambulatory Visit: Payer: Self-pay | Admitting: Physical Therapy

## 2018-12-05 ENCOUNTER — Ambulatory Visit: Payer: Self-pay | Admitting: Physical Therapy

## 2018-12-06 ENCOUNTER — Encounter: Payer: Self-pay | Admitting: Occupational Therapy

## 2018-12-06 ENCOUNTER — Ambulatory Visit: Payer: Self-pay | Attending: Family Medicine | Admitting: Occupational Therapy

## 2018-12-06 DIAGNOSIS — R278 Other lack of coordination: Secondary | ICD-10-CM | POA: Insufficient documentation

## 2018-12-06 DIAGNOSIS — M79602 Pain in left arm: Secondary | ICD-10-CM | POA: Insufficient documentation

## 2018-12-06 DIAGNOSIS — M25632 Stiffness of left wrist, not elsewhere classified: Secondary | ICD-10-CM | POA: Insufficient documentation

## 2018-12-06 DIAGNOSIS — R208 Other disturbances of skin sensation: Secondary | ICD-10-CM | POA: Insufficient documentation

## 2018-12-06 DIAGNOSIS — R41840 Attention and concentration deficit: Secondary | ICD-10-CM | POA: Insufficient documentation

## 2018-12-06 DIAGNOSIS — M25612 Stiffness of left shoulder, not elsewhere classified: Secondary | ICD-10-CM | POA: Insufficient documentation

## 2018-12-06 DIAGNOSIS — R2681 Unsteadiness on feet: Secondary | ICD-10-CM | POA: Insufficient documentation

## 2018-12-06 DIAGNOSIS — M6281 Muscle weakness (generalized): Secondary | ICD-10-CM | POA: Insufficient documentation

## 2018-12-06 DIAGNOSIS — M25622 Stiffness of left elbow, not elsewhere classified: Secondary | ICD-10-CM | POA: Insufficient documentation

## 2018-12-06 NOTE — Therapy (Signed)
[facility] [address] [place], [place], [address] Phone: [phone]   Fax:  [phone]  Occupational Therapy Treatment  Patient Details  Name: Vincent Davis MRN: [id number] Date of Birth: 05/06/1976 Referring Provider (OT): Dr Bradly Chris   Encounter Date: 12/06/2018  OT End of Session - 12/06/18 1607    Visit Number  2    Number of Visits  17    Authorization Type  CAFA      Authorization Time Period  through 3/26 per verification note 11/22/18    OT Start Time  1452    OT Stop Time  1530    OT Time Calculation (min)  38 min    Activity Tolerance  Other (comment)   anxiety   Behavior During Therapy  Anxious       Past Medical History:  Diagnosis Date  . 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 Cervical spondylosis with radiculopathy   . Complication of anesthesia    no previous surgery  . Family history of adverse reaction to anesthesia    "I don't know."  . Obesity (BMI 30.0-34.9)   . Pneumonia    as a child  . PTSD (post-traumatic stress disorder)     Past Surgical History:  Procedure Laterality Date  . ANTERIOR CERVICAL DECOMP/DISCECTOMY FUSION N/A 09/03/2017   Procedure: CERVICAL SIX-SEVEN  ANTERIOR CERVICAL DISCECTOMY AND FUSION, ALLOGRAFT, PLATE;  Surgeon: Eldred Manges, MD;  Location: [facility] OR;  Service: Orthopedics;  Laterality: N/A;  . EVACUATION OF CERVICAL HEMATOMA N/A 09/07/2017   Procedure: EVACUATION OF NECK HEMATOMA;  Surgeon: Eldred Manges, MD;  Location: [facility] OR;  Service: Orthopedics;  Laterality: N/A;  . NO PAST SURGERIES      There were no vitals filed for this visit.  Subjective Assessment - 12/06/18 1504    Subjective   I have a hard time being here    Pertinent History  ACDF- Difficult recovery- hypoxia    Currently in Pain?  Yes    Pain Score  4     Pain Location  Arm    Pain Orientation  Left    Pain Descriptors / Indicators  Throbbing    Pain Type  Chronic  pain;Neuropathic pain    Pain Onset  More than a month ago    Pain Frequency  Constant    Aggravating Factors   movement    Pain Relieving Factors  medicine    Effect of Pain on Daily Activities  affects sleep                   OT Treatments/Exercises (OP) - 12/06/18 0001      ADLs   ADL Comments  Reviewed short and long term goals.  Patient expressing concern about his ability to return to more independent living, stating - "It's been so long since I could do that"  Patient did agree that he wanted to try to work in rehab.        Neurological Re-education Exercises   Other Exercises 1  Worked on active relaxation upper chest, neck, upper quadrant.  Patient with active tension throughout.  Worked through deep breathing tohelp expand chest and begin gentle rib cage mobility.  Patient unable to lie flat, but tolerated supine on wedge.  Patient very anxious about receiving therapy in this setting, but wanting to try.  Patient tolerated minimal upper quadrant active movement - modified contract relax upper quadrant, glenohumeral joint, and  elbow.  Patient with limited bicep length - setting off spasm at end of passive elbow extension.               OT Education - 12/06/18 1606    Education Details  Reviewed short and long term goals, established pain targets - no more than increase by 3 points - notification with increase by two points, movement delivered slowly    Person(s) Educated  Patient    Methods  Explanation;Demonstration    Comprehension  Verbalized understanding;Need further instruction       OT Short Term Goals - 12/06/18 1506      OT SHORT TERM GOAL #1   Title  Patient will complete an HEP designed for self range of motion LUE DUE 12/27/18    Time  4    Period  Weeks    Status  On-going      OT SHORT TERM GOAL #2   Title  Patient will put on a pull over type shirt without physical assistance    Period  Weeks    Status  On-going      OT SHORT TERM GOAL  #3   Title  Patient will wash lower legs and feet without physical assistance    Time  4    Period  Weeks    Status  On-going      OT SHORT TERM GOAL #4   Title  Patient will complete a HEP designed to improve coordiantion in LUE     Time  4    Period  Weeks    Status  On-going      OT SHORT TERM GOAL #5   Title  Patient will dress his lower body with no more than min assist    Time  4    Period  Weeks    Status  New        OT Long Term Goals - 12/06/18 1508      OT LONG TERM GOAL #1   Title  Patient will complete his own bathing, dressing and grooming with modified independence due 01/24/19  (Pended)     Time  8  (Pended)     Period  Weeks  (Pended)     Status  On-going  (Pended)       OT LONG TERM GOAL #2   Title  Patient will prepare a meal for two consistencing of at least three items with intermittent cueing, and without physical assistance  (Pended)     Time  8  (Pended)     Period  Weeks  (Pended)     Status  On-going  (Pended)       OT LONG TERM GOAL #3   Title  Patient will report pain no greater than 2/10 when utilizing LUE as non dominant hand to complete bathing, dressing tasks.    (Pended)     Time  8  (Pended)     Period  Weeks  (Pended)     Status  On-going  (Pended)       OT LONG TERM GOAL #4   Title  Patient will complete updated HEP to address functional strngth and mobility in LUE  (Pended)     Period  Weeks  (Pended)     Status  On-going  (Pended)       OT LONG TERM GOAL #5   Title  Patient will demonstrate sufficient strength and range of motion in LUE to complete mid level reach -  chest height - to obtain and place a 2 lb object.    (Pended)     Time  8  (Pended)     Period  Weeks  (Pended)     Status  On-going  (Pended)             Plan - 12/06/18 1607    Clinical Impression Statement  Patient limited by significant anxiety, muscle tension, and nerve pain.  Patient with habitual movement patterns that are contributing to pain and  disuse of LUE.      Occupational Profile and client history currently impacting functional performance  Boyfriend, chef, friend    Occupational performance deficits (Please refer to evaluation for details):  ADL's;IADL's;Rest and Sleep;Work;Leisure;Social Participation    Rehab Potential  Fair    Clinical Decision Making  Several treatment options, min-mod task modification necessary    OT Frequency  2x / week    OT Duration  8 weeks    OT Treatment/Interventions  Self-care/ADL training;Electrical Stimulation;Iontophoresis;Therapeutic exercise;Visual/perceptual remediation/compensation;Coping strategies training;Aquatic Therapy;Moist Heat;Paraffin;Neuromuscular education;Compression bandaging;Splinting;Patient/family education;Fluidtherapy;Building services engineer;Therapeutic activities;Balance training;Contrast Bath;Ultrasound;DME and/or AE instruction;Manual Therapy;Passive range of motion;Cognitive remediation/compensation;Psychosocial skills training;Cryotherapy    Plan  Gentle mobility in rib cage, upper quadrant - work toward increased passive motion shoulder, elbow, forearm left.  Active relaxation - slow pace    Consulted and Agree with Plan of Care  Patient       Patient will benefit from skilled therapeutic intervention in order to improve the following deficits and impairments:     Visit Diagnosis: Pain in left arm  Other disturbances of skin sensation  Muscle weakness (generalized)  Other lack of coordination  Stiffness of left shoulder, not elsewhere classified  Stiffness of left elbow, not elsewhere classified  Stiffness of left wrist, not elsewhere classified  Attention and concentration deficit  Unsteadiness on feet    Problem List Patient Active Problem List   Diagnosis Date Noted  . PTSD (post-traumatic stress disorder) 11/04/2018  . Oropharyngeal dysphagia   . Acute renal failure (HCC) 09/07/2017  . Pulmonary edema, acute (HCC)   . Acute hypercapnic  respiratory failure (HCC)   . Cardiac arrest due to respiratory disorder (HCC)   . 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 Alcohol use 05/10/2017    Collier Salina, OTR/L 12/06/2018, 4:13 PM  [facility] [address] [place], [place], [address] Phone: [phone]   Fax:  [phone]  Name: Vincent Davis MRN: [id number] Date of Birth: 07/05/1976

## 2018-12-09 ENCOUNTER — Ambulatory Visit: Payer: Self-pay | Admitting: Physical Therapy

## 2018-12-10 ENCOUNTER — Ambulatory Visit: Payer: Self-pay | Admitting: Physical Therapy

## 2018-12-11 ENCOUNTER — Ambulatory Visit: Payer: Self-pay | Admitting: Physical Therapy

## 2018-12-11 ENCOUNTER — Ambulatory Visit: Payer: Self-pay | Admitting: Occupational Therapy

## 2018-12-11 ENCOUNTER — Telehealth: Payer: Self-pay

## 2018-12-11 DIAGNOSIS — G4700 Insomnia, unspecified: Secondary | ICD-10-CM

## 2018-12-11 DIAGNOSIS — R0602 Shortness of breath: Secondary | ICD-10-CM

## 2018-12-11 DIAGNOSIS — F419 Anxiety disorder, unspecified: Secondary | ICD-10-CM

## 2018-12-11 DIAGNOSIS — M545 Low back pain, unspecified: Secondary | ICD-10-CM

## 2018-12-11 DIAGNOSIS — M79602 Pain in left arm: Secondary | ICD-10-CM

## 2018-12-11 MED ORDER — MELOXICAM 15 MG PO TABS: 15.0000 mg | ORAL_TABLET | Freq: Every day | ORAL | 3 refills | Status: DC

## 2018-12-11 MED ORDER — ALBUTEROL SULFATE HFA 108 (90 BASE) MCG/ACT IN AERS: 2.0000 | INHALATION_SPRAY | Freq: Four times a day (QID) | RESPIRATORY_TRACT | 12 refills | Status: DC | PRN

## 2018-12-11 MED ORDER — TRAZODONE HCL 100 MG PO TABS: 100.0000 mg | ORAL_TABLET | Freq: Every evening | ORAL | 2 refills | Status: DC | PRN

## 2018-12-11 NOTE — Telephone Encounter (Signed)
Patient needs a refill on Tramadol. 

## 2018-12-11 NOTE — Telephone Encounter (Signed)
Medication sent to pharmacy  

## 2018-12-12 ENCOUNTER — Other Ambulatory Visit: Payer: Self-pay | Admitting: Family Medicine

## 2018-12-12 DIAGNOSIS — M545 Low back pain, unspecified: Secondary | ICD-10-CM

## 2018-12-12 DIAGNOSIS — M79602 Pain in left arm: Secondary | ICD-10-CM

## 2018-12-12 MED ORDER — TRAMADOL HCL 50 MG PO TABS: 50.0000 mg | ORAL_TABLET | Freq: Four times a day (QID) | ORAL | 0 refills | Status: DC | PRN

## 2018-12-12 NOTE — Telephone Encounter (Signed)
Patient notified g

## 2018-12-16 ENCOUNTER — Ambulatory Visit: Payer: Self-pay | Admitting: Physical Therapy

## 2018-12-16 ENCOUNTER — Ambulatory Visit: Payer: Self-pay | Admitting: Occupational Therapy

## 2018-12-16 ENCOUNTER — Telehealth (HOSPITAL_COMMUNITY): Payer: Self-pay | Admitting: Psychiatry

## 2018-12-16 NOTE — Telephone Encounter (Signed)
Pt would like dr Gilmore Laroche to write a letter stating he is unable to work due to his condition so he can get food stamps. He states his PCP wrote this letter the last time.   Please advise.  CB # [phone]

## 2018-12-17 ENCOUNTER — Encounter (HOSPITAL_COMMUNITY): Payer: Self-pay | Admitting: Psychiatry

## 2018-12-17 ENCOUNTER — Ambulatory Visit (HOSPITAL_COMMUNITY): Payer: Self-pay | Admitting: Licensed Clinical Social Worker

## 2018-12-17 NOTE — Telephone Encounter (Signed)
Pt is aware letter has been wrote and placed in out going mail. Nothing further needed at this time.

## 2018-12-17 NOTE — Telephone Encounter (Signed)
Ok will review

## 2018-12-18 ENCOUNTER — Ambulatory Visit: Payer: Self-pay | Admitting: Physical Therapy

## 2018-12-18 ENCOUNTER — Ambulatory Visit: Payer: Self-pay | Admitting: Occupational Therapy

## 2018-12-23 ENCOUNTER — Ambulatory Visit: Payer: Self-pay | Admitting: Physical Therapy

## 2018-12-23 ENCOUNTER — Encounter: Payer: Self-pay | Admitting: Occupational Therapy

## 2018-12-24 ENCOUNTER — Telehealth: Payer: Self-pay | Admitting: Physical Therapy

## 2018-12-24 NOTE — Telephone Encounter (Signed)
Vincent Davis was contacted today regarding the temporary closing of [facility] due to Covid-19.  Therapist discussed:  Possible re-open date and rescheduling cancelled visits and possible E-visits in the future. Pt did not have any questions or concerns for therapist.  Patient IS interested in further information for an e-visit, virtual check in, or telehealth visit, if those services become available.    [facility] will follow up with patients when we are able to resume care.  Dierdre Highman, PT, DPT 12/24/18    10:59 AM    [facility] [address] [place], [place]  [address] Phone:  [phone] Fax:  [phone]

## 2018-12-25 ENCOUNTER — Encounter: Payer: Self-pay | Admitting: Occupational Therapy

## 2018-12-25 ENCOUNTER — Ambulatory Visit: Payer: Self-pay | Admitting: Physical Therapy

## 2018-12-31 ENCOUNTER — Ambulatory Visit (HOSPITAL_COMMUNITY): Payer: Self-pay | Admitting: Licensed Clinical Social Worker

## 2019-01-01 ENCOUNTER — Ambulatory Visit: Payer: Self-pay | Admitting: Physical Therapy

## 2019-01-01 ENCOUNTER — Encounter: Payer: Self-pay | Admitting: Occupational Therapy

## 2019-01-06 ENCOUNTER — Encounter: Payer: Self-pay | Admitting: Occupational Therapy

## 2019-01-06 ENCOUNTER — Ambulatory Visit: Payer: Self-pay | Admitting: Physical Therapy

## 2019-01-07 ENCOUNTER — Telehealth: Payer: Self-pay | Admitting: Physical Therapy

## 2019-01-07 NOTE — Telephone Encounter (Signed)
Vincent Davis was contacted today regarding temporary reduction of [facility] due to concerns for community transmission of COVID-19.  Patient identity was verified.  Assessed if patient needed to be seen in person by clinician (recent fall or acute injury that requires hands on assessment and advice, change in diet order, post-surgical, special cases, etc.).    The patient was offered the continuation of their plan of care by using methods such as an E-Visit, virtual check in, or Telehealth visit but will be unable to participate in Telehealth due to lack of Internet connection.  Patient feels like he needs to be seen in the clinic.  Will attempt to bring pt into the clinic to be seen by PT and OT for one visit to assess current needs and provide pt with education and HEP.  [facility] will follow up with this client to schedule in person appointment.  Dierdre Highman, PT, DPT 01/07/19    4:11 PM

## 2019-01-08 ENCOUNTER — Encounter: Payer: Self-pay | Admitting: Occupational Therapy

## 2019-01-08 ENCOUNTER — Ambulatory Visit: Payer: Self-pay | Admitting: Physical Therapy

## 2019-01-13 ENCOUNTER — Encounter: Payer: Self-pay | Admitting: Occupational Therapy

## 2019-01-13 ENCOUNTER — Ambulatory Visit: Payer: Self-pay | Admitting: Physical Therapy

## 2019-01-14 ENCOUNTER — Other Ambulatory Visit: Payer: Self-pay

## 2019-01-14 ENCOUNTER — Ambulatory Visit (INDEPENDENT_AMBULATORY_CARE_PROVIDER_SITE_OTHER): Payer: Self-pay | Admitting: Licensed Clinical Social Worker

## 2019-01-14 DIAGNOSIS — F4310 Post-traumatic stress disorder, unspecified: Secondary | ICD-10-CM

## 2019-01-15 ENCOUNTER — Ambulatory Visit: Payer: Self-pay | Admitting: Physical Therapy

## 2019-01-15 ENCOUNTER — Encounter: Payer: Self-pay | Admitting: Occupational Therapy

## 2019-01-20 ENCOUNTER — Encounter: Payer: Self-pay | Admitting: Occupational Therapy

## 2019-01-20 NOTE — Progress Notes (Signed)
Virtual Visit via Telephone Note  I connected with Vincent Davis on 01/14/2019 at  4:00 PM EDT by telephone and verified that I am speaking with the correct person using two identifiers.   I discussed the limitations, risks, security and privacy concerns of performing an evaluation and management service by telephone and the availability of in person appointments. I also discussed with the patient that there may be a patient responsible charge related to this service. The patient expressed understanding and agreed to proceed.   I discussed the assessment and treatment plan with the patient. The patient was provided an opportunity to ask questions and all were answered. The patient agreed with the plan and demonstrated an understanding of the instructions.   The patient was advised to call back or seek an in-person evaluation if the symptoms worsen or if the condition fails to improve as anticipated.    THERAPIST PROGRESS NOTE  Session Time: 4pm-4:45pm  Participation Level: Active  Behavioral Response: NAAlertDysphoric  Type of Therapy: Individual Therapy  Treatment Goals addressed: Coping  Interventions: CBT, Motivational Interviewing and Supportive  Summary: Vincent Davis is a 43 y.o. male who presents with PTSD. Client verbalized anxiety related to coronavirus due to history of being hospitalized and having trouble breathing. Client reports he has been outside more focusing on listening to birds which helps keep himself calm. Client reports no change in the relationship with his male roommate. Client verbalized concerns with finances and believes if he could improve finances it would improve his mood. Client does not show progress toward goals verbalizing he does not remember any skill discussed in therapy therefore is not using any skill.  Suicidal/Homicidal: Nowithout intent/plan  Therapist Response: Clinician contacted client via telehealth for phone visit due to  current pandemic and stay at home order. Clinician inquired about changes in stressors and dynamics in the home. Clinician praised client using skill of walking away and using outside to keep focus off ruminating thoughts. Clinician reminded client of previously discussed skills including STOPP, TIPP and 5 senses.  Plan: Return again in 2-3 weeks.  Diagnosis: Axis I: Post Traumatic Stress Disorder    [name], LCSW 01/14/2019

## 2019-01-21 ENCOUNTER — Ambulatory Visit: Payer: Self-pay | Admitting: Physical Therapy

## 2019-01-22 ENCOUNTER — Encounter: Payer: Self-pay | Admitting: Occupational Therapy

## 2019-01-22 ENCOUNTER — Ambulatory Visit: Payer: Self-pay | Admitting: Physical Therapy

## 2019-01-28 NOTE — Telephone Encounter (Signed)
Message sent to provider 

## 2019-01-29 ENCOUNTER — Ambulatory Visit (HOSPITAL_COMMUNITY): Payer: Self-pay | Admitting: Licensed Clinical Social Worker

## 2019-02-12 ENCOUNTER — Ambulatory Visit (HOSPITAL_COMMUNITY): Payer: Self-pay | Admitting: Licensed Clinical Social Worker

## 2019-02-12 ENCOUNTER — Other Ambulatory Visit: Payer: Self-pay

## 2019-02-12 DIAGNOSIS — F4310 Post-traumatic stress disorder, unspecified: Secondary | ICD-10-CM

## 2019-02-17 NOTE — Progress Notes (Signed)
   THERAPIST PROGRESS NOTE  Session Time: 4pm-4:15pm  Participation Level: Minimal  Behavioral Response: NAAlertIrritable  Type of Therapy: Individual Therapy  Treatment Goals addressed: Coping  Interventions: Supportive  Summary: Vincent Davis is a 43 y.o. male who presents with PTSD. Client engaged for a few minutes reporting his phone ws dying and he was not at home to charge it. Client was disconnected and did not call back within scheduled appointment time.   Suicidal/Homicidal: Nowithout intent/plan  Therapist Response: Clinician checked in with client briefly, assessing for SI/HI/psychosis. Clinician provided client the option to call back within scheduled time or be re-scheduled.  Plan: Return again in 2 weeks.  Diagnosis: Axis I: Post Traumatic Stress Disorder      [name], LCSW 02/12/2019

## 2019-02-20 ENCOUNTER — Telehealth (HOSPITAL_COMMUNITY): Payer: Self-pay | Admitting: Licensed Clinical Social Worker

## 2019-02-20 NOTE — Telephone Encounter (Signed)
Clinician attempted to contact client via telephone at 9:56am on 02/20/19 to discuss transitioning to alternate provider in the practice. VM full. Unable to leave message. First attempt to discuss transition to new provider and follow up options unsuccessful. Clinician will continue to follow up.  Fulton Reek, LCSW

## 2019-02-26 ENCOUNTER — Ambulatory Visit (HOSPITAL_COMMUNITY): Payer: Self-pay | Admitting: Licensed Clinical Social Worker

## 2019-03-07 MED FILL — ESCITALOPRAM 20 MG TABLET: 20 | 30 days supply | Qty: 45 | Fill #0

## 2019-03-07 MED FILL — traZODone HCL 100 MG TABS: 100 | 30 days supply | Qty: 30 | Fill #0

## 2019-03-07 MED FILL — !VENTOLIN HFA INHALER: 108 (90 BAS | 25 days supply | Qty: 18 | Fill #0

## 2019-04-24 ENCOUNTER — Ambulatory Visit (INDEPENDENT_AMBULATORY_CARE_PROVIDER_SITE_OTHER): Payer: Self-pay | Admitting: Licensed Clinical Social Worker

## 2019-04-24 DIAGNOSIS — F4310 Post-traumatic stress disorder, unspecified: Secondary | ICD-10-CM

## 2019-04-24 NOTE — Progress Notes (Signed)
Patient ID: Vincent Davis, male   DOB: [birth year], 43 y.o.   MRN: [id number]  Virtual Visit via Telephone Note  I connected with [name] on 04/24/19 at  9:00 AM EDT by telephone and verified that I am speaking with the correct person using two identifiers.  I discussed the limitations, risks, security and privacy concerns of performing an evaluation and management service by telephone and the availability of in person appointments. I also discussed with the patient that there may be a patient responsible charge related to this service. The patient expressed understanding and agreed to proceed.  I discussed the assessment and treatment plan with the patient. The patient was provided an opportunity to ask questions and all were answered. The patient agreed with the plan and demonstrated an understanding of the instructions.   The patient was advised to call back or seek an in-person evaluation if the symptoms worsen or if the condition fails to improve as anticipated.  Type of Therapy: Individual Therapy  Treatment Goals addressed:  Reduce the negative impact the traumatic event has had on many aspects of life and return to pre-trauma level of functioning   Interventions: Motivational Interviewing,  Supportive Counseling  Summary: Vincent Davis is a 43 y.o. male who presents with symptoms of nightmares, intrusive thoughts, depressed mood,   Therapist Response:  Patient met with clinician for an individual session. Patient reports overwhelming anxiety that has not gotten any better since medical trauma. He was noticably stressed in discussing the changes he has experience since the trauma (difficulty with memory, speech changes, difficulty with arm). Counselor engaged patient in discussion about his children, and his mood changed markedly. Counselor challenged patient to identify things about himself that are the same now as they were before the medical trauma. Patient identified that  his sense of humor has remained, though he uses it now to mask his insecurities and what he's going through. Counselor explored with patient how he is using his humor as a coping skill, and guided him to identify people he can take off his mask with. Patient shared that he can talk to his brother and his mother about what he's feeling and going through. Counselor guided patient to explore what "better" would look like for him. Patient would feel more like himself if he were able to cook again, but he cannot focus or remember enough to do so and when he tries he worries all the time about getting hurt and having to go back to the hospital. Counselor and patient processed patient's near-constant fears of being sent back to the hospital and being traumatized again. Patient shares that he rarely leaves the house, because when he does he is overcome with fear about something making him have to go back to the hospital. He has similar fears if he has to go to doctor's appointments, but not as intense. Patient also reports being triggered by [employer] theme song (this was playing on the tv in the hospital) and hearing machines beeping. He shared fears that he may not ever get any better than he is now, but wants to move forward in life as much as possible, and is willing to do what he needs to to move forward.   Suicidal/Homicidal:  No  Recommendation and plan:  Continued sessions every other week  Diagnosis:  PTSD   I provided 54 minutes of non-face-to-face time during this encounter.   Lillie Fragmin, LCSW

## 2019-04-25 ENCOUNTER — Ambulatory Visit (INDEPENDENT_AMBULATORY_CARE_PROVIDER_SITE_OTHER): Payer: Self-pay | Admitting: Family Medicine

## 2019-04-25 ENCOUNTER — Encounter: Payer: Self-pay | Admitting: Family Medicine

## 2019-04-25 ENCOUNTER — Other Ambulatory Visit: Payer: Self-pay

## 2019-04-25 VITALS — BP 138/70 | HR 60 | Temp 98.0°F | Ht 71.0 in | Wt 257.0 lb

## 2019-04-25 DIAGNOSIS — R200 Anesthesia of skin: Secondary | ICD-10-CM

## 2019-04-25 DIAGNOSIS — I10 Essential (primary) hypertension: Secondary | ICD-10-CM

## 2019-04-25 DIAGNOSIS — F4310 Post-traumatic stress disorder, unspecified: Secondary | ICD-10-CM

## 2019-04-25 DIAGNOSIS — R8290 Unspecified abnormal findings in urine: Secondary | ICD-10-CM

## 2019-04-25 DIAGNOSIS — Z09 Encounter for follow-up examination after completed treatment for conditions other than malignant neoplasm: Secondary | ICD-10-CM

## 2019-04-25 DIAGNOSIS — R202 Paresthesia of skin: Secondary | ICD-10-CM

## 2019-04-25 DIAGNOSIS — F419 Anxiety disorder, unspecified: Secondary | ICD-10-CM

## 2019-04-25 LAB — POCT URINALYSIS DIP (MANUAL ENTRY)
Bilirubin, UA: NEGATIVE
Glucose, UA: NEGATIVE mg/dL
Ketones, POC UA: NEGATIVE mg/dL
Leukocytes, UA: NEGATIVE
Nitrite, UA: NEGATIVE
Protein Ur, POC: NEGATIVE mg/dL
Spec Grav, UA: 1.025 (ref 1.010–1.025)
Urobilinogen, UA: 0.2 E.U./dL
pH, UA: 5.5 (ref 5.0–8.0)

## 2019-04-25 NOTE — Progress Notes (Signed)
[facility]    Established Patient Office Visit  Subjective:  Patient ID: Vincent Davis, male    DOB: 08/27/1976  Age: 43 y.o. MRN: [id number]  CC:  Chief Complaint  Patient presents with  . Follow-up    Chronic condition   . Insomnia  . Depression    HPI Vincent Davis is a 43 year old male who presents for Hospital Follow up today.   Past Medical History:  Diagnosis Date  . 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 Cervical spondylosis with radiculopathy   . Complication of anesthesia    no previous surgery  . Family history of adverse reaction to anesthesia    "I don't know."  . Obesity (BMI 30.0-34.9)   . Pneumonia    as a child  . PTSD (post-traumatic stress disorder)    Current Status: Since his last office visit, he has had an ED visit for PTSD on 11/03/2018. Today, he is doing well with no complaints.  His anxiety is mild today. He denies suicidal ideations, homicidal ideations, or auditory hallucinations. He continues to [facility] with [facility]. His last follow up was 04/24/2019. 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 He continues to have left extremity pain. He has continued followed up with Rehab appointments as prescribed. He has not been able to go to rehab because of COVID19.  He denies fevers, chills, fatigue, recent infections, weight loss, and night sweats. He has not had any headaches, visual changes, dizziness, and falls. No chest pain, heart palpitations, cough and shortness of breath reported. No reports of GI problems such as nausea, vomiting, diarrhea, and constipation. He has no reports of blood in stools, dysuria and hematuria. He denies pain today.    Past Surgical History:  Procedure Laterality Date  . ANTERIOR CERVICAL DECOMP/DISCECTOMY FUSION N/A 09/03/2017   Procedure: CERVICAL SIX-SEVEN   ANTERIOR CERVICAL DISCECTOMY AND FUSION, ALLOGRAFT, PLATE;  Surgeon: Eldred MangesYates, Mark C, MD;  Location: [facility] OR;  Service: Orthopedics;  Laterality: N/A;  . EVACUATION OF CERVICAL HEMATOMA N/A 09/07/2017   Procedure: EVACUATION OF NECK HEMATOMA;  Surgeon: Eldred MangesYates, Mark C, MD;  Location: [facility] OR;  Service: Orthopedics;  Laterality: N/A;  . NO PAST SURGERIES      Family History  Problem Relation Age of Onset  . Other Mother        Healthy    Social History   Socioeconomic History  . Marital status: Single    Spouse name: Not on file  . Number of children: 2  . Years of education: some college  . Highest education level: Not on file  Occupational History    Comment: NA  Social Needs  . Financial resource strain: Not on file  . Food insecurity    Worry: Not on file    Inability: Not on file  . Transportation needs    Medical: Not on file    Non-medical: Not on file  Tobacco Use  . Smoking status: Former Smoker    Types: Cigarettes  . Smokeless tobacco: Never Used  Substance and Sexual Activity  . Alcohol use: Yes    Comment: occasional  . Drug use: Yes    Types: Marijuana    Comment: last use 08/29/17  . Sexual activity: Not on file  Lifestyle  . Physical activity    Days per week: Not on  file    Minutes per session: Not on file  . Stress: Not on file  Relationships  . Social Herbalist on phone: Not on file    Gets together: Not on file    Attends religious service: Not on file    Active member of club or organization: Not on file    Attends meetings of clubs or organizations: Not on file    Relationship status: Not on file  . Intimate partner violence    Fear of current or ex partner: Not on file    Emotionally abused: Not on file    Physically abused: Not on file    Forced sexual activity: Not on file  Other Topics Concern  . Not on file  Social History Narrative   Lives with sig other   Caffeine- not daily    Outpatient Medications Prior to Visit   Medication Sig Dispense Refill  . albuterol (PROVENTIL HFA;VENTOLIN HFA) 108 (90 Base) MCG/ACT inhaler Inhale 2 puffs into the lungs every 6 (six) hours as needed for wheezing or shortness of breath. 1 Inhaler 12  . aspirin EC 81 MG tablet Take 81 mg by mouth daily.    Marland Kitchen escitalopram (LEXAPRO) 20 MG tablet Take 1.5 tablets (30 mg total) by mouth daily. 45 tablet 1  . levocetirizine (XYZAL) 5 MG tablet Take 5 mg by mouth daily as needed for allergies.    . meloxicam (MOBIC) 15 MG tablet Take 1 tablet (15 mg total) by mouth daily. 30 tablet 3  . traZODone (DESYREL) 100 MG tablet Take 1 tablet (100 mg total) by mouth at bedtime as needed for sleep. 30 tablet 2  . traMADol (ULTRAM) 50 MG tablet Take 1 tablet (50 mg total) by mouth every 6 (six) hours as needed. 30 tablet 0   No facility-administered medications prior to visit.     No Known Allergies  ROS Review of Systems  Constitutional: Negative.   HENT: Negative.   Eyes: Negative.   Respiratory: Negative.   Cardiovascular: Negative.   Gastrointestinal: Negative.   Endocrine: Negative.   Genitourinary: Negative.   Musculoskeletal: Positive for arthralgias (generalized).  Skin: Negative.   Allergic/Immunologic: Negative.   Neurological: Positive for weakness (left upper extremity).  Hematological: Negative.   Psychiatric/Behavioral: Negative.       Objective:    Physical Exam  Constitutional: He is oriented to person, place, and time. He appears well-developed and well-nourished.  HENT:  Head: Normocephalic and atraumatic.  Eyes: Conjunctivae are normal.  Neck: Normal range of motion. Neck supple.  Cardiovascular: Normal rate, regular rhythm, normal heart sounds and intact distal pulses.  Pulmonary/Chest: Effort normal and breath sounds normal.  Abdominal: Soft. Bowel sounds are normal.  Musculoskeletal: Normal range of motion.  Neurological: He is alert and oriented to person, place, and time. He has normal reflexes.   Skin: Skin is warm and dry.  Psychiatric: He has a normal mood and affect. His behavior is normal. Judgment and thought content normal.  Nursing note and vitals reviewed.   BP 138/70 (BP Location: Right Arm, Patient Position: Sitting, Cuff Size: Large)   Pulse 60   Temp 98 F (36.7 C) (Oral)   Ht 5\' 11"  (1.803 m)   Wt 257 lb (116.6 kg)   SpO2 98%   BMI 35.84 kg/m  Wt Readings from Last 3 Encounters:  04/25/19 257 lb (116.6 kg)  11/13/18 240 lb (108.9 kg)  10/25/18 248 lb (112.5 kg)  There are no preventive care reminders to display for this patient.  There are no preventive care reminders to display for this patient.  Lab Results  Component Value Date   TSH 1.320 04/15/2018   Lab Results  Component Value Date   WBC 11.3 (H) 11/03/2018   HGB 15.9 11/03/2018   HCT 52.6 (H) 11/03/2018   MCV 92.6 11/03/2018   PLT 403 (H) 11/03/2018   Lab Results  Component Value Date   NA 138 11/03/2018   K 3.8 11/03/2018   CO2 20 (L) 11/03/2018   GLUCOSE 113 (H) 11/03/2018   BUN 9 11/03/2018   CREATININE 1.14 11/03/2018   BILITOT 0.6 11/03/2018   ALKPHOS 46 11/03/2018   AST 25 11/03/2018   ALT 28 11/03/2018   PROT 8.3 (H) 11/03/2018   ALBUMIN 4.7 11/03/2018   CALCIUM 9.0 11/03/2018   ANIONGAP 14 11/03/2018   Lab Results  Component Value Date   CHOL 181 04/15/2018   Lab Results  Component Value Date   HDL 63 04/15/2018   Lab Results  Component Value Date   LDLCALC 92 04/15/2018   Lab Results  Component Value Date   TRIG 130 04/15/2018   Lab Results  Component Value Date   CHOLHDL 2.9 04/15/2018   Lab Results  Component Value Date   HGBA1C 5.3 10/25/2018      Assessment & Plan:   1. Essential hypertension The current medical regimen is effective; blood pressure is stable at 138/70 today; continue present plan and medications as prescribed. He will continue to decrease high sodium intake, excessive alcohol intake, increase potassium intake, smoking  cessation, and increase physical activity of at least 30 minutes of cardio activity daily. He will continue to follow Heart Healthy or DASH diet.  2. Numbness and tingling  3. PTSD (post-traumatic stress disorder) Stable today.   4. Anxiety Continue to follow up with Psychiatry as needed.   5. Abnormal urinalysis Results are pending.  - Urine Culture  6. Follow up He will follow up in 3 months.  - POCT urinalysis dipstick  No orders of the defined types were placed in this encounter.   Orders Placed This Encounter  Procedures  . Urine Culture  . POCT urinalysis dipstick    Referral Orders  No referral(s) requested today    Raliegh IpNatalie Karmela Bram,  MSN, FNP-BC [facility] [address]  [place], [place] [id number] [phone] fax    Problem List Items Addressed This Visit      Other   PTSD (post-traumatic stress disorder)    Other Visit Diagnoses    Essential hypertension    -  Primary   Numbness and tingling       Anxiety       Abnormal urinalysis       Relevant Orders   Urine Culture   Follow up       Relevant Orders   POCT urinalysis dipstick (Completed)      No orders of the defined types were placed in this encounter.   Follow-up: Return in about 3 months (around 07/26/2019).    Kallie LocksNatalie M Hansford Hirt, FNP

## 2019-04-27 DIAGNOSIS — R202 Paresthesia of skin: Secondary | ICD-10-CM | POA: Insufficient documentation

## 2019-04-27 DIAGNOSIS — I10 Essential (primary) hypertension: Secondary | ICD-10-CM | POA: Insufficient documentation

## 2019-04-27 DIAGNOSIS — F419 Anxiety disorder, unspecified: Secondary | ICD-10-CM | POA: Insufficient documentation

## 2019-04-27 DIAGNOSIS — R200 Anesthesia of skin: Secondary | ICD-10-CM | POA: Insufficient documentation

## 2019-05-01 ENCOUNTER — Ambulatory Visit (INDEPENDENT_AMBULATORY_CARE_PROVIDER_SITE_OTHER): Payer: Self-pay | Admitting: Licensed Clinical Social Worker

## 2019-05-01 ENCOUNTER — Other Ambulatory Visit: Payer: Self-pay

## 2019-05-01 DIAGNOSIS — F4310 Post-traumatic stress disorder, unspecified: Secondary | ICD-10-CM

## 2019-05-01 NOTE — Progress Notes (Signed)
~  Patient ID: Vincent Davis, male   DOB: 06/01/1976, 43 y.o.   MRN: [id number]  Virtual Visit via Telephone Note  I connected with Vincent Davis on 05/01/19 at  8:00 AM EDT by telephone and verified that I am speaking with the correct person using two identifiers.  I discussed the limitations, risks, security and privacy concerns of performing an evaluation and management service by telephone and the availability of in person appointments. I also discussed with the patient that there may be a patient responsible charge related to this service. The patient expressed understanding and agreed to proceed.  I discussed the assessment and treatment plan with the patient. The patient was provided an opportunity to ask questions and all were answered. The patient agreed with the plan and demonstrated an understanding of the instructions.   The patient was advised to call back or seek an in-person evaluation if the symptoms worsen or if the condition fails to improve as anticipated.  Type of Therapy: Individual Therapy  Treatment Goals addressed:  Reduce the negative impact the traumatic event has had on many aspects of life and return to pre-trauma level of functioning   Interventions: CBT, Supportive Counseling  Summary: Vincent Davis is a 43 y.o. male who presents with symptoms of nightmares, anxiety about leaving the house, avoidance of situations that could result in hospitalization, re-experiencing, hypervigilance, and some feelings of disconnection to his own emotions and others.  Therapist Response:  Patient met with clinician for an individual session. Patient reports that he did not sleep well last night. He processed the nightmare he had about being back in the hospital and seeing the machines he is hooked up to like he did when he first woke in the hospital. Counselor and patient discussed the impact this has on patient's day. Patient identified that he finds some peace in listening to  music and sitting outside to be a part of nature. Counselor guided patient to reflect on what would mean progress or coping well to him. Patient stated that he would like to be able to cook again without fearing he would injure someone and be the reason they have to go to the hospital, and that he would like to be able to be in a store, masked, and hear someone else sneeze or cough without feeling panicked and running out of the store for fear he will get sick and have to be hospitalized. He asked if there is a timeline for healing or for dealing with PTSD. Counselor explained that everyone's recovery is different based on so many variables, and encouraged patient to look at his progress but not put pressure on himself to be at any specific phase or step. Counselor and patient explored self-talk that can help patient feel less afraid. Counselor and patient also discussed various behaviors patient engages in and whether they connect to positive or negative thoughts/calm or anxious feelings.   Suicidal/Homicidal:  No   Recommendation and plan:  Continued sessions every other week  Diagnosis:  Post Traumatic Stress Disorder  I provided 56 minutes of non-face-to-face time during this encounter.   Vincent Fragmin, LCSW

## 2019-05-13 ENCOUNTER — Ambulatory Visit (HOSPITAL_COMMUNITY): Payer: Self-pay | Admitting: Licensed Clinical Social Worker

## 2019-05-15 ENCOUNTER — Ambulatory Visit (INDEPENDENT_AMBULATORY_CARE_PROVIDER_SITE_OTHER): Payer: Self-pay | Admitting: Licensed Clinical Social Worker

## 2019-05-15 ENCOUNTER — Other Ambulatory Visit: Payer: Self-pay

## 2019-05-15 DIAGNOSIS — F4310 Post-traumatic stress disorder, unspecified: Secondary | ICD-10-CM

## 2019-05-15 NOTE — Progress Notes (Signed)
Cousnelor attempted to call PT at 8AM and 8:07AM w/ no response. Counselor was unable to leave a Voicemale due to restrictions on PT's phone. Pt did not return call.

## 2019-05-20 ENCOUNTER — Ambulatory Visit (INDEPENDENT_AMBULATORY_CARE_PROVIDER_SITE_OTHER): Payer: Self-pay | Admitting: Psychiatry

## 2019-05-20 ENCOUNTER — Encounter (HOSPITAL_COMMUNITY): Payer: Self-pay | Admitting: Psychiatry

## 2019-05-20 ENCOUNTER — Other Ambulatory Visit: Payer: Self-pay

## 2019-05-20 ENCOUNTER — Telehealth: Payer: Self-pay

## 2019-05-20 ENCOUNTER — Other Ambulatory Visit: Payer: Self-pay | Admitting: Family Medicine

## 2019-05-20 DIAGNOSIS — G4730 Sleep apnea, unspecified: Secondary | ICD-10-CM

## 2019-05-20 DIAGNOSIS — G8929 Other chronic pain: Secondary | ICD-10-CM

## 2019-05-20 DIAGNOSIS — F331 Major depressive disorder, recurrent, moderate: Secondary | ICD-10-CM

## 2019-05-20 DIAGNOSIS — F4310 Post-traumatic stress disorder, unspecified: Secondary | ICD-10-CM

## 2019-05-20 MED ORDER — ESCITALOPRAM OXALATE 20 MG PO TABS: 40.0000 mg | ORAL_TABLET | Freq: Every day | ORAL | 1 refills | Status: DC

## 2019-05-20 MED FILL — ESCITALOPRAM 20 MG TABLET: 20 | 30 days supply | Qty: 60 | Fill #0

## 2019-05-20 NOTE — Telephone Encounter (Signed)
Patient was advised I would only call if we were unable to send referrals. Thanks!
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
## 2019-05-20 NOTE — Telephone Encounter (Signed)
Vincent Davis,  I called and spoke with patient. He is asking for referral for physical therapy for his Back he had hx of back surgery and is still experiencing pain.   Also,  He states he recently saw Psychiatrist and they suggested he get a sleep study for possible sleep apnea. Can this be order for him? Please advise.

## 2019-05-23 ENCOUNTER — Encounter (HOSPITAL_COMMUNITY): Payer: Self-pay

## 2019-05-29 ENCOUNTER — Other Ambulatory Visit: Payer: Self-pay

## 2019-05-29 ENCOUNTER — Telehealth: Payer: Self-pay

## 2019-05-29 DIAGNOSIS — M545 Low back pain, unspecified: Secondary | ICD-10-CM

## 2019-05-29 DIAGNOSIS — G4700 Insomnia, unspecified: Secondary | ICD-10-CM

## 2019-05-29 DIAGNOSIS — F419 Anxiety disorder, unspecified: Secondary | ICD-10-CM

## 2019-05-29 DIAGNOSIS — M79602 Pain in left arm: Secondary | ICD-10-CM

## 2019-05-29 DIAGNOSIS — R0602 Shortness of breath: Secondary | ICD-10-CM

## 2019-05-29 MED ORDER — ALBUTEROL SULFATE HFA 108 (90 BASE) MCG/ACT IN AERS: 2.0000 | INHALATION_SPRAY | Freq: Four times a day (QID) | RESPIRATORY_TRACT | 2 refills | Status: DC | PRN

## 2019-05-29 MED ORDER — MELOXICAM 15 MG PO TABS: 15.0000 mg | ORAL_TABLET | Freq: Every day | ORAL | 3 refills | Status: DC

## 2019-05-29 MED ORDER — TRAZODONE HCL 100 MG PO TABS: 100.0000 mg | ORAL_TABLET | Freq: Every evening | ORAL | 2 refills | Status: DC | PRN

## 2019-05-29 MED FILL — MELOXICAM 15 MG TABLET: 15 | 30 days supply | Qty: 30 | Fill #0

## 2019-05-29 MED FILL — traZODone HCL 100 MG TABS: 100 | 30 days supply | Qty: 30 | Fill #0

## 2019-05-29 MED FILL — ALBUTEROL SULFATE HFA 108 (: 108 (90 BAS | 25 days supply | Qty: 18 | Fill #0

## 2019-05-29 NOTE — Telephone Encounter (Signed)
Called and spoke with pt he wanted to why he need to have covid test done before his sleep study . I explain to him that this something that the hospital is requiring all patient to do before they have any test or produce done. Pt also wanted refill on meds . Sent refill to pt pharmacy.

## 2019-06-05 ENCOUNTER — Encounter (HOSPITAL_COMMUNITY): Payer: Self-pay | Admitting: Licensed Clinical Social Worker

## 2019-06-05 ENCOUNTER — Ambulatory Visit (INDEPENDENT_AMBULATORY_CARE_PROVIDER_SITE_OTHER): Payer: Self-pay | Admitting: Licensed Clinical Social Worker

## 2019-06-05 DIAGNOSIS — F4310 Post-traumatic stress disorder, unspecified: Secondary | ICD-10-CM

## 2019-06-05 DIAGNOSIS — F331 Major depressive disorder, recurrent, moderate: Secondary | ICD-10-CM

## 2019-06-05 NOTE — Progress Notes (Signed)
Virtual Visit via Video Note  I connected with Vincent Davis on 06/05/19 at 11:00 AM EDT by a video enabled telemedicine application and verified that I am speaking with the correct person using two identifiers.  Location: Patient: Home Provider: Home Office   I discussed the limitations of evaluation and management by telemedicine and the availability of in person appointments. The patient expressed understanding and agreed to proceed.     I discussed the assessment and treatment plan with the patient. The patient was provided an opportunity to ask questions and all were answered. The patient agreed with the plan and demonstrated an understanding of the instructions.   The patient was advised to call back or seek an in-person evaluation if the symptoms worsen or if the condition fails to improve as anticipated.  I provided  minutes of non-face-to-face time during this encounter.   Archie Balboa, LCAS-A    THERAPIST PROGRESS NOTE  Session Time: 11-12  Participation Level: Active  Behavioral Response: NAAlertDepressed and Irritable  Type of Therapy: Individual Therapy  Treatment Goals addressed: Anxiety  Interventions: CBT and Supportive  Summary: Vincent Davis is a 43 y.o. male who presents with sxs of PTSD from complications during a surgery in 12/18 that led to him being unconscious for 20 mins. PT reports he still has flashbacks and trouble sleeping for more than 4 hours at a time. He is irritable and states he is angry in session since he is "thinking so much about the trauma". 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 HE is worried he may have to go to the hospital again. PT states he mostly isolates at home, watching TV and living w/ his roommate who he states he trusts. We discuss PT's upcoming sleep study and I advise him to  petition to get an "at home" study performed since he is triggered by the hospital.   Suicidal/Homicidal: Nowithout intent/plan  Therapist Response: I used open questions, active listening and therapeutic rapport building techniques. I disclose about my caucasian race and inquire about how the recent political events have impacted PT since he keeps seeing the phrase "I can't breathe" printed and in the media which reminds him of his trauma.  Plan: Return again in 2 weeks.  Diagnosis:    ICD-10-CM   1. PTSD (post-traumatic stress disorder)  F43.10   2. MDD (major depressive disorder), recurrent episode, moderate ([place])  F33.1      Archie Balboa, LCAS-A 06/05/2019

## 2019-06-10 ENCOUNTER — Telehealth: Payer: Self-pay

## 2019-06-10 ENCOUNTER — Ambulatory Visit: Payer: Self-pay | Admitting: Physical Therapy

## 2019-06-10 ENCOUNTER — Inpatient Hospital Stay (HOSPITAL_COMMUNITY): Admission: RE | Admit: 2019-06-10 | Payer: Self-pay | Source: Ambulatory Visit

## 2019-06-10 ENCOUNTER — Other Ambulatory Visit: Payer: Self-pay | Admitting: Family Medicine

## 2019-06-10 DIAGNOSIS — G4730 Sleep apnea, unspecified: Secondary | ICD-10-CM

## 2019-06-10 NOTE — Telephone Encounter (Signed)
Pt called because he receive a call from p/t about a referral that was put in for back pain, pt said that he wasn't having back pain that he is have arm. I told per the note that put on the 05/20/19 he called and stated that he was having back and had hx of back surgeries,i told him to keep that appt. That has set p/t they do an assessment if the referral needs to be charged physical will let us know. Because the pain could becoming form  His back.

## 2019-06-12 ENCOUNTER — Inpatient Hospital Stay (HOSPITAL_COMMUNITY)
Admission: RE | Admit: 2019-06-12 | Discharge: 2019-06-12 | Disposition: A | Discharge status: Home / Self Care | Payer: Self-pay | Source: Ambulatory Visit

## 2019-06-12 NOTE — Progress Notes (Signed)
Patient returned call that he will be canceling his sleep study until they can figure out if he can have a home study.  Patient is not coming for his COVID test today

## 2019-06-12 NOTE — Progress Notes (Signed)
I spoke with Vincent Davis about coming for his pre-procedure covid test before his sleep study tomorrow.  Vincent Davis stated that he has severe PTSD and that he has been working to change his sleep study to a in home study. Patient stated that he is going to call now and see if that can be changed.  Instructed patient that he will need to have his test completed today by 3 pm if he plans to have the sleep study in facility tomorrow.  Patient verbalized understanding

## 2019-06-13 ENCOUNTER — Encounter: Payer: Self-pay | Admitting: Physical Therapy

## 2019-06-13 ENCOUNTER — Encounter (HOSPITAL_BASED_OUTPATIENT_CLINIC_OR_DEPARTMENT_OTHER): Payer: Self-pay | Admitting: Internal Medicine

## 2019-06-13 ENCOUNTER — Other Ambulatory Visit: Payer: Self-pay

## 2019-06-13 ENCOUNTER — Ambulatory Visit: Payer: Self-pay | Attending: Family Medicine | Admitting: Physical Therapy

## 2019-06-13 DIAGNOSIS — M79602 Pain in left arm: Secondary | ICD-10-CM

## 2019-06-13 DIAGNOSIS — M542 Cervicalgia: Secondary | ICD-10-CM

## 2019-06-13 DIAGNOSIS — M5412 Radiculopathy, cervical region: Secondary | ICD-10-CM
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
## 2019-06-13 NOTE — Addendum Note (Signed)
Addended by: Rosaura Carpenter D on: 06/13/2019 08:36 PM   Modules accepted: Orders

## 2019-06-16 MED FILL — ESCITALOPRAM 20 MG TABLET: 20 | 30 days supply | Qty: 60 | Fill #0

## 2019-06-16 MED FILL — MELOXICAM 15 MG TABLET: 15 | 30 days supply | Qty: 30 | Fill #0

## 2019-06-16 MED FILL — traZODone HCL 100 MG TABS: 100 | 30 days supply | Qty: 30 | Fill #0

## 2019-06-16 MED FILL — ALBUTEROL SULFATE HFA 108 (: 108 (90 BAS | 25 days supply | Qty: 18 | Fill #0

## 2019-06-19 ENCOUNTER — Other Ambulatory Visit: Payer: Self-pay

## 2019-06-19 ENCOUNTER — Ambulatory Visit (HOSPITAL_COMMUNITY): Payer: Self-pay | Admitting: Psychiatry

## 2019-06-23 ENCOUNTER — Encounter (HOSPITAL_COMMUNITY): Payer: Self-pay | Admitting: Psychiatry

## 2019-06-23 ENCOUNTER — Ambulatory Visit (INDEPENDENT_AMBULATORY_CARE_PROVIDER_SITE_OTHER): Payer: Self-pay | Admitting: Psychiatry

## 2019-06-23 ENCOUNTER — Ambulatory Visit (INDEPENDENT_AMBULATORY_CARE_PROVIDER_SITE_OTHER): Payer: Self-pay | Admitting: Licensed Clinical Social Worker

## 2019-06-23 ENCOUNTER — Encounter (HOSPITAL_COMMUNITY): Payer: Self-pay | Admitting: Licensed Clinical Social Worker

## 2019-06-23 DIAGNOSIS — F4310 Post-traumatic stress disorder, unspecified: Secondary | ICD-10-CM

## 2019-06-23 DIAGNOSIS — F331 Major depressive disorder, recurrent, moderate: Secondary | ICD-10-CM
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
## 2019-06-23 NOTE — Progress Notes (Signed)
Virtual Visit via Telephone Note  I connected with Vincent Davis on 06/23/19 at  1:30 PM EDT by telephone and verified that I am speaking with the correct person using two identifiers.  Location: Patient: Home Provider: Office   I discussed the limitations, risks, security and privacy concerns of performing an evaluation and management service by telephone and the availability of in person appointments. I also discussed with the patient that there may be a patient responsible charge related to this service. The patient expressed understanding and agreed to proceed.     I discussed the assessment and treatment plan with the patient. The patient was provided an opportunity to ask questions and all were answered. The patient agreed with the plan and demonstrated an understanding of the instructions.   The patient was advised to call back or seek an in-person evaluation if the symptoms worsen or if the condition fails to improve as anticipated.  I provided 45 minutes of non-face-to-face time during this encounter.   Archie Balboa, LCAS-A    THERAPIST PROGRESS NOTE  Session Time: 1:30-2:30  Participation Level: Active  Behavioral Response: NAAlertNegative  Type of Therapy: Individual Therapy  Treatment Goals addressed: Anxiety and Coping  Interventions: CBT and Supportive  Summary: Vincent Davis is a 43 y.o. male who presents with recent hx of PTSD resulting from complications from surgery 1 year ago. PT reports he was "not anticipating this call" and was unaware he meeting me today. He states he continues to experience nightmares and restlessness nearly every night. He does his best to get outside daily. He does not socialize much due to COVID. He talks to his adult son fairly regularly. PT states he is disheartened to have to switch therapist recently and feels that he is "constantly starting over".    Suicidal/Homicidal: Nowithout intent/plan  Therapist Response:  I used open questions, active listening, and reflection. I used psychoeducation to inform PT that he "does not have to rehash old details of his trauma" if he does not want to. I reminded him that I can be an effective counselor w/o every detail. He seemed reassured by this.   Plan: Return again in 2 weeks.  Diagnosis:    ICD-10-CM   1. PTSD (post-traumatic stress disorder)  F43.10       Archie Balboa, LCAS-A 06/23/2019

## 2019-06-27 ENCOUNTER — Ambulatory Visit: Payer: Self-pay | Admitting: Family Medicine

## 2019-07-07 ENCOUNTER — Ambulatory Visit: Payer: Self-pay | Admitting: Family Medicine

## 2019-07-07 ENCOUNTER — Encounter (HOSPITAL_COMMUNITY): Payer: Self-pay | Admitting: Licensed Clinical Social Worker

## 2019-07-07 ENCOUNTER — Ambulatory Visit (INDEPENDENT_AMBULATORY_CARE_PROVIDER_SITE_OTHER): Payer: Self-pay | Admitting: Licensed Clinical Social Worker

## 2019-07-07 DIAGNOSIS — F331 Major depressive disorder, recurrent, moderate: Secondary | ICD-10-CM

## 2019-07-07 DIAGNOSIS — F4310 Post-traumatic stress disorder, unspecified: Secondary | ICD-10-CM

## 2019-07-07 NOTE — Progress Notes (Signed)
Called PT at 11:01AM for phone session, as requested by PT. PT did not answer. I left a voicemail asking for PT to return call. I called PT again at 11:07AM w/ no answer.

## 2019-07-11 ENCOUNTER — Ambulatory Visit: Payer: Self-pay | Admitting: Physical Therapy

## 2019-07-15 ENCOUNTER — Encounter: Payer: Self-pay | Admitting: Family Medicine

## 2019-07-15 ENCOUNTER — Other Ambulatory Visit: Payer: Self-pay

## 2019-07-15 ENCOUNTER — Ambulatory Visit (INDEPENDENT_AMBULATORY_CARE_PROVIDER_SITE_OTHER): Payer: Self-pay | Admitting: Family Medicine

## 2019-07-15 VITALS — BP 128/82 | HR 82 | Ht 71.0 in | Wt 246.4 lb

## 2019-07-15 DIAGNOSIS — F419 Anxiety disorder, unspecified: Secondary | ICD-10-CM

## 2019-07-15 DIAGNOSIS — I10 Essential (primary) hypertension: Secondary | ICD-10-CM

## 2019-07-15 DIAGNOSIS — Z09 Encounter for follow-up examination after completed treatment for conditions other than malignant neoplasm: Secondary | ICD-10-CM

## 2019-07-15 DIAGNOSIS — F4310 Post-traumatic stress disorder, unspecified: Secondary | ICD-10-CM

## 2019-07-15 DIAGNOSIS — Z202 Contact with and (suspected) exposure to infections with a predominantly sexual mode of transmission: Secondary | ICD-10-CM

## 2019-07-15 LAB — POCT URINALYSIS DIPSTICK
Bilirubin, UA: NEGATIVE
Blood, UA: NEGATIVE
Ketones, UA: NEGATIVE
Leukocytes, UA: NEGATIVE
Nitrite, UA: NEGATIVE
Protein, UA: NEGATIVE
Spec Grav, UA: >=1.03 — AB (ref 1.010–1.025)
Urobilinogen, UA: 0.2 E.U./dL
pH, UA: 5.5 (ref 5.0–8.0)

## 2019-07-15 NOTE — Progress Notes (Signed)
[facility]  Sick Visit  Subjective:  Patient ID: Vincent Davis, male    DOB: 1975-10-15  Age: 43 y.o. MRN: [id number]  CC:  Chief Complaint  Patient presents with  . Exposure to STD    possible exposure    HPI Vincent Davis is a 43 year old male who presents for Follow Up today.   Past Medical History:  Diagnosis Date  . 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 Cervical spondylosis with radiculopathy   . Complication of anesthesia    no previous surgery  . Family history of adverse reaction to anesthesia    "I don't know."  . Obesity (BMI 30.0-34.9)   . Pneumonia    as a child  . PTSD (post-traumatic stress disorder)    Current Status: Since his last office visit, he has been having back pain X 1 week ago. He states that he also has itching on his scrotum. He denies urinary frequency, discharge, dysuria, urinary itching, burning, odor, hematuria, and suprapubic pain/discomfort. His partner is his stable partner. His anxiety is moderate today. He denies suicidal ideations, homicidal ideations, or auditory hallucinations. He states that he has a court date for disability on 08/15/2019. He denies fevers, chills, fatigue, recent infections, weight loss, and night sweats. He has not had any headaches, visual changes, dizziness, and falls. No chest pain, heart palpitations, cough and shortness of breath reported. No reports of GI problems such as nausea, vomiting, diarrhea, and constipation. He has no reports of blood in stools, dysuria and hematuria. He denies pain today.   Past Surgical History:  Procedure Laterality Date  . ANTERIOR CERVICAL DECOMP/DISCECTOMY FUSION N/A 09/03/2017   Procedure: CERVICAL SIX-SEVEN  ANTERIOR CERVICAL DISCECTOMY AND FUSION, ALLOGRAFT, PLATE;  Surgeon: Eldred Manges, MD;  Location: [facility] OR;  Service: Orthopedics;  Laterality: N/A;  . EVACUATION OF CERVICAL HEMATOMA N/A 09/07/2017    Procedure: EVACUATION OF NECK HEMATOMA;  Surgeon: Eldred Manges, MD;  Location: [facility] OR;  Service: Orthopedics;  Laterality: N/A;  . NO PAST SURGERIES      Family History  Problem Relation Age of Onset  . Other Mother        Healthy    Social History   Socioeconomic History  . Marital status: Single    Spouse name: Not on file  . Number of children: 2  . Years of education: some college  . Highest education level: Not on file  Occupational History    Comment: NA  Social Needs  . Financial resource strain: Not on file  . Food insecurity    Worry: Not on file    Inability: Not on file  . Transportation needs    Medical: Not on file    Non-medical: Not on file  Tobacco Use  . Smoking status: Former Smoker    Types: Cigarettes  . Smokeless tobacco: Never Used  Substance and Sexual Activity  . Alcohol use: Yes    Comment: occasional  . Drug use: Yes    Types: Marijuana    Comment: last use 08/29/17  . Sexual activity: Not on file  Lifestyle  . Physical activity    Days per week: Not on file    Minutes per session: Not on file  . Stress: Not on file  Relationships  . Social Musician on phone: Not on file    Gets together: Not on file  Attends religious service: Not on file    Active member of club or organization: Not on file    Attends meetings of clubs or organizations: Not on file    Relationship status: Not on file  . Intimate partner violence    Fear of current or ex partner: Not on file    Emotionally abused: Not on file    Physically abused: Not on file    Forced sexual activity: Not on file  Other Topics Concern  . Not on file  Social History Narrative   Lives with sig other   Caffeine- not daily    Outpatient Medications Prior to Visit  Medication Sig Dispense Refill  . albuterol (VENTOLIN HFA) 108 (90 Base) MCG/ACT inhaler Inhale 2 puffs into the lungs every 6 (six) hours as needed for wheezing or shortness of breath. 8 g 2  .  aspirin EC 81 MG tablet Take 81 mg by mouth daily.    Marland Kitchen. escitalopram (LEXAPRO) 20 MG tablet Take 2 tablets (40 mg total) by mouth daily. 60 tablet 1  . levocetirizine (XYZAL) 5 MG tablet Take 5 mg by mouth daily as needed for allergies.    . meloxicam (MOBIC) 15 MG tablet Take 1 tablet (15 mg total) by mouth daily. 30 tablet 3  . traMADol (ULTRAM) 50 MG tablet Take 50 mg by mouth every 6 (six) hours as needed.    . traZODone (DESYREL) 100 MG tablet Take 1 tablet (100 mg total) by mouth at bedtime as needed for sleep. 30 tablet 2   No facility-administered medications prior to visit.     No Known Allergies  ROS Review of Systems  Constitutional: Negative.   HENT: Negative.   Eyes: Negative.   Respiratory: Negative.   Cardiovascular: Negative.   Gastrointestinal: Positive for abdominal distention.  Endocrine: Negative.   Genitourinary: Negative.   Musculoskeletal: Negative.   Skin: Negative.   Allergic/Immunologic: Negative.   Neurological: Negative.   Hematological: Negative.   Psychiatric/Behavioral: Negative.       Objective:    Physical Exam  Constitutional: He is oriented to person, place, and time. He appears well-developed and well-nourished.  HENT:  Head: Normocephalic and atraumatic.  Eyes: Conjunctivae are normal.  Neck: Normal range of motion. Neck supple.  Cardiovascular: Normal rate, regular rhythm, normal heart sounds and intact distal pulses.  Pulmonary/Chest: Effort normal and breath sounds normal.  Abdominal: Soft. Bowel sounds are normal. He exhibits distension.  Musculoskeletal:     Comments: Left arm pain and weakness.   Neurological: He is alert and oriented to person, place, and time.  Skin: Skin is warm and dry.  Nursing note and vitals reviewed.   BP 128/82 (BP Location: Right Arm, Patient Position: Sitting, Cuff Size: Large)   Pulse 82   Ht 5\' 11"  (1.803 m)   Wt 246 lb 6.4 oz (111.8 kg)   SpO2 98%   BMI 34.37 kg/m  Wt Readings from Last 3  Encounters:  07/15/19 246 lb 6.4 oz (111.8 kg)  04/25/19 257 lb (116.6 kg)  11/13/18 240 lb (108.9 kg)     Health Maintenance Due  Topic Date Due  . INFLUENZA VACCINE  05/03/2019    There are no preventive care reminders to display for this patient.  Lab Results  Component Value Date   TSH 1.320 04/15/2018   Lab Results  Component Value Date   WBC 11.3 (H) 11/03/2018   HGB 15.9 11/03/2018   HCT 52.6 (H) 11/03/2018   MCV  92.6 11/03/2018   PLT 403 (H) 11/03/2018   Lab Results  Component Value Date   NA 138 11/03/2018   K 3.8 11/03/2018   CO2 20 (L) 11/03/2018   GLUCOSE 113 (H) 11/03/2018   BUN 9 11/03/2018   CREATININE 1.14 11/03/2018   BILITOT 0.6 11/03/2018   ALKPHOS 46 11/03/2018   AST 25 11/03/2018   ALT 28 11/03/2018   PROT 8.3 (H) 11/03/2018   ALBUMIN 4.7 11/03/2018   CALCIUM 9.0 11/03/2018   ANIONGAP 14 11/03/2018   Lab Results  Component Value Date   CHOL 181 04/15/2018   Lab Results  Component Value Date   HDL 63 04/15/2018   Lab Results  Component Value Date   LDLCALC 92 04/15/2018   Lab Results  Component Value Date   TRIG 130 04/15/2018   Lab Results  Component Value Date   CHOLHDL 2.9 04/15/2018   Lab Results  Component Value Date   HGBA1C 5.3 10/25/2018      Assessment & Plan:   1. Essential hypertension The current medical regimen is effective; he is stable at 128/82 today; continue present plan and medications as prescribed. He will continue to take medications as prescribed, to decrease high sodium intake, excessive alcohol intake, increase potassium intake, smoking cessation, and increase physical activity of at least 30 minutes of cardio activity daily. He will continue to follow Heart Healthy or DASH diet.  2. Possible exposure to STD - POCT urinalysis dipstick - GC/Chlamydia Probe Amp([facility]) - Trichomonas vaginalis, RNA - HepB+HepC+HIV Panel - HSV(herpes smplx)abs-1+2(IgG+IgM)-bld  3. PTSD (post-traumatic stress  disorder)  4. Anxiety  5. Follow up He will follow up in 3 months.   No orders of the defined types were placed in this encounter.   Orders Placed This Encounter  Procedures  . GC/Chlamydia Probe Amp([facility])  . Trichomonas vaginalis, RNA  . HepB+HepC+HIV Panel  . HSV(herpes smplx)abs-1+2(IgG+IgM)-bld  . POCT urinalysis dipstick    Referral Orders  No referral(s) requested today    Kathe Becton,  MSN, FNP-BC [facility] [address] [place], [place] [address] [phone]- fax  Problem List Items Addressed This Visit      Cardiovascular and Mediastinum   Essential hypertension - Primary     Other   Anxiety   PTSD (post-traumatic stress disorder)    Other Visit Diagnoses    Possible exposure to STD       Relevant Orders   POCT urinalysis dipstick (Completed)   GC/Chlamydia Probe Amp([facility])   Trichomonas vaginalis, RNA   HepB+HepC+HIV Panel   HSV(herpes smplx)abs-1+2(IgG+IgM)-bld   Follow up          No orders of the defined types were placed in this encounter.   Follow-up: Return in about 3 months (around 10/15/2019).    Azzie Glatter, FNP

## 2019-07-16 LAB — GC/CHLAMYDIA PROBE AMP
Chlamydia trachomatis, NAA: NEGATIVE
Neisseria Gonorrhoeae by PCR: NEGATIVE

## 2019-07-17 LAB — HEPB+HEPC+HIV PANEL
HIV Screen 4th Generation wRfx: NONREACTIVE
Hep B C IgM: NEGATIVE
Hep B Core Total Ab: NEGATIVE
Hep B E Ab: NEGATIVE
Hep B E Ag: NEGATIVE
Hep B Surface Ab, Qual: NONREACTIVE
Hep C Virus Ab: <0.1 s/co ratio (ref 0.0–0.9)
Hepatitis B Surface Ag: NEGATIVE

## 2019-07-17 LAB — TRICHOMONAS VAGINALIS, PROBE AMP: Trich vag by NAA: NEGATIVE

## 2019-07-17 LAB — HSV(HERPES SMPLX)ABS-I+II(IGG+IGM)-BLD
HSV 1 Glycoprotein G Ab, IgG: 31 index — ABNORMAL HIGH (ref 0.00–0.90)
HSV 2 IgG, Type Spec: <0.91 index (ref 0.00–0.90)
HSVI/II Comb IgM: <0.91 Ratio (ref 0.00–0.90)

## 2019-07-18 ENCOUNTER — Ambulatory Visit: Payer: Self-pay | Attending: Family Medicine | Admitting: Physical Therapy

## 2019-07-21 ENCOUNTER — Ambulatory Visit (HOSPITAL_BASED_OUTPATIENT_CLINIC_OR_DEPARTMENT_OTHER): Payer: Self-pay | Attending: Family Medicine | Admitting: Internal Medicine

## 2019-07-21 ENCOUNTER — Other Ambulatory Visit: Payer: Self-pay

## 2019-07-21 VITALS — Ht 72.0 in | Wt 246.0 lb

## 2019-07-21 DIAGNOSIS — G4730 Sleep apnea, unspecified: Secondary | ICD-10-CM | POA: Insufficient documentation

## 2019-07-21 MED FILL — ALBUTEROL SULFATE HFA 108 (: 108 (90 BAS | 25 days supply | Qty: 18 | Fill #1

## 2019-07-23 ENCOUNTER — Ambulatory Visit (INDEPENDENT_AMBULATORY_CARE_PROVIDER_SITE_OTHER): Payer: Self-pay | Admitting: Psychiatry

## 2019-07-23 ENCOUNTER — Encounter (HOSPITAL_COMMUNITY): Payer: Self-pay | Admitting: Psychiatry

## 2019-07-23 DIAGNOSIS — F331 Major depressive disorder, recurrent, moderate: Secondary | ICD-10-CM

## 2019-07-23 DIAGNOSIS — F4310 Post-traumatic stress disorder, unspecified: Secondary | ICD-10-CM

## 2019-07-23 MED ORDER — ESCITALOPRAM OXALATE 20 MG PO TABS: 40.0000 mg | ORAL_TABLET | Freq: Every day | ORAL | 1 refills | Status: DC

## 2019-07-23 MED FILL — ESCITALOPRAM 20 MG TABLET: 20 | 30 days supply | Qty: 60 | Fill #0

## 2019-07-23 NOTE — Progress Notes (Signed)
[facility] Outpatient Follow up visit   Patient Identification: Vincent Davis MRN:  [id number] Date of Evaluation:  07/23/2019 Referral Source: Therapist Chief Complaint:   anxiety and ptsd follow up Visit Diagnosis:    ICD-10-CM   1. PTSD (post-traumatic stress disorder)  F43.10   2. MDD (major depressive disorder), recurrent episode, moderate ([place])  F33.1        I connected with [name] on 07/23/19 at  1:15 PM EDT by telephone and verified that I am speaking with the correct person using two identifiers.   I discussed the limitations, risks, security and privacy concerns of performing an evaluation and management service by telephone and the availability of in person appointments. I also discussed with the patient that there may be a patient responsible charge related to this service. The patient expressed understanding and agreed to proceed.      History of Present Illness: 43 years old currently single African-American male living with his girlfriend, initially referred by his therapist for management of PTSD and depression  Patient had an incident postop  in December 2018 when he went for a cervical surgery C6-C7 "review prior notes if needed.   Still has flashbacks, missed or was not able to connect last therapy appointment Had sleep study done, frequent awakenings and got somewhat traumatized with the wires and leads in the beginning with flashbacks, results are pending Takes trazadone for sleep On lexapro for PtSD and depression, manages some but understands have to work in therapy  He has another friend now helping and giving support , says he splitting with current GF for a while  Still dwells on past and about his hospital admission  Triggers make him upset   Worries keep him upset financial and upcoming court date for disabililty as well  States not be drinking or using marijuana .  He understands not to drink  No side effects reported  Modifying  factors" new friend, Mom but she lives away Duration since December 2018  No prior past psychiatric history before December 2018   Past Psychiatric History: denies prior treatment before 2018  Previous Psychotropic Medications: No   Substance Abuse History in the last 12 months:  No.  Consequences of Substance Abuse: NA  Past Medical History:  Past Medical History:  Diagnosis Date  . 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 Cervical spondylosis with radiculopathy   . Complication of anesthesia    no previous surgery  . Family history of adverse reaction to anesthesia    "I don't know."  . Obesity (BMI 30.0-34.9)   . Pneumonia    as a child  . PTSD (post-traumatic stress disorder)     Past Surgical History:  Procedure Laterality Date  . ANTERIOR CERVICAL DECOMP/DISCECTOMY FUSION N/A 09/03/2017   Procedure: CERVICAL SIX-SEVEN  ANTERIOR CERVICAL DISCECTOMY AND FUSION, ALLOGRAFT, PLATE;  Surgeon: Marybelle Killings, MD;  Location: [facility];  Service: Orthopedics;  Laterality: N/A;  . EVACUATION OF CERVICAL HEMATOMA N/A 09/07/2017   Procedure: EVACUATION OF NECK HEMATOMA;  Surgeon: Marybelle Killings, MD;  Location: [facility];  Service: Orthopedics;  Laterality: N/A;  . NO PAST SURGERIES      Family Psychiatric History: denies, but says most of them may have some . coudlnt elaborate any more  Family History:  Family History  Problem Relation Age of Onset  . Other Mother        Healthy    Social History:   Social History  Socioeconomic History  . Marital status: Single    Spouse name: Not on file  . Number of children: 2  . Years of education: some college  . Highest education level: Not on file  Occupational History    Comment: NA  Social Needs  . Financial resource strain: Not on file  . Food insecurity    Worry: Not on file    Inability: Not on file  . Transportation needs    Medical: Not on file    Non-medical: Not on file  Tobacco Use  . Smoking status:  Former Smoker    Types: Cigarettes  . Smokeless tobacco: Never Used  Substance and Sexual Activity  . Alcohol use: Yes    Comment: occasional  . Drug use: Yes    Types: Marijuana    Comment: last use 08/29/17  . Sexual activity: Not on file  Lifestyle  . Physical activity    Days per week: Not on file    Minutes per session: Not on file  . Stress: Not on file  Relationships  . Social Musicianconnections    Talks on phone: Not on file    Gets together: Not on file    Attends religious service: Not on file    Active member of club or organization: Not on file    Attends meetings of clubs or organizations: Not on file    Relationship status: Not on file  Other Topics Concern  . Not on file  Social History Narrative   Lives with sig other   Caffeine- not daily    Allergies:  No Known Allergies  Metabolic Disorder Labs: Lab Results  Component Value Date   HGBA1C 5.3 10/25/2018   No results found for: PROLACTIN Lab Results  Component Value Date   CHOL 181 04/15/2018   TRIG 130 04/15/2018   HDL 63 04/15/2018   CHOLHDL 2.9 04/15/2018   LDLCALC 92 04/15/2018   Lab Results  Component Value Date   TSH 1.320 04/15/2018    Therapeutic Level Labs: No results found for: LITHIUM No results found for: CBMZ No results found for: VALPROATE  Current Medications: Current Outpatient Medications  Medication Sig Dispense Refill  . albuterol (VENTOLIN HFA) 108 (90 Base) MCG/ACT inhaler Inhale 2 puffs into the lungs every 6 (six) hours as needed for wheezing or shortness of breath. 8 g 2  . aspirin EC 81 MG tablet Take 81 mg by mouth daily.    Marland Kitchen. escitalopram (LEXAPRO) 20 MG tablet Take 2 tablets (40 mg total) by mouth daily. 60 tablet 1  . levocetirizine (XYZAL) 5 MG tablet Take 5 mg by mouth daily as needed for allergies.    . meloxicam (MOBIC) 15 MG tablet Take 1 tablet (15 mg total) by mouth daily. 30 tablet 3  . traMADol (ULTRAM) 50 MG tablet Take 50 mg by mouth every 6 (six) hours  as needed.    . traZODone (DESYREL) 100 MG tablet Take 1 tablet (100 mg total) by mouth at bedtime as needed for sleep. 30 tablet 2   No current facility-administered medications for this visit.       Psychiatric Specialty Exam: Review of Systems  Cardiovascular: Negative for palpitations.  Skin: Negative for rash.  Psychiatric/Behavioral: The patient has insomnia.     There were no vitals taken for this visit.There is no height or weight on file to calculate BMI.  General Appearance:   Eye Contact:  Fair  Speech:  Slow  Volume:  Decreased  Mood:  Somewhat subdued  Affect:  Constricted  Thought Process:  Goal Directed  Orientation:  Full (Time, Place, and Person)  Hallucinations as if a shadow at the corner, whisteling sounds at time  Suicidal Thoughts:  No  Homicidal Thoughts:  No  Memory:  Immediate;   Fair Recent;   Fair  Judgement:  Fair  Insight:  Shallow  Psychomotor Activity:  Decreased  Concentration:  Decreased   Recall:  Fair  Fund of Knowledge:Fair  Language: Fair  Akathisia:  No  Handed:  Right  AIMS (if indicated):  not done  Assets:  Desire for Improvement Social Support  ADL's:  Intact  Cognition: WNL  Sleep:  Poor   Screenings: PHQ2-9     Office Visit from 04/25/2019 in [facility] Office Visit from 10/25/2018 in [facility] Office Visit from 08/13/2018 in [facility] Office Visit from 07/24/2018 in [facility] Office Visit from 05/21/2018 in [facility]  PHQ-2 Total Score  2  2  0  0  0  PHQ-9 Total Score  12  9  -  -  -      Assessment and Plan: as follows PTSD: flashbacks spradic and when triggers are there. Continue lexapro and re schedule for therapy  MDD, moderate; somewhat subdued, continue lexapro. Avoid bed during the day and add more activities Sleep study results pending, to eval sleep apnea and to adjust sleep aid if needed If continues  flashbacks may consider minipress.  Not suicidal. Provided supportive therapy  Avoid alcohol and abstain,  Fu 3-4 w Questions addressed, avoid alcohol and he will make early therapy appointment.  I discussed the assessment and treatment plan with the patient. The patient was provided an opportunity to ask questions and all were answered. The patient agreed with the plan and demonstrated an understanding of the instructions.   The patient was advised to call back or seek an in-person evaluation if the symptoms worsen or if the condition fails to improve as anticipated.  Thresa Ross, MD 10/21/20201:24 PM

## 2019-07-24 ENCOUNTER — Encounter (HOSPITAL_COMMUNITY): Payer: Self-pay | Admitting: Licensed Clinical Social Worker

## 2019-07-24 ENCOUNTER — Ambulatory Visit (HOSPITAL_COMMUNITY): Payer: Self-pay | Admitting: Psychiatry

## 2019-07-24 ENCOUNTER — Ambulatory Visit (INDEPENDENT_AMBULATORY_CARE_PROVIDER_SITE_OTHER): Payer: Self-pay | Admitting: Licensed Clinical Social Worker

## 2019-07-24 DIAGNOSIS — F4310 Post-traumatic stress disorder, unspecified: Secondary | ICD-10-CM

## 2019-07-24 NOTE — Progress Notes (Signed)
Virtual Visit via Telephone Note  I connected with Vincent Davis on 07/24/19 at 11:00 AM EDT by telephone and verified that I am speaking with the correct person using two identifiers.  Location: Patient: Home Provider: Office   I discussed the limitations, risks, security and privacy concerns of performing an evaluation and management service by telephone and the availability of in person appointments. I also discussed with the patient that there may be a patient responsible charge related to this service. The patient expressed understanding and agreed to proceed.    I discussed the assessment and treatment plan with the patient. The patient was provided an opportunity to ask questions and all were answered. The patient agreed with the plan and demonstrated an understanding of the instructions.   The patient was advised to call back or seek an in-person evaluation if the symptoms worsen or if the condition fails to improve as anticipated.  I provided 50 minutes of non-face-to-face time during this encounter.   Archie Balboa, LCAS-A    THERAPIST PROGRESS NOTE  Session Time: 11-12  Participation Level: Active  Behavioral Response: Well GroomedAlertEuthymic  Type of Therapy: Individual Therapy  Treatment Goals addressed: Anxiety  Interventions: CBT and Supportive  Summary: Vincent Davis is a 43 y.o. male who presents with PTSD and depressed mood. He is active, engaged, and more agreeable than previous sessions. He reports his medications are helping improve his mood and lessen thoughts of not wanting to be here. He endorses ongoing issues w/ sleep and dissociation. I help psychoeducate PT on PTSD grounding techniques to improve bodily awareness and manage dissociative sxs. PT is agreeable to these. We discuss writing self notes on post its notes, using senses to encourage grounding, muting television when triggering shows come on (from extended TV watching while in the  hospital), and using essential oils to improve breathing and groundling.    Suicidal/Homicidal: Nowithout intent/plan  Therapist Response: I used open questions, active listening, and offered various supportive techniques for managing PTSD sxs.  Plan: Return again in 2 weeks.  Diagnosis:    ICD-10-CM   1. PTSD (post-traumatic stress disorder)  F43.10      Archie Balboa, LCAS-A 07/24/2019

## 2019-07-28 ENCOUNTER — Ambulatory Visit: Payer: Self-pay | Admitting: Family Medicine

## 2019-08-02 DIAGNOSIS — G4730 Sleep apnea, unspecified: Secondary | ICD-10-CM

## 2019-08-02 NOTE — Procedures (Signed)
     Patient Name: Vincent Davis, Vincent Davis Date: 07/21/2019 Gender: Male D.O.B: 1976-07-06 Age (years): 20 Referring Provider: Azzie Glatter FNP Height (inches): 72 Interpreting Physician: Baird Lyons MD, ABSM Weight (lbs): 246 RPSGT: Jacolyn Reedy BMI: 33 MRN: [id number] Neck Size: 18.50  CLINICAL INFORMATION Sleep Study Type: HST Indication for sleep study: OSA Epworth Sleepiness Score: 18  SLEEP STUDY TECHNIQUE A multi-channel overnight portable sleep study was performed. The channels recorded were: nasal airflow, thoracic respiratory movement, and oxygen saturation with a pulse oximetry. Snoring was also monitored.  MEDICATIONS Patient self administered medications include: none reported.  SLEEP ARCHITECTURE Patient was studied for 350.5 minutes. The sleep efficiency was 95.5 % and the patient was supine for 73.9%. The arousal index was 0.0 per hour.  RESPIRATORY PARAMETERS The overall AHI was 2.4 per hour, with a central apnea index of 0.0 per hour. The oxygen nadir was 89% during sleep.  CARDIAC DATA Mean heart rate during sleep was 74.1 bpm.  IMPRESSIONS - No significant obstructive sleep apnea occurred during this study (AHI = 2.4/h). - No significant central sleep apnea occurred during this study (CAI = 0.0/h). - Mild oxygen desaturation was noted during this study (Min O2 = 89%). Mean sat 98%. - Patient snored.  DIAGNOSIS - Normal study  RECOMMENDATIONS - Be careful with alcohol, sedatives and other CNS depressants that may worsen sleep apnea and disrupt normal sleep architecture. - Sleep hygiene should be reviewed to assess factors that may improve sleep quality. - Weight management and regular exercise should be initiated or continued.  [Electronically signed] 08/02/2019 10:51 AM  Baird Lyons MD, ABSM Diplomate, American Board of Sleep Medicine   NPI: [id number]                        [place],  [employer]  ELECTRONICALLY SIGNED ON:  08/02/2019, 10:51 AM [place] PH: [phone]   FX: [phone] [place]

## 2019-08-05 ENCOUNTER — Other Ambulatory Visit: Payer: Self-pay | Admitting: Family Medicine

## 2019-08-05 ENCOUNTER — Ambulatory Visit (INDEPENDENT_AMBULATORY_CARE_PROVIDER_SITE_OTHER): Payer: Self-pay | Admitting: Licensed Clinical Social Worker

## 2019-08-05 ENCOUNTER — Encounter (HOSPITAL_COMMUNITY): Payer: Self-pay | Admitting: Licensed Clinical Social Worker

## 2019-08-05 ENCOUNTER — Other Ambulatory Visit: Payer: Self-pay

## 2019-08-05 ENCOUNTER — Telehealth: Payer: Self-pay | Admitting: Family Medicine

## 2019-08-05 ENCOUNTER — Encounter: Payer: Self-pay | Admitting: Family Medicine

## 2019-08-05 DIAGNOSIS — F331 Major depressive disorder, recurrent, moderate: Secondary | ICD-10-CM

## 2019-08-05 DIAGNOSIS — F4310 Post-traumatic stress disorder, unspecified: Secondary | ICD-10-CM

## 2019-08-05 NOTE — Progress Notes (Signed)
Virtual Visit via Video Note  I connected with Vincent Davis on 08/05/19 at  1:30 PM EST by a video enabled telemedicine application and verified that I am speaking with the correct person using two identifiers.  Location: Patient: Home Provider: Office   I discussed the limitations of evaluation and management by telemedicine and the availability of in person appointments. The patient expressed understanding and agreed to proceed.    I discussed the assessment and treatment plan with the patient. The patient was provided an opportunity to ask questions and all were answered. The patient agreed with the plan and demonstrated an understanding of the instructions.   The patient was advised to call back or seek an in-person evaluation if the symptoms worsen or if the condition fails to improve as anticipated.  I provided 45 minutes of non-face-to-face time during this encounter.   Archie Balboa, LCAS-A    THERAPIST PROGRESS NOTE  Session Time: 1:30-2:15  Participation Level: Active  Behavioral Response: NAAlertEuthymic  Type of Therapy: Individual Therapy  Treatment Goals addressed: Anxiety  Interventions: CBT and Supportive  Summary: Vincent Davis is a 43 y.o. male who presents with PTSD and MDD. He is calm and lucid in session. He asks me to write him a letter in support of his upcoming disability case. We discuss PT's worsening condition and his various PTSD sxs including hypervigilance, irrational fear of going out, and near daily flashbacks. He discusses his hx of childhood trauma which he believes contributes to his current sxs. I continue to offer supportive therapy. I ask PT about his ability to return to work. He states he does not feel he can work due to PTSD and MDD. PT reports he is compliant on his medication.   Suicidal/Homicidal: Nowithout intent/plan  Therapist Response: I offer supportive, open questions, active listening, and teach him  psychoeducational information about living w/ PTSD and coping skills for managing sxs.   Plan: Return again in 1 weeks.  Diagnosis:    ICD-10-CM   1. PTSD (post-traumatic stress disorder)  F43.10   2. MDD (major depressive disorder), recurrent episode, moderate ([place])  F33.1       Archie Balboa, LCAS-A 08/05/2019

## 2019-08-05 NOTE — Telephone Encounter (Signed)
Patient has a court date for disability on 08/15/2019. He is requesting a note to support his claim. Also requesting his sleep study results.   Call back ph# [phone].

## 2019-08-05 NOTE — Telephone Encounter (Signed)
Also a letter from Dr Clovis Riley supporting his disablity claim

## 2019-08-07 ENCOUNTER — Telehealth: Payer: Self-pay

## 2019-08-07 NOTE — Telephone Encounter (Signed)
error 

## 2019-08-07 NOTE — Progress Notes (Signed)
Patient aware of result and letter.

## 2019-08-08 ENCOUNTER — Ambulatory Visit: Payer: Self-pay | Attending: Family Medicine | Admitting: Physical Therapy

## 2019-08-12 ENCOUNTER — Ambulatory Visit (INDEPENDENT_AMBULATORY_CARE_PROVIDER_SITE_OTHER): Payer: Self-pay | Admitting: Licensed Clinical Social Worker

## 2019-08-12 ENCOUNTER — Encounter (HOSPITAL_COMMUNITY): Payer: Self-pay | Admitting: Licensed Clinical Social Worker

## 2019-08-12 ENCOUNTER — Other Ambulatory Visit: Payer: Self-pay | Admitting: Family Medicine

## 2019-08-12 ENCOUNTER — Other Ambulatory Visit: Payer: Self-pay

## 2019-08-12 DIAGNOSIS — F331 Major depressive disorder, recurrent, moderate: Secondary | ICD-10-CM

## 2019-08-12 DIAGNOSIS — F4310 Post-traumatic stress disorder, unspecified: Secondary | ICD-10-CM

## 2019-08-12 MED ORDER — VALACYCLOVIR HCL 1 G PO TABS: 1000.0000 mg | ORAL_TABLET | Freq: Two times a day (BID) | ORAL | 2 refills | Status: AC

## 2019-08-12 MED FILL — valACYclovir HCL 1 GM TABS: 1 | 10 days supply | Qty: 20 | Fill #0

## 2019-08-12 NOTE — Progress Notes (Signed)
Virtual Visit via Video Note  I connected with Vincent Davis on 08/12/19 at  8:00 AM EST by a video enabled telemedicine application and verified that I am speaking with the correct person using two identifiers.  Location: Patient: Home Provider: Office   I discussed the limitations of evaluation and management by telemedicine and the availability of in person appointments. The patient expressed understanding and agreed to proceed.   I discussed the assessment and treatment plan with the patient. The patient was provided an opportunity to ask questions and all were answered. The patient agreed with the plan and demonstrated an understanding of the instructions.   The patient was advised to call back or seek an in-person evaluation if the symptoms worsen or if the condition fails to improve as anticipated.  I provided 55 minutes of non-face-to-face time during this encounter.   Archie Balboa, LCAS-A    THERAPIST PROGRESS NOTE  Session Time: 8-8:55  Participation Level: Active  Behavioral Response: NAAlertIrritable  Type of Therapy: Individual Therapy  Treatment Goals addressed: Anxiety  Interventions: Supportive  Summary: Zaccheus Edmister is a 43 y.o. male who presents with hx of PTSD and MDD. He is active, engaged, and open in our telephone session. I spent time discussing coping skills for managing anxiety, nightmares, and triggers associated w/ his PTSD. I encouraged PT to f/u w/ his lawyer to confirm their receipt of my paperwork for his disability case.    Suicidal/Homicidal: Nowithout intent/plan  Therapist Response: I used open questions, active listening, and taught coping skills for anxiety including guided imagery and grounding techniques and breathing techniques.   Plan: Return again in 2 weeks.  Diagnosis:    ICD-10-CM   1. PTSD (post-traumatic stress disorder)  F43.10   2. MDD (major depressive disorder), recurrent episode, moderate ([place])  F33.1        Archie Balboa, LCAS-A 08/12/2019

## 2019-08-25 ENCOUNTER — Encounter (HOSPITAL_COMMUNITY): Payer: Self-pay | Admitting: Licensed Clinical Social Worker

## 2019-08-25 ENCOUNTER — Ambulatory Visit (INDEPENDENT_AMBULATORY_CARE_PROVIDER_SITE_OTHER): Payer: Self-pay | Admitting: Licensed Clinical Social Worker

## 2019-08-25 DIAGNOSIS — F4310 Post-traumatic stress disorder, unspecified: Secondary | ICD-10-CM

## 2019-08-25 DIAGNOSIS — F331 Major depressive disorder, recurrent, moderate: Secondary | ICD-10-CM

## 2019-08-25 NOTE — Progress Notes (Signed)
Virtual Visit via Telephone Note  I connected with Vincent Davis on 08/25/19 at 10:00 AM EST by telephone and verified that I am speaking with the correct person using two identifiers.  Location: Patient: Home Provider: office   I discussed the limitations, risks, security and privacy concerns of performing an evaluation and management service by telephone and the availability of in person appointments. I also discussed with the patient that there may be a patient responsible charge related to this service. The patient expressed understanding and agreed to proceed.      I discussed the assessment and treatment plan with the patient. The patient was provided an opportunity to ask questions and all were answered. The patient agreed with the plan and demonstrated an understanding of the instructions.   The patient was advised to call back or seek an in-person evaluation if the symptoms worsen or if the condition fails to improve as anticipated.  I provided 55 minutes of non-face-to-face time during this encounter.   Archie Balboa, LCAS-A    THERAPIST PROGRESS NOTE  Session Time: 10-11  Participation Level: Active  Behavioral Response: Well GroomedAlertEuthymic  Type of Therapy: Individual Therapy  Treatment Goals addressed: Anxiety  Interventions: CBT and Motivational Interviewing  Summary: Vincent Davis is a 43 y.o. male who presents with PTSD. He reports his court date went well and he is awaiting response from judge. I spent time asking PT about his childhood and hometown. PT discussed feelings of being taken advantage of by his older siblings who "use his dx to get things from their mother". PT remains hopeful that he will get long-term disability.   Suicidal/Homicidal: Nowithout intent/plan  Therapist Response: I used open questions, active listening and supportive feedback. I inquired about PT [place] and how his family story played  into his identity and values. Pt was open and sharing in session and appeared more comfortable disclosing his feelings than previous sessions.   Plan: Return again in 2 weeks.  Diagnosis:    ICD-10-CM   1. PTSD (post-traumatic stress disorder)  F43.10   2. MDD (major depressive disorder), recurrent episode, moderate ([place])  F33.1       Archie Balboa, LCAS-A 08/25/2019

## 2019-09-02 ENCOUNTER — Ambulatory Visit (HOSPITAL_COMMUNITY): Payer: Self-pay | Admitting: Licensed Clinical Social Worker

## 2019-09-09 ENCOUNTER — Ambulatory Visit (INDEPENDENT_AMBULATORY_CARE_PROVIDER_SITE_OTHER): Payer: Self-pay | Admitting: Licensed Clinical Social Worker

## 2019-09-09 DIAGNOSIS — F331 Major depressive disorder, recurrent, moderate: Secondary | ICD-10-CM

## 2019-09-09 DIAGNOSIS — F4310 Post-traumatic stress disorder, unspecified: Secondary | ICD-10-CM

## 2019-09-09 NOTE — Progress Notes (Signed)
Virtual Visit via Telephone Note  I connected with Vincent Davis on 09/09/19 at 10:00 AM EST by telephone and verified that I am speaking with the correct person using two identifiers.  Location: Patient: Home Provider: Office   I discussed the limitations, risks, security and privacy concerns of performing an evaluation and management service by telephone and the availability of in person appointments. I also discussed with the patient that there may be a patient responsible charge related to this service. The patient expressed understanding and agreed to proceed.     I discussed the assessment and treatment plan with the patient. The patient was provided an opportunity to ask questions and all were answered. The patient agreed with the plan and demonstrated an understanding of the instructions.   The patient was advised to call back or seek an in-person evaluation if the symptoms worsen or if the condition fails to improve as anticipated.  I provided 50 minutes of non-face-to-face time during this encounter.   Archie Balboa, LCAS-A    THERAPIST PROGRESS NOTE  Session Time: 07-1049  Participation Level: Active  Behavioral Response: NAAlertDepressed  Type of Therapy: Individual Therapy  Treatment Goals addressed: Anxiety  Interventions: CBT and Other: IFS  Summary: Vincent Davis is a 43 y.o. male who presents with PTSD. He is alert and less talkative than normal. He is awaiting information on his approval for disability. We spent time discussing PT's memory and experience of his traumatic event. I ask PT to talk w/ "parts" of himself and see how he reacts/feels. PT is agreeable to this form of IFS therapy and states that he can remember wanting to live while experiencing the "out of body" experience. He wanted to live for "his son" since his son lost his mother. I encourage PT to practice neutrality towards his sxs of PTSD rather than disdain for him memories and  panic. PT is understanding of this. PT states he feels he went through the trauma for a reason.  Suicidal/Homicidal: Nowithout intent/plan  Therapist Response: I used open questions, active listening, and IFS therapy model to help PT learn neutrality and curiosity towards his more damaged parts.   Plan: Return again in 3 weeks.  Diagnosis:    ICD-10-CM   1. PTSD (post-traumatic stress disorder)  F43.10   2. MDD (major depressive disorder), recurrent episode, moderate ([place])  F33.1      Archie Balboa, LCAS-A 09/09/2019

## 2019-09-15 ENCOUNTER — Other Ambulatory Visit: Payer: Self-pay | Admitting: Family Medicine

## 2019-09-15 ENCOUNTER — Encounter: Payer: Self-pay | Admitting: Family Medicine

## 2019-09-16 ENCOUNTER — Other Ambulatory Visit: Payer: Self-pay | Admitting: Family Medicine

## 2019-09-19 ENCOUNTER — Encounter (HOSPITAL_COMMUNITY): Payer: Self-pay | Admitting: Psychiatry

## 2019-09-19 ENCOUNTER — Other Ambulatory Visit: Payer: Self-pay

## 2019-09-19 ENCOUNTER — Ambulatory Visit (INDEPENDENT_AMBULATORY_CARE_PROVIDER_SITE_OTHER): Payer: Self-pay | Admitting: Psychiatry

## 2019-09-19 DIAGNOSIS — F331 Major depressive disorder, recurrent, moderate: Secondary | ICD-10-CM

## 2019-09-19 DIAGNOSIS — G4700 Insomnia, unspecified: Secondary | ICD-10-CM

## 2019-09-19 DIAGNOSIS — F419 Anxiety disorder, unspecified: Secondary | ICD-10-CM

## 2019-09-19 DIAGNOSIS — F4310 Post-traumatic stress disorder, unspecified: Secondary | ICD-10-CM

## 2019-09-19 MED ORDER — ESCITALOPRAM OXALATE 20 MG PO TABS: 40.0000 mg | ORAL_TABLET | Freq: Every day | ORAL | 1 refills | Status: DC

## 2019-09-19 MED FILL — ESCITALOPRAM 20 MG TABLET: 20 | 30 days supply | Qty: 60 | Fill #0

## 2019-09-19 NOTE — Progress Notes (Signed)
[facility] Outpatient Follow up visit   Patient Identification: Vincent Davis MRN:  [id number] Date of Evaluation:  09/19/2019 Referral Source: Therapist Chief Complaint:   anxiety and ptsd follow up Visit Diagnosis:    ICD-10-CM   1. PTSD (post-traumatic stress disorder)  F43.10   2. MDD (major depressive disorder), recurrent episode, moderate ([facility])  F33.1   3. Posttraumatic stress disorder  F43.10   4. Anxiety  F41.9   5. Insomnia, unspecified type  G47.00         I connected with Vincent Davis on 09/19/19 at  9:00 AM EST by telephone and verified that I am speaking with the correct person using two identifiers.   I discussed the limitations, risks, security and privacy concerns of performing an evaluation and management service by telephone and the availability of in person appointments. I also discussed with the patient that there may be a patient responsible charge related to this service. The patient expressed understanding and agreed to proceed.      History of Present Illness: 43 years old currently single African-American male living with his girlfriend, initially referred by his therapist for management of PTSD and depression  Patient had an incident postop  in December 2018 when he went for a cervical surgery C6-C7 "review prior notes if needed.  Has flashbacks, working in therapy sometime feel nightmares wakes him up Has applied for disability  In home sleep study was inconclusive, he says not ready for full sleep study lexapro helps some, feels not too overwhelemed GF left, says she would bicker too much and was stresful  Triggers make him upset    States not be drinking or using marijuana .  He understands not to drink  No side effects reported  Modifying factors" , Mom but she lives away Duration since December 2018  No prior past psychiatric history before December 2018   Past Psychiatric History: denies prior treatment before  2018  Previous Psychotropic Medications: No   Substance Abuse History in the last 12 months:  No.  Consequences of Substance Abuse: NA  Past Medical History:  Past Medical History:  Diagnosis Date  . Anxiety   . 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 Cervical spondylosis with radiculopathy   . Complication of anesthesia    no previous surgery  . Family history of adverse reaction to anesthesia    "I don't know."  . H/O cervical discectomy   . Obesity (BMI 30.0-34.9)   . Pain and numbness of left upper extremity   . Pneumonia    as a child  . PTSD (post-traumatic stress disorder)     Past Surgical History:  Procedure Laterality Date  . ANTERIOR CERVICAL DECOMP/DISCECTOMY FUSION N/A 09/03/2017   Procedure: CERVICAL SIX-SEVEN  ANTERIOR CERVICAL DISCECTOMY AND FUSION, ALLOGRAFT, PLATE;  Surgeon: Marybelle Killings, MD;  Location: [facility];  Service: Orthopedics;  Laterality: N/A;  . EVACUATION OF CERVICAL HEMATOMA N/A 09/07/2017   Procedure: EVACUATION OF NECK HEMATOMA;  Surgeon: Marybelle Killings, MD;  Location: [facility];  Service: Orthopedics;  Laterality: N/A;  . NO PAST SURGERIES      Family Psychiatric History: denies, but says most of them may have some . coudlnt elaborate any more  Family History:  Family History  Problem Relation Age of Onset  . Other Mother        Healthy    Social History:   Social History   Socioeconomic History  . Marital status: Single  Spouse name: Not on file  . Number of children: 2  . Years of education: some college  . Highest education level: Not on file  Occupational History    Comment: NA  Tobacco Use  . Smoking status: Former Smoker    Types: Cigarettes  . Smokeless tobacco: Never Used  Substance and Sexual Activity  . Alcohol use: Yes    Comment: occasional  . Drug use: Yes    Types: Marijuana    Comment: last use 08/29/17  . Sexual activity: Not on file  Other Topics Concern  . Not on file  Social History  Narrative   Lives with sig other   Caffeine- not daily   Social Determinants of Health   Financial Resource Strain:   . Difficulty of Paying Living Expenses: Not on file  Food Insecurity:   . Worried About Programme researcher, broadcasting/film/video in the Last Year: Not on file  . Ran Out of Food in the Last Year: Not on file  Transportation Needs:   . Lack of Transportation (Medical): Not on file  . Lack of Transportation (Non-Medical): Not on file  Physical Activity:   . Days of Exercise per Week: Not on file  . Minutes of Exercise per Session: Not on file  Stress:   . Feeling of Stress : Not on file  Social Connections:   . Frequency of Communication with Friends and Family: Not on file  . Frequency of Social Gatherings with Friends and Family: Not on file  . Attends Religious Services: Not on file  . Active Member of Clubs or Organizations: Not on file  . Attends Banker Meetings: Not on file  . Marital Status: Not on file    Allergies:  No Known Allergies  Metabolic Disorder Labs: Lab Results  Component Value Date   HGBA1C 5.3 10/25/2018   No results found for: PROLACTIN Lab Results  Component Value Date   CHOL 181 04/15/2018   TRIG 130 04/15/2018   HDL 63 04/15/2018   CHOLHDL 2.9 04/15/2018   LDLCALC 92 04/15/2018   Lab Results  Component Value Date   TSH 1.320 04/15/2018    Therapeutic Level Labs: No results found for: LITHIUM No results found for: CBMZ No results found for: VALPROATE  Current Medications: Current Outpatient Medications  Medication Sig Dispense Refill  . albuterol (VENTOLIN HFA) 108 (90 Base) MCG/ACT inhaler Inhale 2 puffs into the lungs every 6 (six) hours as needed for wheezing or shortness of breath. 8 g 2  . aspirin EC 81 MG tablet Take 81 mg by mouth daily.    Marland Kitchen escitalopram (LEXAPRO) 20 MG tablet Take 2 tablets (40 mg total) by mouth daily. 60 tablet 1  . levocetirizine (XYZAL) 5 MG tablet Take 5 mg by mouth daily as needed for  allergies.    . meloxicam (MOBIC) 15 MG tablet Take 1 tablet (15 mg total) by mouth daily. 30 tablet 3  . traMADol (ULTRAM) 50 MG tablet Take 50 mg by mouth every 6 (six) hours as needed.    . traZODone (DESYREL) 100 MG tablet Take 1 tablet (100 mg total) by mouth at bedtime as needed for sleep. 30 tablet 2  . valACYclovir (VALTREX) 1000 MG tablet Take 1 tablet (1,000 mg total) by mouth 2 (two) times daily. As needed. 20 tablet 2   No current facility-administered medications for this visit.      Psychiatric Specialty Exam: Review of Systems  Skin: Negative for rash.  Psychiatric/Behavioral:  The patient has insomnia.     There were no vitals taken for this visit.There is no height or weight on file to calculate BMI.  General Appearance:   Eye Contact:  Fair  Speech:  Slow  Volume:  Decreased  Mood:  Somewhat subdued  Affect:  Constricted  Thought Process:  Goal Directed  Orientation:  Full (Time, Place, and Person)  Hallucinations as if a shadow at the corner, whisteling sounds at time  Suicidal Thoughts:  No  Homicidal Thoughts:  No  Memory:  Immediate;   Fair Recent;   Fair  Judgement:  Fair  Insight:  Shallow  Psychomotor Activity:  Decreased  Concentration:  Decreased   Recall:  Fair  Fund of Knowledge:Fair  Language: Fair  Akathisia:  No  Handed:  Right  AIMS (if indicated):  not done  Assets:  Desire for Improvement Social Support  ADL's:  Intact  Cognition: WNL  Sleep:  Poor   Screenings: PHQ2-9     Office Visit from 04/25/2019 in [facility] Office Visit from 10/25/2018 in [facility] Office Visit from 08/13/2018 in [facility] Office Visit from 07/24/2018 in [facility] Office Visit from 05/21/2018 in [facility]  PHQ-2 Total Score  2  2  0  0  0  PHQ-9 Total Score  12  9  --  --  --      Assessment and Plan: as follows PTSD: flashbacks sporadic, continue  therapy and lexapro MDD, moderate; somewhat subdued, continue lexapro. Avoid bed during the day and add more activities Sleep study results inconclusive, discussed to get full sleep study when feels comfortable Not suicidal. Provided supportive therapy  Avoid alcohol and abstain,  Fu 3-4 w Questions addressed, avoid alcohol and he will make early therapy appointment.  I discussed the assessment and treatment plan with the patient. The patient was provided an opportunity to ask questions and all were answered. The patient agreed with the plan and demonstrated an understanding of the instructions.   The patient was advised to call back or seek an in-person evaluation if the symptoms worsen or if the condition fails to improve as anticipated.  Thresa RossNadeem Sekai Gitlin, MD 12/18/20209:17 AM

## 2019-10-03 DIAGNOSIS — E559 Vitamin D deficiency, unspecified: Secondary | ICD-10-CM

## 2019-10-03 HISTORY — DX: Vitamin D deficiency, unspecified: E55.9

## 2019-10-06 ENCOUNTER — Ambulatory Visit (INDEPENDENT_AMBULATORY_CARE_PROVIDER_SITE_OTHER): Payer: Self-pay | Admitting: Licensed Clinical Social Worker

## 2019-10-06 DIAGNOSIS — F4310 Post-traumatic stress disorder, unspecified: Secondary | ICD-10-CM

## 2019-10-06 NOTE — Progress Notes (Signed)
PT was called and answered at 11:07 AM. PT reports he was just waking up from a nap and need time to get ready and he will call back before end of session. PT hung up at around 11:10 and did not return call. I called PT at 4PM to inquire about his session. I alerted him about a getting a "no show" charge for today and the need for him to be more punctual if he intends to continue counseling. PT explained he feels he does need more counseling and agreed he could go down to once per month. PT agreed to call [facility] to reschedule for another appointment at a later time.

## 2019-10-15 ENCOUNTER — Other Ambulatory Visit: Payer: Self-pay

## 2019-10-15 ENCOUNTER — Ambulatory Visit (INDEPENDENT_AMBULATORY_CARE_PROVIDER_SITE_OTHER): Payer: Self-pay | Admitting: Family Medicine

## 2019-10-15 ENCOUNTER — Encounter: Payer: Self-pay | Admitting: Family Medicine

## 2019-10-15 VITALS — BP 114/70 | HR 60 | Temp 98.2°F | Ht 71.0 in | Wt 242.0 lb

## 2019-10-15 DIAGNOSIS — F4310 Post-traumatic stress disorder, unspecified: Secondary | ICD-10-CM

## 2019-10-15 DIAGNOSIS — M79602 Pain in left arm: Secondary | ICD-10-CM

## 2019-10-15 DIAGNOSIS — R200 Anesthesia of skin: Secondary | ICD-10-CM

## 2019-10-15 DIAGNOSIS — I10 Essential (primary) hypertension: Secondary | ICD-10-CM

## 2019-10-15 DIAGNOSIS — R202 Paresthesia of skin: Secondary | ICD-10-CM

## 2019-10-15 DIAGNOSIS — G8929 Other chronic pain: Secondary | ICD-10-CM

## 2019-10-15 DIAGNOSIS — Z09 Encounter for follow-up examination after completed treatment for conditions other than malignant neoplasm: Secondary | ICD-10-CM

## 2019-10-15 DIAGNOSIS — M549 Dorsalgia, unspecified: Secondary | ICD-10-CM

## 2019-10-15 DIAGNOSIS — F419 Anxiety disorder, unspecified: Secondary | ICD-10-CM

## 2019-10-15 LAB — POCT URINALYSIS DIPSTICK
Bilirubin, UA: NEGATIVE
Glucose, UA: NEGATIVE
Ketones, UA: NEGATIVE
Leukocytes, UA: NEGATIVE
Nitrite, UA: NEGATIVE
Protein, UA: NEGATIVE
Spec Grav, UA: >=1.03 — AB (ref 1.010–1.025)
Urobilinogen, UA: 0.2 E.U./dL
pH, UA: 5.5 (ref 5.0–8.0)

## 2019-10-15 NOTE — Progress Notes (Signed)
Patient [facility]   Established Patient Office Visit  Subjective:  Patient ID: Vincent Davis, male    DOB: 11-Nov-1975  Age: 44 y.o. MRN: [id number]  CC:  Chief Complaint  Patient presents with  . Follow-up    htn, still arm pain, problem sleeping    HPI [name] is a 44 year old male who presents for Follow Up today.   Past Medical History:  Diagnosis Date  . Anxiety   . 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 Cervical spondylosis with radiculopathy   . Complication of anesthesia    no previous surgery  . Family history of adverse reaction to anesthesia    "I don't know."  . H/O cervical discectomy   . Obesity (BMI 30.0-34.9)   . Pain and numbness of left upper extremity   . Pneumonia    as a child  . PTSD (post-traumatic stress disorder)    Current Status: Since his last office visit, he is doing well with no complaints. His anxiety is mild today. He denies suicidal ideations, homicidal ideations, or auditory hallucinations. He continues to follow up with Psychiatry as needed. His last office visit was on 09/19/2019. He continues have left arm and back pain. He is not able to stand or sit for long periods of time without experiencing pain and numbness. He denies fevers, chills, fatigue, recent infections, weight loss, and night sweats. He has not had any headaches, visual changes, dizziness, and falls. No chest pain, heart palpitations, cough and shortness of breath reported. No reports of GI problems such as nausea, vomiting, diarrhea, and constipation. He has no reports of blood in stools, dysuria and hematuria.   Past Surgical History:  Procedure Laterality Date  . ANTERIOR CERVICAL DECOMP/DISCECTOMY FUSION N/A 09/03/2017   Procedure: CERVICAL SIX-SEVEN  ANTERIOR CERVICAL DISCECTOMY AND FUSION, ALLOGRAFT, PLATE;  Surgeon: Marybelle Killings, MD;  Location: [facility];  Service: Orthopedics;  Laterality: N/A;   . EVACUATION OF CERVICAL HEMATOMA N/A 09/07/2017   Procedure: EVACUATION OF NECK HEMATOMA;  Surgeon: Marybelle Killings, MD;  Location: [facility];  Service: Orthopedics;  Laterality: N/A;  . NO PAST SURGERIES      Family History  Problem Relation Age of Onset  . Other Mother        Healthy    Social History   Socioeconomic History  . Marital status: Single    Spouse name: Not on file  . Number of children: 2  . Years of education: some college  . Highest education level: Not on file  Occupational History    Comment: NA  Tobacco Use  . Smoking status: Former Smoker    Types: Cigarettes  . Smokeless tobacco: Never Used  Substance and Sexual Activity  . Alcohol use: Yes    Comment: occasional  . Drug use: Not Currently    Types: Marijuana    Comment: last use 08/29/17  . Sexual activity: Not Currently  Other Topics Concern  . Not on file  Social History Narrative   Lives with sig other   Caffeine- not daily   Social Determinants of Health   Financial Resource Strain:   . Difficulty of Paying Living Expenses: Not on file  Food Insecurity:   . Worried About Charity fundraiser in the Last Year: Not on file  . Ran Out of Food in the Last Year: Not on file  Transportation Needs:   .  Lack of Transportation (Medical): Not on file  . Lack of Transportation (Non-Medical): Not on file  Physical Activity:   . Days of Exercise per Week: Not on file  . Minutes of Exercise per Session: Not on file  Stress:   . Feeling of Stress : Not on file  Social Connections:   . Frequency of Communication with Friends and Family: Not on file  . Frequency of Social Gatherings with Friends and Family: Not on file  . Attends Religious Services: Not on file  . Active Member of Clubs or Organizations: Not on file  . Attends Archivist Meetings: Not on file  . Marital Status: Not on file  Intimate Partner Violence:   . Fear of Current or Ex-Partner: Not on file  . Emotionally Abused: Not  on file  . Physically Abused: Not on file  . Sexually Abused: Not on file    Outpatient Medications Prior to Visit  Medication Sig Dispense Refill  . albuterol (VENTOLIN HFA) 108 (90 Base) MCG/ACT inhaler Inhale 2 puffs into the lungs every 6 (six) hours as needed for wheezing or shortness of breath. 8 g 2  . escitalopram (LEXAPRO) 20 MG tablet Take 2 tablets (40 mg total) by mouth daily. 60 tablet 1  . levocetirizine (XYZAL) 5 MG tablet Take 5 mg by mouth daily as needed for allergies.    . meloxicam (MOBIC) 15 MG tablet Take 1 tablet (15 mg total) by mouth daily. 30 tablet 3  . traZODone (DESYREL) 100 MG tablet Take 1 tablet (100 mg total) by mouth at bedtime as needed for sleep. 30 tablet 2  . aspirin EC 81 MG tablet Take 81 mg by mouth daily.    . valACYclovir (VALTREX) 1000 MG tablet Take 1 tablet (1,000 mg total) by mouth 2 (two) times daily. As needed. (Patient not taking: Reported on 10/15/2019) 20 tablet 2  . traMADol (ULTRAM) 50 MG tablet Take 50 mg by mouth every 6 (six) hours as needed.     No facility-administered medications prior to visit.    No Known Allergies  ROS Review of Systems  Constitutional: Negative.   HENT: Negative.   Eyes: Negative.   Respiratory: Negative.   Cardiovascular: Negative.   Gastrointestinal: Negative.   Endocrine: Negative.   Genitourinary: Negative.   Musculoskeletal: Positive for arthralgias (generalized).  Skin: Negative.   Neurological: Positive for dizziness (occasional ), weakness (left upper extremity) and headaches (occasional).  Hematological: Negative.   Psychiatric/Behavioral: Negative.       Objective:    Physical Exam  Constitutional: He is oriented to person, place, and time. He appears well-developed and well-nourished.  HENT:  Head: Normocephalic and atraumatic.  Eyes: Conjunctivae are normal.  Neck:  Limited ROM in neck  Cardiovascular: Normal rate, regular rhythm, normal heart sounds and intact distal pulses.   Pulmonary/Chest: Effort normal and breath sounds normal.  Abdominal: Soft. Bowel sounds are normal.  Musculoskeletal:     Cervical back: Neck supple.     Comments: Weakness, pain and numbness in upper left extremity.   Neurological: He is alert and oriented to person, place, and time. He has normal reflexes.  Skin: Skin is warm and dry.  Psychiatric: He has a normal mood and affect. His behavior is normal. Judgment and thought content normal.  Vitals reviewed.   BP 114/70   Pulse 60   Temp 98.2 F (36.8 C) (Oral)   Ht 5' 11"  (1.803 m)   Wt 242 lb (109.8 kg)  SpO2 95%   BMI 33.75 kg/m  Wt Readings from Last 3 Encounters:  10/15/19 242 lb (109.8 kg)  07/21/19 246 lb (111.6 kg)  07/15/19 246 lb 6.4 oz (111.8 kg)     Health Maintenance Due  Topic Date Due  . INFLUENZA VACCINE  05/03/2019    There are no preventive care reminders to display for this patient.  Lab Results  Component Value Date   TSH 1.190 10/15/2019   Lab Results  Component Value Date   WBC 12.1 (H) 10/15/2019   HGB 15.2 10/15/2019   HCT 46.0 10/15/2019   MCV 90 10/15/2019   PLT 321 10/15/2019   Lab Results  Component Value Date   NA 135 10/15/2019   K 4.3 10/15/2019   CO2 24 10/15/2019   GLUCOSE 109 (H) 10/15/2019   BUN 8 10/15/2019   CREATININE 1.11 10/15/2019   BILITOT 0.5 10/15/2019   ALKPHOS 50 10/15/2019   AST 15 10/15/2019   ALT 11 10/15/2019   PROT 7.2 10/15/2019   ALBUMIN 4.5 10/15/2019   CALCIUM 9.2 10/15/2019   ANIONGAP 14 11/03/2018   Lab Results  Component Value Date   CHOL 142 10/15/2019   Lab Results  Component Value Date   HDL 64 10/15/2019   Lab Results  Component Value Date   LDLCALC 54 10/15/2019   Lab Results  Component Value Date   TRIG 142 10/15/2019   Lab Results  Component Value Date   CHOLHDL 2.2 10/15/2019   Lab Results  Component Value Date   HGBA1C 5.3 10/25/2018      Assessment & Plan:   1. Essential hypertension The current medical  regimen is effective; blood pressure is stable at 114/70 today; continue present plan and medications as prescribed. He will continue to take medications as prescribed, to decrease high sodium intake, excessive alcohol intake, increase potassium intake, smoking cessation, and increase physical activity of at least 30 minutes of cardio activity daily. He will continue to follow Heart Healthy or DASH diet. - CBC with Differential - Comp Met (CMET) - TSH - Lipid Panel - Vitamin B12 - Vitamin D, 25-hydroxy - Urinalysis Dipstick  2. Anxiety  3. Chronic back pain, unspecified back location, unspecified back pain laterality Stable today.   4. Left arm pain Stable. Not worsening.   5. Numbness and tingling Left upper arm extremity. Stable today.   6. PTSD (post-traumatic stress disorder) He continues to follow up with Psychiatry once a month and as needed.   7. Follow up He will follow up in 4 months.   No orders of the defined types were placed in this encounter.   Orders Placed This Encounter  Procedures  . CBC with Differential  . Comp Met (CMET)  . TSH  . Lipid Panel  . Vitamin B12  . Vitamin D, 25-hydroxy  . Urinalysis Dipstick    Referral Orders  No referral(s) requested today    Kathe Becton,  MSN, FNP-BC [place] [address] [place], [place] [address] [phone]- fax   Problem List Items Addressed This Visit      Cardiovascular and Mediastinum   Essential hypertension - Primary   Relevant Orders   CBC with Differential (Completed)   Comp Met (CMET) (Completed)   TSH (Completed)   Lipid Panel (Completed)   Vitamin B12 (Completed)   Vitamin D, 25-hydroxy (Completed)   Urinalysis Dipstick (Completed)     Other  Anxiety   Numbness and tingling   PTSD (post-traumatic stress disorder)    Other Visit Diagnoses    Chronic back pain, unspecified back location,  unspecified back pain laterality       Left arm pain       Follow up          No orders of the defined types were placed in this encounter.   Follow-up: Return in about 4 months (around 02/12/2020).    Azzie Glatter, FNP

## 2019-10-16 LAB — CBC WITH DIFFERENTIAL/PLATELET
Basophils Absolute: 0.1 10*3/uL (ref 0.0–0.2)
Basos: 1 %
EOS (ABSOLUTE): 0.2 10*3/uL (ref 0.0–0.4)
Eos: 2 %
Hematocrit: 46 % (ref 37.5–51.0)
Hemoglobin: 15.2 g/dL (ref 13.0–17.7)
Immature Grans (Abs): 0.1 10*3/uL (ref 0.0–0.1)
Immature Granulocytes: 1 %
Lymphocytes Absolute: 2.2 10*3/uL (ref 0.7–3.1)
Lymphs: 18 %
MCH: 29.6 pg (ref 26.6–33.0)
MCHC: 33 g/dL (ref 31.5–35.7)
MCV: 90 fL (ref 79–97)
Monocytes Absolute: 1.1 10*3/uL — ABNORMAL HIGH (ref 0.1–0.9)
Monocytes: 9 %
Neutrophils Absolute: 8.5 10*3/uL — ABNORMAL HIGH (ref 1.4–7.0)
Neutrophils: 69 %
Platelets: 321 10*3/uL (ref 150–450)
RBC: 5.14 x10E6/uL (ref 4.14–5.80)
RDW: 11.4 % — ABNORMAL LOW (ref 11.6–15.4)
WBC: 12.1 10*3/uL — ABNORMAL HIGH (ref 3.4–10.8)

## 2019-10-16 LAB — COMPREHENSIVE METABOLIC PANEL
ALT: 11 IU/L (ref 0–44)
AST: 15 IU/L (ref 0–40)
Albumin/Globulin Ratio: 1.7 (ref 1.2–2.2)
Albumin: 4.5 g/dL (ref 4.0–5.0)
Alkaline Phosphatase: 50 IU/L (ref 39–117)
BUN/Creatinine Ratio: 7 — ABNORMAL LOW (ref 9–20)
BUN: 8 mg/dL (ref 6–24)
Bilirubin Total: 0.5 mg/dL (ref 0.0–1.2)
CO2: 24 mmol/L (ref 20–29)
Calcium: 9.2 mg/dL (ref 8.7–10.2)
Chloride: 101 mmol/L (ref 96–106)
Creatinine, Ser: 1.11 mg/dL (ref 0.76–1.27)
GFR calc Af Amer: 94 mL/min/{1.73_m2} (ref >59)
GFR calc non Af Amer: 81 mL/min/{1.73_m2} (ref >59)
Globulin, Total: 2.7 g/dL (ref 1.5–4.5)
Glucose: 109 mg/dL — ABNORMAL HIGH (ref 65–99)
Potassium: 4.3 mmol/L (ref 3.5–5.2)
Sodium: 135 mmol/L (ref 134–144)
Total Protein: 7.2 g/dL (ref 6.0–8.5)

## 2019-10-16 LAB — LIPID PANEL
Chol/HDL Ratio: 2.2 ratio (ref 0.0–5.0)
Cholesterol, Total: 142 mg/dL (ref 100–199)
HDL: 64 mg/dL (ref >39)
LDL Chol Calc (NIH): 54 mg/dL (ref 0–99)
Triglycerides: 142 mg/dL (ref 0–149)
VLDL Cholesterol Cal: 24 mg/dL (ref 5–40)

## 2019-10-16 LAB — VITAMIN B12: Vitamin B-12: 273 pg/mL (ref 232–1245)

## 2019-10-16 LAB — VITAMIN D 25 HYDROXY (VIT D DEFICIENCY, FRACTURES): Vit D, 25-Hydroxy: 5.8 ng/mL — ABNORMAL LOW (ref 30.0–100.0)

## 2019-10-16 LAB — TSH: TSH: 1.19 u[IU]/mL (ref 0.450–4.500)

## 2019-10-20 ENCOUNTER — Encounter: Payer: Self-pay | Admitting: Family Medicine

## 2019-10-20 ENCOUNTER — Other Ambulatory Visit: Payer: Self-pay | Admitting: Family Medicine

## 2019-10-20 DIAGNOSIS — E559 Vitamin D deficiency, unspecified: Secondary | ICD-10-CM

## 2019-10-20 MED ORDER — VITAMIN D (ERGOCALCIFEROL) 1.25 MG (50000 UNIT) PO CAPS: 50000.0000 [IU] | ORAL_CAPSULE | ORAL | 6 refills | Status: AC

## 2019-10-20 MED FILL — VIT D2 1.25 MG (50,000 UNIT: 1.25 MG | 35 days supply | Qty: 5 | Fill #0

## 2019-10-22 ENCOUNTER — Telehealth: Payer: Self-pay | Admitting: Family Medicine

## 2019-10-22 NOTE — Telephone Encounter (Signed)
Vincent Davis would like to speak to you  about his disability case. He would like  another letter. To explain  his situation in more detail.  No other information given.

## 2019-10-27 ENCOUNTER — Encounter (HOSPITAL_COMMUNITY): Payer: Self-pay | Admitting: Licensed Clinical Social Worker

## 2019-10-27 ENCOUNTER — Ambulatory Visit (INDEPENDENT_AMBULATORY_CARE_PROVIDER_SITE_OTHER): Payer: Self-pay | Admitting: Licensed Clinical Social Worker

## 2019-10-27 ENCOUNTER — Other Ambulatory Visit: Payer: Self-pay

## 2019-10-27 DIAGNOSIS — F419 Anxiety disorder, unspecified: Secondary | ICD-10-CM

## 2019-10-27 DIAGNOSIS — F331 Major depressive disorder, recurrent, moderate: Secondary | ICD-10-CM

## 2019-10-27 DIAGNOSIS — F4310 Post-traumatic stress disorder, unspecified: Secondary | ICD-10-CM

## 2019-10-27 NOTE — Progress Notes (Signed)
Virtual Visit via Video Note  I connected with Vincent Davis on 10/27/19 at  3:30 PM EST by a video enabled telemedicine application and verified that I am speaking with the correct person using two identifiers.  Location: Patient: Home Provider: Office   I discussed the limitations of evaluation and management by telemedicine and the availability of in person appointments. The patient expressed understanding and agreed to proceed.    I discussed the assessment and treatment plan with the patient. The patient was provided an opportunity to ask questions and all were answered. The patient agreed with the plan and demonstrated an understanding of the instructions.   The patient was advised to call back or seek an in-person evaluation if the symptoms worsen or if the condition fails to improve as anticipated.  I provided 50 minutes of non-face-to-face time during this encounter.   [name], LCAS-A    THERAPIST PROGRESS NOTE  Session Time: [phone]  Participation Level: Active  Behavioral Response: NAAlert and LethargicDepressed and Dysphoric  Type of Therapy: Individual Therapy  Treatment Goals addressed: Anxiety  Interventions: CBT and Supportive  Summary: Vincent Davis is a 44 y.o. male who presents with hx of trauma suffered from medical emergency while in hospital in November 2018 w/ subsequent PTSD, Anxiety, and MDD. He reports on his recent legal proceedings w/ his lawyer regarding his disability claim. PT states he is especially "down" today since he has not gotten the response from judge he was hoping for. I help PT to strategize and plan for his options for continued therapy and possibly starting a part time job.   Suicidal/Homicidal: Nowithout intent/plan  Therapist Response: I used open questions, active listening, and encouragement. I offered coping skills for managing anxiety and depression.   Plan: Return again in 4 weeks.  Diagnosis:   ICD-10-CM   1. PTSD (post-traumatic stress disorder)  F43.10   2. MDD (major depressive disorder), recurrent episode, moderate (HCC)  F33.1   3. Anxiety  F41.9       [name], LCAS-A 10/27/2019

## 2019-10-27 NOTE — Progress Notes (Signed)
Vincent Davis i     [name], LCAS-A

## 2019-11-07 ENCOUNTER — Other Ambulatory Visit: Payer: Self-pay | Admitting: Family Medicine

## 2019-11-07 ENCOUNTER — Telehealth: Payer: Self-pay

## 2019-11-07 NOTE — Progress Notes (Signed)
Letter of disability written today.

## 2019-11-07 NOTE — Telephone Encounter (Signed)
Patient aware his letter is ready for pick up

## 2019-11-11 ENCOUNTER — Ambulatory Visit (INDEPENDENT_AMBULATORY_CARE_PROVIDER_SITE_OTHER): Payer: Self-pay | Admitting: Licensed Clinical Social Worker

## 2019-11-11 DIAGNOSIS — F4310 Post-traumatic stress disorder, unspecified: Secondary | ICD-10-CM

## 2019-11-11 NOTE — Progress Notes (Signed)
Virtual Visit via Telephone Note  I connected with Vincent Davis on 11/11/19 at  9:00 AM EST by telephone and verified that I am speaking with the correct person using two identifiers.  Location: Patient: Home  Provider: Office    I discussed the limitations, risks, security and privacy concerns of performing an evaluation and management service by telephone and the availability of in person appointments. I also discussed with the patient that there may be a patient responsible charge related to this service. The patient expressed understanding and agreed to proceed.   THERAPIST PROGRESS NOTE  Session Time: 9:00 am-9:45 am  Participation Level: Active  Behavioral Response: CasualAlertAnxious  Type of Therapy: Individual Therapy  Treatment Goals addressed: Coping  Interventions: CBT and Solution Focused  Case Summary: Vincent Davis is a 44 y.o. male who presents oriented x4 (person, place, situation, time and object), casually dressed, appropriately groomed, average height, average weight, and cooperative to address mood and anxiety. Patient has has a history of medical treatment including neck and arm pain, and blood clot. Patient has a history of mental health treatment including outpatient therapy and medication management. Patient denies suicidal and homicidal ideations. Patient admits to psychosis including hearing whispers and seeing shadow figures. Patient denies substance abuse. Patient is at low risk for lethality.   Session#1  Physically: Patient is concerned about a "bump" on his chest from the procedure he had back in 2018. He feels like this caused physical "damage" as well as mental. Patient had a nerve and neck injury that was causing him pain as well as numbness. Patient's pain has increased. His sleep is disrupted due to nightmares.  Spiritually/values: No issues identified.  Relationships: Patient has a girlfriend that he has been with for 3 years.  Patient has children and wants to be around for his grandchildren.   Emotionally/Mentally/Behavior: Patient shared his experience where he went in for a procedure, had a blood clot, and has been negatively impacted since. Patient shared the experience of leaving his body, seeing relatives that have passed, and being given the option to return to his body. Patient said that he made the choice to return to his body due to having life left to live. He wants to leave a legacy for his children and grandchildren. Patient feels like he has two birthdays: his actual birthday and the day his heart stopped while in the hospital. Patient wants to improve his sleep, mood, and manage his stress.   Patient engaged in session. He responded well to interventions. Patient continues to meet criteria for PTSD. Patient will continue in outpatient therapy due to being the least restrictive service to meet his needs at this time. Patient made no progress on his goals at this time.   Suicidal/Homicidal: Negativewithout intent/plan  Therapist Response: Therapist reviewed patient's recent thoughts and behaviors. Therapist utilized CBT to address mood and anxiety. Therapist reviewed patient's treatment history and assessed for current symptoms. Therapist completed treatment plan with patient.   Plan: Return again in 2 weeks.Review treatment plan on or before 05.09.221  Diagnosis: Axis I: Post Traumatic Stress Disorder    Axis II: No diagnosis  I discussed the assessment and treatment plan with the patient. The patient was provided an opportunity to ask questions and all were answered. The patient agreed with the plan and demonstrated an understanding of the instructions.   The patient was advised to call back or seek an in-person evaluation if the symptoms worsen or if the condition fails  to improve as anticipated.  I provided 45 minutes of non-face-to-face time during this encounter.  Glori Bickers, LCSW 11/11/2019

## 2019-11-21 MED FILL — traZODone HCL 100 MG TABS: 100 | 30 days supply | Qty: 30 | Fill #1

## 2019-11-21 MED FILL — ALBUTEROL SULFATE HFA 108 (: 108 (90 BAS | 25 days supply | Qty: 18 | Fill #2

## 2019-11-21 MED FILL — valACYclovir HCL 1 GM TABS: 1 | 10 days supply | Qty: 20 | Fill #0

## 2019-11-21 MED FILL — VIT D2 1.25 MG (50,000 UNIT: 1.25 MG | 35 days supply | Qty: 5 | Fill #0

## 2019-11-21 MED FILL — ESCITALOPRAM 20 MG TABLET: 20 | 30 days supply | Qty: 60 | Fill #0

## 2019-11-24 ENCOUNTER — Ambulatory Visit (INDEPENDENT_AMBULATORY_CARE_PROVIDER_SITE_OTHER): Payer: Self-pay | Admitting: Psychiatry

## 2019-11-24 ENCOUNTER — Other Ambulatory Visit: Payer: Self-pay

## 2019-11-24 ENCOUNTER — Encounter (HOSPITAL_COMMUNITY): Payer: Self-pay | Admitting: Psychiatry

## 2019-11-24 DIAGNOSIS — F4310 Post-traumatic stress disorder, unspecified: Secondary | ICD-10-CM

## 2019-11-24 DIAGNOSIS — G4700 Insomnia, unspecified: Secondary | ICD-10-CM

## 2019-11-24 DIAGNOSIS — F419 Anxiety disorder, unspecified: Secondary | ICD-10-CM

## 2019-11-24 DIAGNOSIS — F331 Major depressive disorder, recurrent, moderate: Secondary | ICD-10-CM

## 2019-11-24 DIAGNOSIS — Z87891 Personal history of nicotine dependence: Secondary | ICD-10-CM

## 2019-11-24 MED ORDER — BUSPIRONE HCL 7.5 MG PO TABS: 7.5000 mg | ORAL_TABLET | Freq: Every day | ORAL | 0 refills | Status: DC

## 2019-11-24 NOTE — Progress Notes (Signed)
[facility] Outpatient Follow up visit   Patient Identification: Vincent Davis MRN:  [id number] Date of Evaluation:  11/24/2019 Referral Source: Therapist Chief Complaint:   anxiety and ptsd follow up Visit Diagnosis:    ICD-10-CM   1. MDD (major depressive disorder), recurrent episode, moderate (HCC)  F33.1   2. PTSD (post-traumatic stress disorder)  F43.10   3. Anxiety  F41.9   4. Insomnia, unspecified type  G47.00          I connected with Hinton Dyer on 11/24/19 at  3:30 PM EST by telephone and verified that I am speaking with the correct person using two identifiers.  I discussed the limitations, risks, security and privacy concerns of performing an evaluation and management service by telephone and the availability of in person appointments. I also discussed with the patient that there may be a patient responsible charge related to this service. The patient expressed understanding and agreed to proceed.      History of Present Illness: 44 years old currently single African-American male living with his girlfriend, initially referred by his therapist for management of PTSD and depression  Patient had an incident postop  in December 2018 when he went for a cervical surgery C6-C7 "review prior notes if needed.   Still struggles with anxiety and flashbacks have changed therapist Disability was denied now facing challenges with finances and stressed increased   On lexapro 40mg . No side effects   Triggers make him upset    States not be drinking or using marijuana .  He understands not to drink   Modifying factors" , mom. Otherwise financially difficult situation Duration since December 2018  No prior past psychiatric history before December 2018   Past Psychiatric History: denies prior treatment before 2018  Previous Psychotropic Medications: No   Substance Abuse History in the last 12 months:  No.  Consequences of Substance Abuse: NA  Past  Medical History:  Past Medical History:  Diagnosis Date  . Anxiety   . 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 Cervical spondylosis with radiculopathy   . Complication of anesthesia    no previous surgery  . Family history of adverse reaction to anesthesia    "I don't know."  . H/O cervical discectomy   . Obesity (BMI 30.0-34.9)   . Pain and numbness of left upper extremity   . Pneumonia    as a child  . PTSD (post-traumatic stress disorder)   . Vitamin D deficiency 10/2019    Past Surgical History:  Procedure Laterality Date  . ANTERIOR CERVICAL DECOMP/DISCECTOMY FUSION N/A 09/03/2017   Procedure: CERVICAL SIX-SEVEN  ANTERIOR CERVICAL DISCECTOMY AND FUSION, ALLOGRAFT, PLATE;  Surgeon: 14/11/2016, MD;  Location: [facility] OR;  Service: Orthopedics;  Laterality: N/A;  . EVACUATION OF CERVICAL HEMATOMA N/A 09/07/2017   Procedure: EVACUATION OF NECK HEMATOMA;  Surgeon: 14/04/2017, MD;  Location: [facility] OR;  Service: Orthopedics;  Laterality: N/A;  . NO PAST SURGERIES      Family Psychiatric History: denies, but says most of them may have some . coudlnt elaborate any more  Family History:  Family History  Problem Relation Age of Onset  . Other Mother        Healthy    Social History:   Social History   Socioeconomic History  . Marital status: Single    Spouse name: Not on file  . Number of children: 2  . Years of education: some college  . Highest  education level: Not on file  Occupational History    Comment: NA  Tobacco Use  . Smoking status: Former Smoker    Types: Cigarettes  . Smokeless tobacco: Never Used  Substance and Sexual Activity  . Alcohol use: Yes    Comment: occasional  . Drug use: Not Currently    Types: Marijuana    Comment: last use 08/29/17  . Sexual activity: Not Currently  Other Topics Concern  . Not on file  Social History Narrative   Lives with sig other   Caffeine- not daily   Social Determinants of Health   Financial  Resource Strain:   . Difficulty of Paying Living Expenses: Not on file  Food Insecurity:   . Worried About Programme researcher, broadcasting/film/video in the Last Year: Not on file  . Ran Out of Food in the Last Year: Not on file  Transportation Needs:   . Lack of Transportation (Medical): Not on file  . Lack of Transportation (Non-Medical): Not on file  Physical Activity:   . Days of Exercise per Week: Not on file  . Minutes of Exercise per Session: Not on file  Stress:   . Feeling of Stress : Not on file  Social Connections:   . Frequency of Communication with Friends and Family: Not on file  . Frequency of Social Gatherings with Friends and Family: Not on file  . Attends Religious Services: Not on file  . Active Member of Clubs or Organizations: Not on file  . Attends Banker Meetings: Not on file  . Marital Status: Not on file    Allergies:  No Known Allergies  Metabolic Disorder Labs: Lab Results  Component Value Date   HGBA1C 5.3 10/25/2018   No results found for: PROLACTIN Lab Results  Component Value Date   CHOL 142 10/15/2019   TRIG 142 10/15/2019   HDL 64 10/15/2019   CHOLHDL 2.2 10/15/2019   LDLCALC 54 10/15/2019   LDLCALC 92 04/15/2018   Lab Results  Component Value Date   TSH 1.190 10/15/2019    Therapeutic Level Labs: No results found for: LITHIUM No results found for: CBMZ No results found for: VALPROATE  Current Medications: Current Outpatient Medications  Medication Sig Dispense Refill  . albuterol (VENTOLIN HFA) 108 (90 Base) MCG/ACT inhaler Inhale 2 puffs into the lungs every 6 (six) hours as needed for wheezing or shortness of breath. 8 g 2  . aspirin EC 81 MG tablet Take 81 mg by mouth daily.    . busPIRone (BUSPAR) 7.5 MG tablet Take 1 tablet (7.5 mg total) by mouth daily. 30 tablet 0  . escitalopram (LEXAPRO) 20 MG tablet Take 2 tablets (40 mg total) by mouth daily. 60 tablet 1  . levocetirizine (XYZAL) 5 MG tablet Take 5 mg by mouth daily as needed  for allergies.    . meloxicam (MOBIC) 15 MG tablet Take 1 tablet (15 mg total) by mouth daily. 30 tablet 3  . traZODone (DESYREL) 100 MG tablet Take 1 tablet (100 mg total) by mouth at bedtime as needed for sleep. 30 tablet 2  . valACYclovir (VALTREX) 1000 MG tablet Take 1 tablet (1,000 mg total) by mouth 2 (two) times daily. As needed. (Patient not taking: Reported on 10/15/2019) 20 tablet 2  . Vitamin D, Ergocalciferol, (DRISDOL) 1.25 MG (50000 UNIT) CAPS capsule Take 1 capsule (50,000 Units total) by mouth every 7 (seven) days. 5 capsule 6   No current facility-administered medications for this visit.  Psychiatric Specialty Exam: Review of Systems  Skin: Negative for rash.  Psychiatric/Behavioral: The patient has insomnia.     There were no vitals taken for this visit.There is no height or weight on file to calculate BMI.  General Appearance:   Eye Contact:  Fair  Speech:  Slow  Volume:  Decreased  Mood:  stressed  Affect:  Constricted  Thought Process:  Goal Directed  Orientation:  Full (Time, Place, and Person)  Hallucinations as if a shadow at the corner, whisteling sounds at time  Suicidal Thoughts:  No  Homicidal Thoughts:  No  Memory:  Immediate;   Fair Recent;   Fair  Judgement:  Fair  Insight:  Shallow  Psychomotor Activity:  Decreased  Concentration:  Decreased   Recall:  [place]: Fair  Akathisia:  No  Handed:  Right  AIMS (if indicated):  not done  Assets:  Desire for Improvement Social Support  ADL's:  Intact  Cognition: WNL  Sleep:  Poor   Screenings: PHQ2-9     Office Visit from 04/25/2019 in [place] Office Visit from 10/25/2018 in [place] Office Visit from 08/13/2018 in [place] Office Visit from 07/24/2018 in [place] Office Visit from 05/21/2018 in [place]  PHQ-2 Total Score  2  2  0  0  0  PHQ-9 Total  Score  12  9  -  -  -      Assessment and Plan: as follows PTSD: flashbacks sporadic, currently increase stress. Continue lexapro and therapy Will add buspar for anxiety Work on distractions MDD, moderate; subdued, continue lexapro. More frequent therapy if possible to work on stress and coping Avoid alcohol and abstain,  Insomnia: reviewed sleep hygiene continue trazadone Fu 3-4 w Questions addressed, avoid alcohol and he will make early therapy appointment.  I discussed the assessment and treatment plan with the patient. The patient was provided an opportunity to ask questions and all were answered. The patient agreed with the plan and demonstrated an understanding of the instructions.   The patient was advised to call back or seek an in-person evaluation if the symptoms worsen or if the condition fails to improve as anticipated.  Merian Capron, MD 2/22/20213:50 PM

## 2019-11-25 ENCOUNTER — Telehealth (HOSPITAL_COMMUNITY): Payer: Self-pay | Admitting: Licensed Clinical Social Worker

## 2019-11-25 ENCOUNTER — Ambulatory Visit (HOSPITAL_COMMUNITY): Payer: Self-pay | Admitting: Licensed Clinical Social Worker

## 2019-11-25 ENCOUNTER — Telehealth: Payer: Self-pay | Admitting: Family Medicine

## 2019-11-25 ENCOUNTER — Ambulatory Visit (HOSPITAL_COMMUNITY): Payer: Self-pay | Admitting: Psychiatry

## 2019-11-25 MED FILL — busPIRone HCL 7.5 MG TABS: 7.5 | 30 days supply | Qty: 30 | Fill #0

## 2019-11-25 NOTE — Telephone Encounter (Signed)
I called patient at 11:00 am for phone session. He did not answer and voice mail box was full.

## 2019-11-25 NOTE — Telephone Encounter (Signed)
Mr. Vincent Davis mail box is full. I cannot leave a message. Or find out what his needs are.

## 2019-11-26 ENCOUNTER — Telehealth: Payer: Self-pay

## 2019-11-26 NOTE — Telephone Encounter (Signed)
Vincent Davis c/o chest pain. Pain in the area where he was intubated back in 2018. The chest pain has worsen in the past 2 weeks.   Patient advised to seek help for the chest pain in the ED.   He also wanted to know what to do with the last letter you wrote for him? Does he need to keep it or hold it for his appeals?

## 2019-12-09 ENCOUNTER — Ambulatory Visit (INDEPENDENT_AMBULATORY_CARE_PROVIDER_SITE_OTHER): Payer: Self-pay | Admitting: Licensed Clinical Social Worker

## 2019-12-09 DIAGNOSIS — F4310 Post-traumatic stress disorder, unspecified: Secondary | ICD-10-CM

## 2019-12-10 NOTE — Progress Notes (Signed)
Virtual Visit via Telephone Note  I connected with Vincent Davis on 12/10/19 at 11:00 AM EST by telephone and verified that I am speaking with the correct person using two identifiers.  Location: Patient: Home  Provider: Office    I discussed the limitations, risks, security and privacy concerns of performing an evaluation and management service by telephone and the availability of in person appointments. I also discussed with the patient that there may be a patient responsible charge related to this service. The patient expressed understanding and agreed to proceed.   THERAPIST PROGRESS NOTE  Session Time: 11:00 am-12:15 pm  Participation Level: Active  Behavioral Response: CasualAlertAnxious  Type of Therapy: Individual Therapy  Treatment Goals addressed: Coping  Interventions: CBT and Solution Focused  Case Summary: [name] is a 44 y.o. male who presents oriented x4 (person, place, situation, time and object), casually dressed, appropriately groomed, average height, average weight, and cooperative to address mood and anxiety. Patient has has a history of medical treatment including neck and arm pain, and blood clot. Patient has a history of mental health treatment including outpatient therapy and medication management. Patient denies suicidal and homicidal ideations. Patient admits to psychosis including hearing whispers and seeing shadow figures. Patient denies substance abuse. Patient is at low risk for lethality.   Session#2  Physically: Patient has not been sleeping. He has nightmares that are similar to his traumatic experience in the hospital. Patient has pain during the dreams as well as difficulty breathing. Patient has a vitamin D decency. Patient has pain from the "hump" on his chest.   Spiritually/values: No issues identified.  Relationships: Patient's relationships are going well. He has lost some family due to COVID19. Marland Kitchen    Emotionally/Mentally/Behavior: Patient recalled his traumatic experience in the hospital. He experiences fear and irritability. Patient shared more of his story and feels like he hasn't shared certain aspects of his story in the hospital with anyone other than his mother. Patient noted that when he was in the hospital he hadn't slept for 4 or 5 days. He felt helpless/insecure while in the hospital. He feels that way now and doesn't leave the home much.   Patient engaged in session. He responded well to interventions. Patient continues to meet criteria for PTSD. Patient will continue in outpatient therapy due to being the least restrictive service to meet his needs at this time. Patient made no progress on his goals at this time.   Suicidal/Homicidal: Negativewithout intent/plan  Therapist Response: Therapist reviewed patient's recent thoughts and behaviors. Therapist utilized CBT to address mood and anxiety. Therapist reviewed patient's treatment history and assessed for current symptoms. Therapist discussed with patient his past trauma and his current nightmares.   Plan: Return again in 2 weeks.Review treatment plan on or before 05.09.221  Diagnosis: Axis I: Post Traumatic Stress Disorder    Axis II: No diagnosis  I discussed the assessment and treatment plan with the patient. The patient was provided an opportunity to ask questions and all were answered. The patient agreed with the plan and demonstrated an understanding of the instructions.   The patient was advised to call back or seek an in-person evaluation if the symptoms worsen or if the condition fails to improve as anticipated.  I provided 60 minutes of non-face-to-face time during this encounter.  Bynum Bellows, LCSW 12/10/2019

## 2019-12-25 ENCOUNTER — Ambulatory Visit (INDEPENDENT_AMBULATORY_CARE_PROVIDER_SITE_OTHER): Payer: Self-pay | Admitting: Psychiatry

## 2019-12-25 ENCOUNTER — Encounter (HOSPITAL_COMMUNITY): Payer: Self-pay | Admitting: Psychiatry

## 2019-12-25 DIAGNOSIS — F4310 Post-traumatic stress disorder, unspecified: Secondary | ICD-10-CM

## 2019-12-25 DIAGNOSIS — F419 Anxiety disorder, unspecified: Secondary | ICD-10-CM

## 2019-12-25 DIAGNOSIS — G4700 Insomnia, unspecified: Secondary | ICD-10-CM

## 2019-12-25 DIAGNOSIS — F331 Major depressive disorder, recurrent, moderate: Secondary | ICD-10-CM

## 2019-12-25 MED ORDER — ESCITALOPRAM OXALATE 20 MG PO TABS: 40.0000 mg | ORAL_TABLET | Freq: Every day | ORAL | 1 refills | Status: DC

## 2019-12-25 MED ORDER — BUSPIRONE HCL 7.5 MG PO TABS: 7.5000 mg | ORAL_TABLET | Freq: Two times a day (BID) | ORAL | 1 refills | Status: DC

## 2019-12-25 MED ORDER — TRAZODONE HCL 100 MG PO TABS: 100.0000 mg | ORAL_TABLET | Freq: Every evening | ORAL | 2 refills | Status: DC | PRN

## 2019-12-25 MED FILL — traZODone HCL 100 MG TABS: 100 | 30 days supply | Qty: 30 | Fill #0

## 2019-12-25 MED FILL — ESCITALOPRAM 20 MG TABLET: 20 | 30 days supply | Qty: 60 | Fill #0

## 2019-12-25 MED FILL — busPIRone HCL 7.5 MG TABS: 7.5 | 30 days supply | Qty: 60 | Fill #0

## 2019-12-25 NOTE — Progress Notes (Signed)
[facility] Outpatient Follow up visit   Patient Identification: Vincent Davis MRN:  [id number] Date of Evaluation:  12/25/2019 Referral Source: Therapist Chief Complaint:   anxiety and ptsd follow up Visit Diagnosis:    ICD-10-CM   1. MDD (major depressive disorder), recurrent episode, moderate (HCC)  F33.1   2. PTSD (post-traumatic stress disorder)  F43.10   3. Insomnia, unspecified type  G47.00 traZODone (DESYREL) 100 MG tablet  4. Anxiety  F41.9 traZODone (DESYREL) 100 MG tablet         I connected with Hinton Dyer on 12/25/19 at 10:00 AM EDT by telephone and verified that I am speaking with the correct person using two identifiers.   I discussed the limitations, risks, security and privacy concerns of performing an evaluation and management service by telephone and the availability of in person appointments. I also discussed with the patient that there may be a patient responsible charge related to this service. The patient expressed understanding and agreed to proceed.      History of Present Illness: 44  years old currently single African-American male living with his girlfriend, initially referred by his therapist for management of PTSD and depression  Patient had an incident postop  in December 2018 when he went for a cervical surgery C6-C7 "review prior notes if needed.   Still struggling with past flashbacks and nightmares, talked about therapy and concern of non consistency or change  buspar helps for a few hours, on lexapro as well Still dwells on past and difficult to let go of what happened and how is it effected him   On lexapro 40mg . No side effects   Triggers make him upset  Duration since 2018  States not be drinking or using marijuana .  He understands not to drink  Aggravating factor: hospital trauma  Modifying factors" , mom Otherwise financially difficult situation Duration since December 2018  No prior past psychiatric history before  December 2018   Past Psychiatric History: denies prior treatment before 2018  Previous Psychotropic Medications: No   Substance Abuse History in the last 12 months:  No.  Consequences of Substance Abuse: NA  Past Medical History:  Past Medical History:  Diagnosis Date  . Anxiety   . 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 Cervical spondylosis with radiculopathy   . Complication of anesthesia    no previous surgery  . Family history of adverse reaction to anesthesia    "I don't know."  . H/O cervical discectomy   . Obesity (BMI 30.0-34.9)   . Pain and numbness of left upper extremity   . Pneumonia    as a child  . PTSD (post-traumatic stress disorder)   . Vitamin D deficiency 10/2019    Past Surgical History:  Procedure Laterality Date  . ANTERIOR CERVICAL DECOMP/DISCECTOMY FUSION N/A 09/03/2017   Procedure: CERVICAL SIX-SEVEN  ANTERIOR CERVICAL DISCECTOMY AND FUSION, ALLOGRAFT, PLATE;  Surgeon: 14/11/2016, MD;  Location: [facility] OR;  Service: Orthopedics;  Laterality: N/A;  . EVACUATION OF CERVICAL HEMATOMA N/A 09/07/2017   Procedure: EVACUATION OF NECK HEMATOMA;  Surgeon: 14/04/2017, MD;  Location: [facility] OR;  Service: Orthopedics;  Laterality: N/A;  . NO PAST SURGERIES      Family Psychiatric History: denies, but says most of them may have some . coudlnt elaborate any more  Family History:  Family History  Problem Relation Age of Onset  . Other Mother        Healthy  Social History:   Social History   Socioeconomic History  . Marital status: Single    Spouse name: Not on file  . Number of children: 2  . Years of education: some college  . Highest education level: Not on file  Occupational History    Comment: NA  Tobacco Use  . Smoking status: Former Smoker    Types: Cigarettes  . Smokeless tobacco: Never Used  Substance and Sexual Activity  . Alcohol use: Yes    Comment: occasional  . Drug use: Not Currently    Types: Marijuana     Comment: last use 08/29/17  . Sexual activity: Not Currently  Other Topics Concern  . Not on file  Social History Narrative   Lives with sig other   Caffeine- not daily   Social Determinants of Health   Financial Resource Strain:   . Difficulty of Paying Living Expenses:   Food Insecurity:   . Worried About Programme researcher, broadcasting/film/video in the Last Year:   . Barista in the Last Year:   Transportation Needs:   . Freight forwarder (Medical):   Marland Kitchen Lack of Transportation (Non-Medical):   Physical Activity:   . Days of Exercise per Week:   . Minutes of Exercise per Session:   Stress:   . Feeling of Stress :   Social Connections:   . Frequency of Communication with Friends and Family:   . Frequency of Social Gatherings with Friends and Family:   . Attends Religious Services:   . Active Member of Clubs or Organizations:   . Attends Banker Meetings:   Marland Kitchen Marital Status:     Allergies:  No Known Allergies  Metabolic Disorder Labs: Lab Results  Component Value Date   HGBA1C 5.3 10/25/2018   No results found for: PROLACTIN Lab Results  Component Value Date   CHOL 142 10/15/2019   TRIG 142 10/15/2019   HDL 64 10/15/2019   CHOLHDL 2.2 10/15/2019   LDLCALC 54 10/15/2019   LDLCALC 92 04/15/2018   Lab Results  Component Value Date   TSH 1.190 10/15/2019    Therapeutic Level Labs: No results found for: LITHIUM No results found for: CBMZ No results found for: VALPROATE  Current Medications: Current Outpatient Medications  Medication Sig Dispense Refill  . albuterol (VENTOLIN HFA) 108 (90 Base) MCG/ACT inhaler Inhale 2 puffs into the lungs every 6 (six) hours as needed for wheezing or shortness of breath. 8 g 2  . aspirin EC 81 MG tablet Take 81 mg by mouth daily.    . busPIRone (BUSPAR) 7.5 MG tablet Take 1 tablet (7.5 mg total) by mouth 2 (two) times daily. 60 tablet 1  . escitalopram (LEXAPRO) 20 MG tablet Take 2 tablets (40 mg total) by mouth daily. 60  tablet 1  . levocetirizine (XYZAL) 5 MG tablet Take 5 mg by mouth daily as needed for allergies.    . meloxicam (MOBIC) 15 MG tablet Take 1 tablet (15 mg total) by mouth daily. 30 tablet 3  . traZODone (DESYREL) 100 MG tablet Take 1 tablet (100 mg total) by mouth at bedtime as needed for sleep. 30 tablet 2  . valACYclovir (VALTREX) 1000 MG tablet Take 1 tablet (1,000 mg total) by mouth 2 (two) times daily. As needed. (Patient not taking: Reported on 10/15/2019) 20 tablet 2  . Vitamin D, Ergocalciferol, (DRISDOL) 1.25 MG (50000 UNIT) CAPS capsule Take 1 capsule (50,000 Units total) by mouth every 7 (seven) days.  5 capsule 6   No current facility-administered medications for this visit.      Psychiatric Specialty Exam: Review of Systems  Skin: Negative for rash.  Psychiatric/Behavioral: The patient has insomnia.     There were no vitals taken for this visit.There is no height or weight on file to calculate BMI.  General Appearance:   Eye Contact:  Fair  Speech:  Slow  Volume:  Decreased  Mood:  stressed  Affect:  Constricted  Thought Process:  Goal Directed  Orientation:  Full (Time, Place, and Person)  Hallucinations as if a shadow at the corner, whisteling sounds at time  Suicidal Thoughts:  No  Homicidal Thoughts:  No  Memory:  Immediate;   Fair Recent;   Fair  Judgement:  Fair  Insight:  Shallow  Psychomotor Activity:  Decreased  Concentration:  Decreased   Recall:  [place]: Fair  Akathisia:  No  Handed:  Right  AIMS (if indicated):  not done  Assets:  Desire for Improvement Social Support  ADL's:  Intact  Cognition: WNL  Sleep:  Poor   Screenings: PHQ2-9     Office Visit from 04/25/2019 in [place] Office Visit from 10/25/2018 in [place] Office Visit from 08/13/2018 in [place] Office Visit from 07/24/2018 in [place] Office Visit from 05/21/2018  in [place]  PHQ-2 Total Score  2  2  0  0  0  PHQ-9 Total Score  12  9  --  --  --      Assessment and Plan: as follows PTSD: poor sleep and flashbacks, continue lexapro, will increase buspar to bid Recommend to re start therapy and or change therapist if concern but important to be on therapy Work on distractions He needs counselling regular to process his trauma and work on distractions MDD, moderate;somewhat subdued, continue lexapro, re start therapy  Avoid alcohol and abstain,  Insomnia: reviewed sleep hygiene take trazadone at night. Fu 3-4 w Questions addressed, avoid alcohol and he will make early therapy appointment.  I discussed the assessment and treatment plan with the patient. The patient was provided an opportunity to ask questions and all were answered. The patient agreed with the plan and demonstrated an understanding of the instructions.   The patient was advised to call back or seek an in-person evaluation if the symptoms worsen or if the condition fails to improve as anticipated. Non face to face time spent: 69min Merian Capron, MD 3/25/202110:26 AM

## 2020-01-08 ENCOUNTER — Ambulatory Visit (HOSPITAL_COMMUNITY): Payer: Self-pay | Admitting: Licensed Clinical Social Worker

## 2020-01-22 ENCOUNTER — Ambulatory Visit (INDEPENDENT_AMBULATORY_CARE_PROVIDER_SITE_OTHER): Payer: Self-pay | Admitting: Licensed Clinical Social Worker

## 2020-01-22 DIAGNOSIS — F4310 Post-traumatic stress disorder, unspecified: Secondary | ICD-10-CM

## 2020-01-23 NOTE — Progress Notes (Signed)
Virtual Visit via Telephone Note  I connected with Vincent Davis on 01/23/20 at  1:00 PM EDT by telephone and verified that I am speaking with the correct person using two identifiers.  Location: Patient: Home  Provider: Office    I discussed the limitations, risks, security and privacy concerns of performing an evaluation and management service by telephone and the availability of in person appointments. I also discussed with the patient that there may be a patient responsible charge related to this service. The patient expressed understanding and agreed to proceed.   THERAPIST PROGRESS NOTE  Session Time: 1:00 pm-1:45 pm  Participation Level: Active  Behavioral Response: CasualAlertAnxious  Type of Therapy: Individual Therapy  Treatment Goals addressed: Coping  Interventions: CBT and Solution Focused  Case Summary: Vincent Davis is a 44 y.o. male who presents oriented x4 (person, place, situation, time and object), casually dressed, appropriately groomed, average height, average weight, and cooperative to address mood and anxiety. Patient has has a history of medical treatment including neck and arm pain, and blood clot. Patient has a history of mental health treatment including outpatient therapy and medication management. Patient denies suicidal and homicidal ideations. Patient admits to psychosis including hearing whispers and seeing shadow figures. Patient denies substance abuse. Patient is at low risk for lethality.   Session#3  Physically: Patient continues to have issues with sleep. He is has difficulty with focus. Patient continues to have nightmares. Patient has low motivation. Patient can't sleep if it is too quiet or if it is too dark. Patient is sleeping with lights on and sound.   Spiritually/values: No issues identified.  Relationships: Patient feels like people don't understand him. He has people that he talks to at times. Patient has people that will  take him to the store. Patient feels paranoid around others and feels like people are out to get him but has people he goes in public with.   Emotionally/Mentally/Behavior: Patient's mood has been up and down. Patient loses time. He feels like he is not asleep but not awake. Patient feels like others are talking about him but couldn't identify what people could be saying about him.   Patient engaged in session. He responded well to interventions. Patient continues to meet criteria for PTSD. Patient will continue in outpatient therapy due to being the least restrictive service to meet his needs at this time. Patient made minimal progress on his goals at this time.   Suicidal/Homicidal: Negativewithout intent/plan  Therapist Response: Therapist reviewed patient's recent thoughts and behaviors. Therapist utilized CBT to address mood and anxiety. Therapist reviewed patient's treatment history and assessed for current symptoms. Therapist talked with patient and attempted to keep patient in the present instead of ruminating on the past.    Plan: Return again in 2 weeks.Review treatment plan on or before 05.09.221  Diagnosis: Axis I: Post Traumatic Stress Disorder    Axis II: No diagnosis  I discussed the assessment and treatment plan with the patient. The patient was provided an opportunity to ask questions and all were answered. The patient agreed with the plan and demonstrated an understanding of the instructions.   The patient was advised to call back or seek an in-person evaluation if the symptoms worsen or if the condition fails to improve as anticipated.  I provided 60 minutes of non-face-to-face time during this encounter.  Bynum Bellows, LCSW 01/23/2020

## 2020-01-27 ENCOUNTER — Telehealth (INDEPENDENT_AMBULATORY_CARE_PROVIDER_SITE_OTHER): Payer: Self-pay | Admitting: Psychiatry

## 2020-01-27 ENCOUNTER — Encounter (HOSPITAL_COMMUNITY): Payer: Self-pay | Admitting: Psychiatry

## 2020-01-27 DIAGNOSIS — F331 Major depressive disorder, recurrent, moderate: Secondary | ICD-10-CM

## 2020-01-27 DIAGNOSIS — G4700 Insomnia, unspecified: Secondary | ICD-10-CM

## 2020-01-27 DIAGNOSIS — F4310 Post-traumatic stress disorder, unspecified: Secondary | ICD-10-CM

## 2020-01-27 NOTE — Progress Notes (Signed)
[facility] Outpatient Follow up visit   Patient Identification: Vincent Davis MRN:  [id number] Date of Evaluation:  01/27/2020 Referral Source: Therapist Chief Complaint:    anxiety follow up  Visit Diagnosis:    ICD-10-CM   1. PTSD (post-traumatic stress disorder)  F43.10   2. MDD (major depressive disorder), recurrent episode, moderate (HCC)  F33.1   3. Insomnia, unspecified type  G47.00       I connected with Vincent Davis on 01/27/20 at  1:30 PM EDT by telephone and verified that I am speaking with the correct person using two identifiers.    I discussed the limitations, risks, security and privacy concerns of performing an evaluation and management service by telephone and the availability of in person appointments. I also discussed with the patient that there may be a patient responsible charge related to this service. The patient expressed understanding and agreed to proceed.      History of Present Illness: 44  years old currently single African-American male living with his girlfriend, initially referred by his therapist for management of PTSD and depression  Patient had an incident postop  in December 2018 when he went for a cervical surgery C6-C7 "review prior notes if needed.   Struggles at night with nightmares , tries to distract during the day but avoids crowds so mostly at home GF arguments adds stress Started therapy with Vincent Davis working on trauma and its effect  Have added buspar to bid has used still mostly once but helps some  Says managing it but feels far from where he was prior to incident.  Dwells on incident and tries to distract On lexapro 40mg . No side effects  Duration since 2018  States not be drinking or using marijuana .  He understands not to drink  Aggravating factor: hospital related  trauma Modifying factors" , mom Otherwise financially difficult situation Duration since December 2018  No prior past psychiatric history before  December 2018   Past Psychiatric History: denies prior treatment before 2018  Previous Psychotropic Medications: No   Substance Abuse History in the last 12 months:  No.  Consequences of Substance Abuse: NA  Past Medical History:  Past Medical History:  Diagnosis Date  . Anxiety   . 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 Cervical spondylosis with radiculopathy   . Complication of anesthesia    no previous surgery  . Family history of adverse reaction to anesthesia    "I don't know."  . H/O cervical discectomy   . Obesity (BMI 30.0-34.9)   . Pain and numbness of left upper extremity   . Pneumonia    as a child  . PTSD (post-traumatic stress disorder)   . Vitamin D deficiency 10/2019    Past Surgical History:  Procedure Laterality Date  . ANTERIOR CERVICAL DECOMP/DISCECTOMY FUSION N/A 09/03/2017   Procedure: CERVICAL SIX-SEVEN  ANTERIOR CERVICAL DISCECTOMY AND FUSION, ALLOGRAFT, PLATE;  Surgeon: Marybelle Killings, MD;  Location: [place];  Service: Orthopedics;  Laterality: N/A;  . EVACUATION OF CERVICAL HEMATOMA N/A 09/07/2017   Procedure: EVACUATION OF NECK HEMATOMA;  Surgeon: Marybelle Killings, MD;  Location: [place];  Service: Orthopedics;  Laterality: N/A;  . NO PAST SURGERIES      Family Psychiatric History: denies, but says most of them may have some . coudlnt elaborate any more  Family History:  Family History  Problem Relation Age of Onset  . Other Mother        Healthy  Social History:   Social History   Socioeconomic History  . Marital status: Single    Spouse name: Not on file  . Number of children: 2  . Years of education: some college  . Highest education level: Not on file  Occupational History    Comment: NA  Tobacco Use  . Smoking status: Former Smoker    Types: Cigarettes  . Smokeless tobacco: Never Used  Substance and Sexual Activity  . Alcohol use: Yes    Comment: occasional  . Drug use: Not Currently    Types: Marijuana     Comment: last use 08/29/17  . Sexual activity: Not Currently  Other Topics Concern  . Not on file  Social History Narrative   Lives with sig other   Caffeine- not daily   Social Determinants of Health   Financial Resource Strain:   . Difficulty of Paying Living Expenses:   Food Insecurity:   . Worried About Programme researcher, broadcasting/film/video in the Last Year:   . Barista in the Last Year:   Transportation Needs:   . Freight forwarder (Medical):   Marland Kitchen Lack of Transportation (Non-Medical):   Physical Activity:   . Days of Exercise per Week:   . Minutes of Exercise per Session:   Stress:   . Feeling of Stress :   Social Connections:   . Frequency of Communication with Friends and Family:   . Frequency of Social Gatherings with Friends and Family:   . Attends Religious Services:   . Active Member of Clubs or Organizations:   . Attends Banker Meetings:   Marland Kitchen Marital Status:     Allergies:  No Known Allergies  Metabolic Disorder Labs: Lab Results  Component Value Date   HGBA1C 5.3 10/25/2018   No results found for: PROLACTIN Lab Results  Component Value Date   CHOL 142 10/15/2019   TRIG 142 10/15/2019   HDL 64 10/15/2019   CHOLHDL 2.2 10/15/2019   LDLCALC 54 10/15/2019   LDLCALC 92 04/15/2018   Lab Results  Component Value Date   TSH 1.190 10/15/2019    Therapeutic Level Labs: No results found for: LITHIUM No results found for: CBMZ No results found for: VALPROATE  Current Medications: Current Outpatient Medications  Medication Sig Dispense Refill  . albuterol (VENTOLIN HFA) 108 (90 Base) MCG/ACT inhaler Inhale 2 puffs into the lungs every 6 (six) hours as needed for wheezing or shortness of breath. 8 g 2  . aspirin EC 81 MG tablet Take 81 mg by mouth daily.    . busPIRone (BUSPAR) 7.5 MG tablet Take 1 tablet (7.5 mg total) by mouth 2 (two) times daily. 60 tablet 1  . escitalopram (LEXAPRO) 20 MG tablet Take 2 tablets (40 mg total) by mouth daily. 60  tablet 1  . levocetirizine (XYZAL) 5 MG tablet Take 5 mg by mouth daily as needed for allergies.    . meloxicam (MOBIC) 15 MG tablet Take 1 tablet (15 mg total) by mouth daily. 30 tablet 3  . traZODone (DESYREL) 100 MG tablet Take 1 tablet (100 mg total) by mouth at bedtime as needed for sleep. 30 tablet 2  . valACYclovir (VALTREX) 1000 MG tablet Take 1 tablet (1,000 mg total) by mouth 2 (two) times daily. As needed. (Patient not taking: Reported on 10/15/2019) 20 tablet 2  . Vitamin D, Ergocalciferol, (DRISDOL) 1.25 MG (50000 UNIT) CAPS capsule Take 1 capsule (50,000 Units total) by mouth every 7 (seven) days.  5 capsule 6   No current facility-administered medications for this visit.      Psychiatric Specialty Exam: Review of Systems  Cardiovascular: Negative for chest pain.  Skin: Negative for rash.  Psychiatric/Behavioral: The patient has insomnia.     There were no vitals taken for this visit.There is no height or weight on file to calculate BMI.  General Appearance:   Eye Contact:  Fair  Speech:  Slow  Volume:  Decreased  Mood:  Somewhat subdued  Affect:  Constricted  Thought Process:  Goal Directed  Orientation:  Full (Time, Place, and Person)  Hallucinations as if a shadow at the corner, whisteling sounds at time  Suicidal Thoughts:  No  Homicidal Thoughts:  No  Memory:  Immediate;   Fair Recent;   Fair  Judgement:  Fair  Insight:  Shallow  Psychomotor Activity:  Decreased  Concentration:  Decreased   Recall:  Fair  Fund of Knowledge:Fair  Language: Fair  Akathisia:  No  Handed:  Right  AIMS (if indicated):  not done  Assets:  Desire for Improvement Social Support  ADL's:  Intact  Cognition: WNL  Sleep:  Poor   Screenings: PHQ2-9     Office Visit from 04/25/2019 in [facility] Office Visit from 10/25/2018 in [facility] Office Visit from 08/13/2018 in [facility] Office Visit from 07/24/2018 in [facility] Office Visit from 05/21/2018 in [facility]  PHQ-2 Total Score  2  2  0  0  0  PHQ-9 Total Score  12  9  -  -  -      Assessment and Plan: as follows PTSD: irregular sleep with triggers lead to flashback, has started therapy and adjusting to new therapist Continue lexapro. Increase buspar to bid regular for stress. Work on distractions He needs counselling regular to process his trauma and work on distractions MDD, moderate;somewhat subdued continue lexapro, work on small steps towards going out or dealing with stressors  Avoid alcohol and abstain,  Insomnia: reviewed sleep hygiene, continue trazadone  Questions addressed, avoid alcohol and he will make early therapy appointment.  I discussed the assessment and treatment plan with the patient. The patient was provided an opportunity to ask questions and all were answered. The patient agreed with the plan and demonstrated an understanding of the instructions.   The patient was advised to call back or seek an in-person evaluation if the symptoms worsen or if the condition fails to improve as anticipated. Non face to face time spent: 20 min Thresa Ross, MD 4/27/20211:40 PM

## 2020-02-05 ENCOUNTER — Ambulatory Visit (INDEPENDENT_AMBULATORY_CARE_PROVIDER_SITE_OTHER): Payer: Self-pay | Admitting: Licensed Clinical Social Worker

## 2020-02-05 DIAGNOSIS — F4310 Post-traumatic stress disorder, unspecified: Secondary | ICD-10-CM

## 2020-02-05 NOTE — Progress Notes (Signed)
Virtual Visit via Telephone Note  I connected with Vincent Davis on 02/05/20 at  1:00 PM EDT by telephone and verified that I am speaking with the correct person using two identifiers.  Location: Patient: Home  Provider: Office    I discussed the limitations, risks, security and privacy concerns of performing an evaluation and management service by telephone and the availability of in person appointments. I also discussed with the patient that there may be a patient responsible charge related to this service. The patient expressed understanding and agreed to proceed.   THERAPIST PROGRESS NOTE  Session Time: 1:00 pm-1:45 pm  Participation Level: Active  Behavioral Response: CasualAlertAnxious  Type of Therapy: Individual Therapy  Treatment Goals addressed: Coping  Interventions: CBT and Solution Focused  Case Summary: Vincent Davis is a 44 y.o. male who presents oriented x4 (person, place, situation, time and object), casually dressed, appropriately groomed, average height, average weight, and cooperative to address mood and anxiety. Patient has has a history of medical treatment including neck and arm pain, and blood clot. Patient has a history of mental health treatment including outpatient therapy and medication management. Patient denies suicidal and homicidal ideations. Patient admits to psychosis including hearing whispers and seeing shadow figures. Patient denies substance abuse. Patient is at low risk for lethality.   Session#4  Physically: Patient continues to struggle with his sleep. He is having difficulty due physical pain in his chest. Patient's pain reminds him of his traumatic hospital visit.  Spiritually/values: No issues identified.  Relationships: Patient shared his feelings about relationships. He continues to struggle with them. Patient tries to "act" ok or have a good day and feels like this gives the impression he is fine. Patient also feels stuck in  his "entanglement." He feels like his partner supports him but not really. He feels like she doesn't take his problems serious a lot of times.  Emotionally/Mentally/Behavior: Patient's mood has been up and down. Patient continues to try to leave the home, and continues to accept his new normal. Patient feels like it is easier to accept his new normal rather than cope and get better.   Patient engaged in session. He responded well to interventions. Patient continues to meet criteria for PTSD. Patient will continue in outpatient therapy due to being the least restrictive service to meet his needs at this time. Patient made minimal progress on his goals at this time.   Suicidal/Homicidal: Negativewithout intent/plan  Therapist Response: Therapist reviewed patient's recent thoughts and behaviors. Therapist utilized CBT to address mood and anxiety. Therapist reviewed patient's treatment plan.  Plan: Return again in 2 weeks.Review treatment plan on or before 07.09.221  Diagnosis: Axis I: Post Traumatic Stress Disorder    Axis II: No diagnosis  I discussed the assessment and treatment plan with the patient. The patient was provided an opportunity to ask questions and all were answered. The patient agreed with the plan and demonstrated an understanding of the instructions.   The patient was advised to call back or seek an in-person evaluation if the symptoms worsen or if the condition fails to improve as anticipated.  I provided 50 minutes of non-face-to-face time during this encounter.  Bynum Bellows, LCSW 02/05/2020

## 2020-02-11 ENCOUNTER — Ambulatory Visit: Payer: Self-pay | Admitting: Family Medicine

## 2020-02-17 ENCOUNTER — Other Ambulatory Visit: Payer: Self-pay

## 2020-02-17 ENCOUNTER — Encounter: Payer: Self-pay | Admitting: Family Medicine

## 2020-02-17 ENCOUNTER — Ambulatory Visit (INDEPENDENT_AMBULATORY_CARE_PROVIDER_SITE_OTHER): Payer: Self-pay | Admitting: Family Medicine

## 2020-02-17 VITALS — BP 124/68 | HR 67 | Temp 99.0°F | Ht 71.0 in | Wt 235.4 lb

## 2020-02-17 DIAGNOSIS — R202 Paresthesia of skin: Secondary | ICD-10-CM

## 2020-02-17 DIAGNOSIS — M549 Dorsalgia, unspecified: Secondary | ICD-10-CM

## 2020-02-17 DIAGNOSIS — G8929 Other chronic pain: Secondary | ICD-10-CM

## 2020-02-17 DIAGNOSIS — I10 Essential (primary) hypertension: Secondary | ICD-10-CM

## 2020-02-17 DIAGNOSIS — M79605 Pain in left leg: Secondary | ICD-10-CM

## 2020-02-17 DIAGNOSIS — R200 Anesthesia of skin: Secondary | ICD-10-CM

## 2020-02-17 DIAGNOSIS — Z09 Encounter for follow-up examination after completed treatment for conditions other than malignant neoplasm: Secondary | ICD-10-CM

## 2020-02-17 DIAGNOSIS — M79604 Pain in right leg: Secondary | ICD-10-CM

## 2020-02-17 DIAGNOSIS — F419 Anxiety disorder, unspecified: Secondary | ICD-10-CM

## 2020-02-17 DIAGNOSIS — F4310 Post-traumatic stress disorder, unspecified: Secondary | ICD-10-CM

## 2020-02-17 NOTE — Progress Notes (Signed)
[facility]   Established Patient Office Visit  Subjective:  Patient ID: Vincent Davis, male    DOB: 19-Apr-1976  Age: 44 y.o. MRN: [id number]  CC:  Chief Complaint  Patient presents with  . Follow-up  . nunbness    left foot & toes  . Pain    left arm    HPI Vincent Davis is a 44 year old male who presents for Follow Up today.   Past Medical History:  Diagnosis Date  . Anxiety   . 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 Cervical spondylosis with radiculopathy   . Complication of anesthesia    no previous surgery  . Family history of adverse reaction to anesthesia    "I don't know."  . H/O cervical discectomy   . Obesity (BMI 30.0-34.9)   . Pain and numbness of left upper extremity   . Pneumonia    as a child  . PTSD (post-traumatic stress disorder)   . Vitamin D deficiency 10/2019   Patient Active Problem List   Diagnosis Date Noted  . Essential hypertension 04/27/2019  . Numbness and tingling 04/27/2019  . Anxiety 04/27/2019  . PTSD (post-traumatic stress disorder) 11/04/2018  . Oropharyngeal dysphagia   . Acute renal failure (HCC) 09/07/2017  . Pulmonary edema, acute (HCC)   . Acute hypercapnic respiratory failure (HCC)   . Cardiac arrest due to respiratory disorder (HCC)   . 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 Alcohol use 05/10/2017   Current Status: Since his last office visit, he is doing well with no complaints. He continues to have chronic neck and left extremity pain and numbness. He denies visual changes, chest pain, cough, shortness of breath, heart palpitations, and falls. He has occasional headaches and dizziness with position changes. Denies severe headaches, confusion, seizures, double vision, and blurred vision, nausea and vomiting. He has moderate anxiety today, r/t frequent changes in his Psychiatrist. He denies suicidal ideations, homicidal  ideations, or auditory hallucinations. He has appointment with him this week. He denies fevers, chills, fatigue, recent infections, weight loss, and night sweats. Denies GI problems such as diarrhea, and constipation. He has no reports of blood in stools, dysuria and hematuria. He is taking all medications as prescribed. He denies pain today.   Past Surgical History:  Procedure Laterality Date  . ANTERIOR CERVICAL DECOMP/DISCECTOMY FUSION N/A 09/03/2017   Procedure: CERVICAL SIX-SEVEN  ANTERIOR CERVICAL DISCECTOMY AND FUSION, ALLOGRAFT, PLATE;  Surgeon: Eldred Manges, MD;  Location: [facility] OR;  Service: Orthopedics;  Laterality: N/A;  . EVACUATION OF CERVICAL HEMATOMA N/A 09/07/2017   Procedure: EVACUATION OF NECK HEMATOMA;  Surgeon: Eldred Manges, MD;  Location: [facility] OR;  Service: Orthopedics;  Laterality: N/A;  . NO PAST SURGERIES      Family History  Problem Relation Age of Onset  . Other Mother        Healthy    Social History   Socioeconomic History  . Marital status: Single    Spouse name: Not on file  . Number of children: 2  . Years of education: some college  . Highest education level: Not on file  Occupational History    Comment: NA  Tobacco Use  . Smoking status: Former Smoker    Types: Cigarettes  . Smokeless tobacco: Never Used  Substance and Sexual Activity  . Alcohol use: Yes    Comment: occasional  . Drug use: Not Currently  Types: Marijuana    Comment: last use 08/29/17  . Sexual activity: Not Currently  Other Topics Concern  . Not on file  Social History Narrative   Lives with sig other   Caffeine- not daily   Social Determinants of Health   Financial Resource Strain:   . Difficulty of Paying Living Expenses:   Food Insecurity:   . Worried About Programme researcher, broadcasting/film/video in the Last Year:   . Barista in the Last Year:   Transportation Needs:   . Freight forwarder (Medical):   Marland Kitchen Lack of Transportation (Non-Medical):   Physical Activity:   .  Days of Exercise per Week:   . Minutes of Exercise per Session:   Stress:   . Feeling of Stress :   Social Connections:   . Frequency of Communication with Friends and Family:   . Frequency of Social Gatherings with Friends and Family:   . Attends Religious Services:   . Active Member of Clubs or Organizations:   . Attends Banker Meetings:   Marland Kitchen Marital Status:   Intimate Partner Violence:   . Fear of Current or Ex-Partner:   . Emotionally Abused:   Marland Kitchen Physically Abused:   . Sexually Abused:     Outpatient Medications Prior to Visit  Medication Sig Dispense Refill  . albuterol (VENTOLIN HFA) 108 (90 Base) MCG/ACT inhaler Inhale 2 puffs into the lungs every 6 (six) hours as needed for wheezing or shortness of breath. 8 g 2  . aspirin EC 81 MG tablet Take 81 mg by mouth daily.    . busPIRone (BUSPAR) 7.5 MG tablet Take 1 tablet (7.5 mg total) by mouth 2 (two) times daily. 60 tablet 1  . escitalopram (LEXAPRO) 20 MG tablet Take 2 tablets (40 mg total) by mouth daily. 60 tablet 1  . levocetirizine (XYZAL) 5 MG tablet Take 5 mg by mouth daily as needed for allergies.    . meloxicam (MOBIC) 15 MG tablet Take 1 tablet (15 mg total) by mouth daily. 30 tablet 3  . traZODone (DESYREL) 100 MG tablet Take 1 tablet (100 mg total) by mouth at bedtime as needed for sleep. 30 tablet 2  . Vitamin D, Ergocalciferol, (DRISDOL) 1.25 MG (50000 UNIT) CAPS capsule Take 1 capsule (50,000 Units total) by mouth every 7 (seven) days. 5 capsule 6  . valACYclovir (VALTREX) 1000 MG tablet Take 1 tablet (1,000 mg total) by mouth 2 (two) times daily. As needed. (Patient not taking: Reported on 10/15/2019) 20 tablet 2   No facility-administered medications prior to visit.    No Known Allergies  ROS Review of Systems  Constitutional: Negative.   HENT: Negative.   Eyes: Negative.   Respiratory: Negative.   Cardiovascular: Negative.   Gastrointestinal: Negative.   Endocrine: Negative.    Genitourinary: Negative.   Musculoskeletal: Positive for arthralgias (generalized joint pain).  Skin: Negative.   Allergic/Immunologic: Negative.   Neurological: Positive for dizziness (occasional ), weakness (left extremity weakness. ) and headaches (occasional ).  Psychiatric/Behavioral: Negative.       Objective:    Physical Exam  Constitutional: He is oriented to person, place, and time. He appears well-developed and well-nourished.  HENT:  Head: Normocephalic and atraumatic.  Eyes: Conjunctivae are normal.  Neck:  Limited ROM in neck  Cardiovascular: Normal rate, regular rhythm, normal heart sounds and intact distal pulses.  Pulmonary/Chest: Effort normal and breath sounds normal.  Abdominal: Soft. Bowel sounds are normal.  Musculoskeletal:  Comments: Decrease ROM in left extremity  Neurological: He is alert and oriented to person, place, and time. He has normal reflexes.  Skin: Skin is warm and dry.  Psychiatric: He has a normal mood and affect. His behavior is normal. Judgment and thought content normal.  Nursing note and vitals reviewed.   BP 124/68   Pulse 67   Temp 99 F (37.2 C)   Ht 5\' 11"  (1.803 m)   Wt 235 lb 6.4 oz (106.8 kg)   SpO2 100%   BMI 32.83 kg/m  Wt Readings from Last 3 Encounters:  02/17/20 235 lb 6.4 oz (106.8 kg)  10/15/19 242 lb (109.8 kg)  07/21/19 246 lb (111.6 kg)     Health Maintenance Due  Topic Date Due  . COVID-19 Vaccine (1) Never done    There are no preventive care reminders to display for this patient.  Lab Results  Component Value Date   TSH 1.190 10/15/2019   Lab Results  Component Value Date   WBC 12.1 (H) 10/15/2019   HGB 15.2 10/15/2019   HCT 46.0 10/15/2019   MCV 90 10/15/2019   PLT 321 10/15/2019   Lab Results  Component Value Date   NA 135 10/15/2019   K 4.3 10/15/2019   CO2 24 10/15/2019   GLUCOSE 109 (H) 10/15/2019   BUN 8 10/15/2019   CREATININE 1.11 10/15/2019   BILITOT 0.5 10/15/2019    ALKPHOS 50 10/15/2019   AST 15 10/15/2019   ALT 11 10/15/2019   PROT 7.2 10/15/2019   ALBUMIN 4.5 10/15/2019   CALCIUM 9.2 10/15/2019   ANIONGAP 14 11/03/2018   Lab Results  Component Value Date   CHOL 142 10/15/2019   Lab Results  Component Value Date   HDL 64 10/15/2019   Lab Results  Component Value Date   LDLCALC 54 10/15/2019   Lab Results  Component Value Date   TRIG 142 10/15/2019   Lab Results  Component Value Date   CHOLHDL 2.2 10/15/2019   Lab Results  Component Value Date   HGBA1C 5.3 10/25/2018   Assessment & Plan:   1. Essential hypertension The current medical regimen is effective; blood presssure is stable at 124/68 today; continue present plan and medications as prescribed. He will continue to take medications as prescribed, to decrease high sodium intake, excessive alcohol intake, increase potassium intake, smoking cessation, and increase physical activity of at least 30 minutes of cardio activity daily. He will continue to follow Heart Healthy or DASH diet. - POCT urinalysis dipstick  2. Anxiety Mild today. He will continue to follow up with Psychiatry as needed.   3. Chronic back pain, unspecified back location, unspecified back pain laterality - Ambulatory referral to Physical Therapy - Ambulatory referral to Occupational Therapy  4. PTSD (post-traumatic stress disorder)  5. Chronic pain of both lower extremities - Ambulatory referral to Physical Therapy - Ambulatory referral to Occupational Therapy  6. Numbness and tingling - Ambulatory referral to Physical Therapy - Ambulatory referral to Occupational Therapy  7. Follow up He will follow up in 6 months.   No orders of the defined types were placed in this encounter.   Orders Placed This Encounter  Procedures  . Ambulatory referral to Physical Therapy  . Ambulatory referral to Occupational Therapy  . POCT urinalysis dipstick     Referral Orders     Ambulatory referral to  Physical Therapy     Ambulatory referral to Occupational Therapy   Kathe Becton,  MSN, FNP-BC  [facility] [address] [place], [place] [address] [phone]- fax   Problem List Items Addressed This Visit      Cardiovascular and Mediastinum   Essential hypertension - Primary   Relevant Orders   POCT urinalysis dipstick     Other   Anxiety   Numbness and tingling   Relevant Orders   Ambulatory referral to Physical Therapy   Ambulatory referral to Occupational Therapy   PTSD (post-traumatic stress disorder)    Other Visit Diagnoses    Chronic back pain, unspecified back location, unspecified back pain laterality       Relevant Orders   Ambulatory referral to Physical Therapy   Ambulatory referral to Occupational Therapy   Chronic pain of both lower extremities       Relevant Orders   Ambulatory referral to Physical Therapy   Ambulatory referral to Occupational Therapy   Follow up          No orders of the defined types were placed in this encounter.   Follow-up: Return in about 6 months (around 08/19/2020).    Kallie Locks, FNP

## 2020-02-19 ENCOUNTER — Ambulatory Visit (INDEPENDENT_AMBULATORY_CARE_PROVIDER_SITE_OTHER): Payer: Self-pay | Admitting: Licensed Clinical Social Worker

## 2020-02-19 DIAGNOSIS — G8929 Other chronic pain: Secondary | ICD-10-CM | POA: Insufficient documentation

## 2020-02-19 DIAGNOSIS — M79605 Pain in left leg: Secondary | ICD-10-CM | POA: Insufficient documentation

## 2020-02-19 DIAGNOSIS — F4310 Post-traumatic stress disorder, unspecified: Secondary | ICD-10-CM

## 2020-02-20 NOTE — Progress Notes (Signed)
Virtual Visit via Telephone Note  I connected with Vincent Davis on 02/20/20 at  1:00 PM EDT by telephone and verified that I am speaking with the correct person using two identifiers.  Location: Patient: Home  Provider: Office    I discussed the limitations, risks, security and privacy concerns of performing an evaluation and management service by telephone and the availability of in person appointments. I also discussed with the patient that there may be a patient responsible charge related to this service. The patient expressed understanding and agreed to proceed.   THERAPIST PROGRESS NOTE  Session Time: 1:00 pm-1:45 pm  Participation Level: Active  Behavioral Response: CasualAlertAnxious  Type of Therapy: Individual Therapy  Treatment Goals addressed: Coping  Interventions: CBT and Solution Focused  Case Summary: Vincent Davis is a 44 y.o. male who presents oriented x4 (person, place, situation, time and object), casually dressed, appropriately groomed, average height, average weight, and cooperative to address mood and anxiety. Patient has has a history of medical treatment including neck and arm pain, and blood clot. Patient has a history of mental health treatment including outpatient therapy and medication management. Patient denies suicidal and homicidal ideations. Patient admits to psychosis including hearing whispers and seeing shadow figures. Patient denies substance abuse. Patient is at low risk for lethality.   Session#5  Physically: Patient continues to have difficulty with sleep. Patient has been experiencing pain in his arm and his toes.  Spiritually/values: No issues identified.  Relationships: Patient continues to struggle with his relationship with his partner. He feels like she doesn't understand his mental health. Patient feels the same way about members of his family. Patient lost two of his cousins on Mother's day. They drown in [facility].He is  grieving the loss of his cousin and feeling guilt because he is "not the same" and can't go to the funeral or support the family in person.  Emotionally/Mentally/Behavior: Patient is grieving. He feels bad because he is not physically and mentally the same as he was before.   Patient engaged in session. He responded well to interventions. Patient continues to meet criteria for PTSD. Patient will continue in outpatient therapy due to being the least restrictive service to meet his needs at this time. Patient made minimal progress on his goals at this time.   Suicidal/Homicidal: Negativewithout intent/plan  Therapist Response: Therapist reviewed patient's recent thoughts and behaviors. Therapist utilized CBT to address mood and anxiety. Therapist discussed patient's grief related to the loss of his cousins. .  Plan: Return again in 2 weeks.Review treatment plan on or before 07.09.221  Diagnosis: Axis I: Post Traumatic Stress Disorder    Axis II: No diagnosis  I discussed the assessment and treatment plan with the patient. The patient was provided an opportunity to ask questions and all were answered. The patient agreed with the plan and demonstrated an understanding of the instructions.   The patient was advised to call back or seek an in-person evaluation if the symptoms worsen or if the condition fails to improve as anticipated.  I provided 50 minutes of non-face-to-face time during this encounter.  Bynum Bellows, LCSW 02/20/2020

## 2020-03-11 DIAGNOSIS — Z0289 Encounter for other administrative examinations: Secondary | ICD-10-CM

## 2020-03-23 ENCOUNTER — Ambulatory Visit (INDEPENDENT_AMBULATORY_CARE_PROVIDER_SITE_OTHER): Payer: Self-pay | Admitting: Licensed Clinical Social Worker

## 2020-03-23 DIAGNOSIS — F4310 Post-traumatic stress disorder, unspecified: Secondary | ICD-10-CM
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
## 2020-03-30 ENCOUNTER — Telehealth (INDEPENDENT_AMBULATORY_CARE_PROVIDER_SITE_OTHER): Payer: Self-pay | Admitting: Psychiatry

## 2020-03-30 ENCOUNTER — Encounter (HOSPITAL_COMMUNITY): Payer: Self-pay | Admitting: Psychiatry

## 2020-03-30 DIAGNOSIS — F419 Anxiety disorder, unspecified: Secondary | ICD-10-CM

## 2020-03-30 DIAGNOSIS — F4310 Post-traumatic stress disorder, unspecified: Secondary | ICD-10-CM

## 2020-03-30 DIAGNOSIS — G4700 Insomnia, unspecified: Secondary | ICD-10-CM

## 2020-03-30 MED ORDER — ESCITALOPRAM OXALATE 20 MG PO TABS: 40.0000 mg | ORAL_TABLET | Freq: Every day | ORAL | 1 refills | Status: DC

## 2020-03-30 MED ORDER — BUSPIRONE HCL 7.5 MG PO TABS: 7.5000 mg | ORAL_TABLET | Freq: Two times a day (BID) | ORAL | 1 refills | Status: DC

## 2020-03-30 MED ORDER — TRAZODONE HCL 100 MG PO TABS: 100.0000 mg | ORAL_TABLET | Freq: Every evening | ORAL | 2 refills | Status: DC | PRN

## 2020-03-30 MED FILL — TRAZODONE HCL 100 MG TABS: 100 | 30 days supply | Qty: 30 | Fill #0

## 2020-03-30 MED FILL — ESCITALOPRAM 20 MG TABLET: 20 | 30 days supply | Qty: 60 | Fill #0

## 2020-03-30 MED FILL — busPIRone HCL 7.5 MG TABS: 7.5 | 30 days supply | Qty: 60 | Fill #0

## 2020-03-30 NOTE — Progress Notes (Signed)
[facility] Outpatient Follow up visit   Patient Identification: Vincent Davis MRN:  [id number] Date of Evaluation:  03/30/2020 Referral Source: Therapist Chief Complaint:    anxiety follow up  Visit Diagnosis:    ICD-10-CM   1. PTSD (post-traumatic stress disorder)  F43.10   2. Insomnia, unspecified type  G47.00 traZODone (DESYREL) 100 MG tablet  3. Anxiety  F41.9 traZODone (DESYREL) 100 MG tablet       I connected with Vincent Davis on 03/30/20 at 10:15 AM EDT by telephone and verified that I am speaking with the correct person using two identifiers.   I discussed the limitations, risks, security and privacy concerns of performing an evaluation and management service by telephone and the availability of in person appointments. I also discussed with the patient that there may be a patient responsible charge related to this service. The patient expressed understanding and agreed to proceed.  Patient location: home Provider location: home   History of Present Illness: 43  years old currently single African-American male living with his girlfriend, initially referred by his therapist for management of PTSD and depression  Patient had an incident postop  in December 2018 when he went for a cervical surgery C6-C7 "review prior notes if needed.   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 No side effects  Duration since 2018  States not be drinking or using marijuana .  He understands not to drink  Aggravating factor: hospital related  trauma Modifying factors" ,mom,   Otherwise financially difficult situation Duration since December 2018  No prior past psychiatric history before December 2018   Past Psychiatric History: denies prior treatment before 2018  Previous Psychotropic Medications: No   Substance Abuse History in the last 12 months:  No.  Consequences of Substance Abuse: NA  Past Medical History:  Past Medical History:  Diagnosis Date  . Anxiety   . 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 Cervical spondylosis with radiculopathy   . Complication of anesthesia    no previous surgery  . Family history of adverse reaction to anesthesia    "I don't know."  . H/O cervical discectomy   . Obesity (BMI 30.0-34.9)   . Pain and numbness of left upper extremity   . Pneumonia    as a child  . PTSD (post-traumatic stress disorder)   . Vitamin D deficiency 10/2019    Past Surgical History:  Procedure Laterality Date  . ANTERIOR CERVICAL DECOMP/DISCECTOMY FUSION N/A 09/03/2017   Procedure: CERVICAL SIX-SEVEN  ANTERIOR CERVICAL DISCECTOMY AND FUSION, ALLOGRAFT, PLATE;  Surgeon: 14/11/2016, MD;  Location: [facility] OR;  Service: Orthopedics;  Laterality: N/A;  . EVACUATION OF CERVICAL HEMATOMA N/A 09/07/2017   Procedure: EVACUATION OF NECK HEMATOMA;  Surgeon: 14/04/2017, MD;  Location: [facility] OR;  Service: Orthopedics;  Laterality: N/A;  . NO PAST SURGERIES      Family Psychiatric History: denies, but says most of them may have some . coudlnt elaborate any more  Family History:  Family History  Problem Relation Age of  Onset  . Other Mother        Healthy    Social History:   Social History   Socioeconomic History  . Marital status: Single    Spouse name: Not on file  . Number of children: 2  . Years of education: some college  . Highest education level: Not on file  Occupational History    Comment: NA  Tobacco Use  . Smoking status: Former Smoker    Types: Cigarettes  . Smokeless tobacco: Never Used  Vaping Use  .  Vaping Use: Never used  Substance and Sexual Activity  . Alcohol use: Yes    Comment: occasional  . Drug use: Not Currently    Types: Marijuana    Comment: last use 08/29/17  . Sexual activity: Not Currently  Other Topics Concern  . Not on file  Social History Narrative   Lives with sig other   Caffeine- not daily   Social Determinants of Health   Financial Resource Strain:   . Difficulty of Paying Living Expenses:   Food Insecurity:   . Worried About Programme researcher, broadcasting/film/video in the Last Year:   . Barista in the Last Year:   Transportation Needs:   . Freight forwarder (Medical):   Marland Kitchen Lack of Transportation (Non-Medical):   Physical Activity:   . Days of Exercise per Week:   . Minutes of Exercise per Session:   Stress:   . Feeling of Stress :   Social Connections:   . Frequency of Communication with Friends and Family:   . Frequency of Social Gatherings with Friends and Family:   . Attends Religious Services:   . Active Member of Clubs or Organizations:   . Attends Banker Meetings:   Marland Kitchen Marital Status:     Allergies:  No Known Allergies  Metabolic Disorder Labs: Lab Results  Component Value Date   HGBA1C 5.3 10/25/2018   No results found for: PROLACTIN Lab Results  Component Value Date   CHOL 142 10/15/2019   TRIG 142 10/15/2019   HDL 64 10/15/2019   CHOLHDL 2.2 10/15/2019   LDLCALC 54 10/15/2019   LDLCALC 92 04/15/2018   Lab Results  Component Value Date   TSH 1.190 10/15/2019    Therapeutic Level Labs: No results found for: LITHIUM No results found for: CBMZ No results found for: VALPROATE  Current Medications: Current Outpatient Medications  Medication Sig Dispense Refill  . albuterol (VENTOLIN HFA) 108 (90 Base) MCG/ACT inhaler Inhale 2 puffs into the lungs every 6 (six) hours as needed for wheezing or shortness of breath. 8 g 2  . aspirin EC 81 MG tablet Take 81 mg by mouth daily.    . busPIRone (BUSPAR) 7.5 MG tablet Take  1 tablet (7.5 mg total) by mouth 2 (two) times daily. 60 tablet 1  . escitalopram (LEXAPRO) 20 MG tablet Take 2 tablets (40 mg total) by mouth daily. 60 tablet 1  . levocetirizine (XYZAL) 5 MG tablet Take 5 mg by mouth daily as needed for allergies.    . meloxicam (MOBIC) 15 MG tablet Take 1 tablet (15 mg total) by mouth daily. 30 tablet 3  . traZODone (DESYREL) 100 MG tablet Take 1 tablet (100 mg total) by mouth at bedtime as needed for sleep. 30 tablet 2  . valACYclovir (VALTREX) 1000 MG tablet Take 1 tablet (1,000 mg total) by mouth 2 (two) times daily. As needed. (Patient not taking: Reported on 10/15/2019) 20 tablet  2  . Vitamin D, Ergocalciferol, (DRISDOL) 1.25 MG (50000 UNIT) CAPS capsule Take 1 capsule (50,000 Units total) by mouth every 7 (seven) days. 5 capsule 6   No current facility-administered medications for this visit.      Psychiatric Specialty Exam: Review of Systems  Cardiovascular: Negative for chest pain.  Skin: Negative for rash.    There were no vitals taken for this visit.There is no height or weight on file to calculate BMI.  General Appearance:   Eye Contact:    Speech:  Slow  Volume:  Decreased  Mood:  Somewhat subdued  Affect:  Constricted  Thought Process:  Goal Directed  Orientation:  Full (Time, Place, and Person)  Hallucinations as if a shadow at the corner, whisteling sounds at time  Suicidal Thoughts:  No  Homicidal Thoughts:  No  Memory:  Immediate;   Fair Recent;   Fair  Judgement:  Fair  Insight:  Shallow  Psychomotor Activity:  Decreased  Concentration:  Decreased   Recall:  Fair  Fund of Knowledge:Fair  Language: Fair  Akathisia:  No  Handed:  Right  AIMS (if indicated):  not done  Assets:  Desire for Improvement Social Support  ADL's:  Intact  Cognition: WNL  Sleep:  Poor   Screenings: PHQ2-9     Office Visit from 04/25/2019 in [facility] Office Visit from 10/25/2018 in [facility] Office  Visit from 08/13/2018 in [facility] Office Visit from 07/24/2018 in [facility] Office Visit from 05/21/2018 in [facility]  PHQ-2 Total Score 2 2 0 0 0  PHQ-9 Total Score 12 9 -- -- --      Assessment and Plan: as follows PTSD: flashbacks and triggers can make him upset, continue therapy and lexapro  Work on distractions. Discussed possilbe to start less hours less intense job in [employer]   MDD, moderate;somewhat subdued, contine lexapro and therapy Avoid alcohol and abstain,  Therapy to focus on possible some job work , less intense and how to deal with anxiety thoughts  prior joining  Insomnia: reviewed sleep hygiene, continue trazadone  Questions addressed, avoid alcohol   I discussed the assessment and treatment plan with the patient. The patient was provided an opportunity to ask questions and all were answered. The patient agreed with the plan and demonstrated an understanding of the instructions.   The patient was advised to call back or seek an in-person evaluation if the symptoms worsen or if the condition fails to improve as anticipated. Non face to face time spent: 15 min Fu 53m or earlier if needed Thresa Ross, MD 6/29/202110:32 AM

## 2020-04-01 DIAGNOSIS — R1032 Left lower quadrant pain: Secondary | ICD-10-CM

## 2020-04-01 DIAGNOSIS — K5792 Diverticulitis of intestine, part unspecified, without perforation or abscess without bleeding: Secondary | ICD-10-CM

## 2020-04-01 HISTORY — DX: Diverticulitis of intestine, part unspecified, without perforation or abscess without bleeding: K57.92

## 2020-04-01 HISTORY — DX: Left lower quadrant pain: R10.32

## 2020-04-04 ENCOUNTER — Emergency Department (HOSPITAL_COMMUNITY): Payer: Self-pay

## 2020-04-04 ENCOUNTER — Other Ambulatory Visit: Payer: Self-pay

## 2020-04-04 ENCOUNTER — Emergency Department (HOSPITAL_COMMUNITY)
Admission: EM | Admit: 2020-04-04 | Discharge: 2020-04-04 | Disposition: A | Discharge status: Home / Self Care | Payer: Self-pay | Attending: Emergency Medicine | Admitting: Emergency Medicine

## 2020-04-04 DIAGNOSIS — R110 Nausea: Secondary | ICD-10-CM | POA: Insufficient documentation

## 2020-04-04 DIAGNOSIS — K5792 Diverticulitis of intestine, part unspecified, without perforation or abscess without bleeding: Secondary | ICD-10-CM

## 2020-04-04 DIAGNOSIS — R1032 Left lower quadrant pain: Secondary | ICD-10-CM | POA: Insufficient documentation

## 2020-04-04 DIAGNOSIS — I10 Essential (primary) hypertension: Secondary | ICD-10-CM | POA: Insufficient documentation

## 2020-04-04 LAB — URINALYSIS, ROUTINE W REFLEX MICROSCOPIC
Bilirubin Urine: NEGATIVE
Glucose, UA: NEGATIVE mg/dL
Hgb urine dipstick: NEGATIVE
Ketones, ur: NEGATIVE mg/dL
Leukocytes,Ua: NEGATIVE
Nitrite: NEGATIVE
Protein, ur: NEGATIVE mg/dL
Specific Gravity, Urine: 1.013 (ref 1.005–1.030)
pH: 6 (ref 5.0–8.0)

## 2020-04-04 LAB — COMPREHENSIVE METABOLIC PANEL
ALT: 18 U/L (ref 0–44)
AST: 17 U/L (ref 15–41)
Albumin: 4.3 g/dL (ref 3.5–5.0)
Alkaline Phosphatase: 39 U/L (ref 38–126)
Anion gap: 11 (ref 5–15)
BUN: 8 mg/dL (ref 6–20)
CO2: 26 mmol/L (ref 22–32)
Calcium: 8.8 mg/dL — ABNORMAL LOW (ref 8.9–10.3)
Chloride: 100 mmol/L (ref 98–111)
Creatinine, Ser: 1.01 mg/dL (ref 0.61–1.24)
GFR calc Af Amer: >60 mL/min (ref >60)
GFR calc non Af Amer: >60 mL/min (ref >60)
Glucose, Bld: 120 mg/dL — ABNORMAL HIGH (ref 70–99)
Potassium: 4.1 mmol/L (ref 3.5–5.1)
Sodium: 137 mmol/L (ref 135–145)
Total Bilirubin: 0.9 mg/dL (ref 0.3–1.2)
Total Protein: 7.2 g/dL (ref 6.5–8.1)

## 2020-04-04 LAB — CBC WITH DIFFERENTIAL/PLATELET
Abs Immature Granulocytes: 0.06 10*3/uL (ref 0.00–0.07)
Basophils Absolute: 0.1 10*3/uL (ref 0.0–0.1)
Basophils Relative: 0 %
Eosinophils Absolute: 0.2 10*3/uL (ref 0.0–0.5)
Eosinophils Relative: 1 %
HCT: 45 % (ref 39.0–52.0)
Hemoglobin: 14.2 g/dL (ref 13.0–17.0)
Immature Granulocytes: 0 %
Lymphocytes Relative: 13 %
Lymphs Abs: 2 10*3/uL (ref 0.7–4.0)
MCH: 29.6 pg (ref 26.0–34.0)
MCHC: 31.6 g/dL (ref 30.0–36.0)
MCV: 93.8 fL (ref 80.0–100.0)
Monocytes Absolute: 1.5 10*3/uL — ABNORMAL HIGH (ref 0.1–1.0)
Monocytes Relative: 10 %
Neutro Abs: 11.5 10*3/uL — ABNORMAL HIGH (ref 1.7–7.7)
Neutrophils Relative %: 76 %
Platelets: 309 10*3/uL (ref 150–400)
RBC: 4.8 MIL/uL (ref 4.22–5.81)
RDW: 13.2 % (ref 11.5–15.5)
WBC: 15.3 10*3/uL — ABNORMAL HIGH (ref 4.0–10.5)
nRBC: 0 % (ref 0.0–0.2)

## 2020-04-04 LAB — LIPASE, BLOOD: Lipase: 38 U/L (ref 11–51)

## 2020-04-04 MED ORDER — ONDANSETRON HCL 4 MG/2ML IJ SOLN
4.0000 mg | Freq: Once | INTRAMUSCULAR | Status: AC
  Administered 2020-04-04: 4 mg via INTRAVENOUS
  Filled 2020-04-04: qty 2

## 2020-04-04 MED ORDER — HYDROMORPHONE HCL 1 MG/ML IJ SOLN
1.0000 mg | Freq: Once | INTRAMUSCULAR | Status: AC
  Administered 2020-04-04: 1 mg via INTRAVENOUS
  Filled 2020-04-04: qty 1

## 2020-04-04 MED ORDER — IOHEXOL 300 MG/ML  SOLN
100.0000 mL | Freq: Once | INTRAMUSCULAR | Status: AC | PRN
  Administered 2020-04-04: 100 mL via INTRAVENOUS

## 2020-04-04 MED ORDER — CIPROFLOXACIN HCL 500 MG PO TABS
500.0000 mg | ORAL_TABLET | Freq: Two times a day (BID) | ORAL | 0 refills | Status: DC
Start: 2020-04-04 — End: 2020-04-16

## 2020-04-04 MED ORDER — CIPROFLOXACIN HCL 500 MG PO TABS
500.0000 mg | ORAL_TABLET | Freq: Once | ORAL | Status: AC
  Administered 2020-04-04: 500 mg via ORAL
  Filled 2020-04-04: qty 1

## 2020-04-04 MED ORDER — MORPHINE SULFATE (PF) 4 MG/ML IV SOLN
8.0000 mg | Freq: Once | INTRAVENOUS | Status: AC
  Administered 2020-04-04: 8 mg via INTRAVENOUS
  Filled 2020-04-04: qty 2

## 2020-04-04 MED ORDER — SODIUM CHLORIDE 0.9 % IV SOLN: INTRAVENOUS | Status: DC

## 2020-04-04 MED ORDER — METRONIDAZOLE 500 MG PO TABS
500.0000 mg | ORAL_TABLET | Freq: Once | ORAL | Status: AC
  Administered 2020-04-04: 500 mg via ORAL
  Filled 2020-04-04: qty 1

## 2020-04-04 MED ORDER — METRONIDAZOLE 500 MG PO TABS
500.0000 mg | ORAL_TABLET | Freq: Two times a day (BID) | ORAL | 0 refills | Status: DC
Start: 2020-04-04 — End: 2020-04-16

## 2020-04-04 MED ORDER — SODIUM CHLORIDE (PF) 0.9 % IJ SOLN
INTRAMUSCULAR | Status: AC
  Filled 2020-04-04: qty 50

## 2020-04-04 MED ORDER — OXYCODONE-ACETAMINOPHEN 5-325 MG PO TABS: 1.0000 | ORAL_TABLET | ORAL | 0 refills | Status: DC | PRN

## 2020-04-04 NOTE — ED Provider Notes (Signed)
[facility]-EMERGENCY DEPT Provider Note   CSN: [id number] Arrival date & time: 04/04/20  0710     History Chief Complaint  Patient presents with  . Abdominal Pain    Vincent Davis is a 44 y.o. male.  44 year old male presents with severe sharp left lower quadrant abdominal pain which began yesterday.  No fever or chills.  Some nausea no vomiting.  Denies any urinary symptoms peer denies any testicular pain.  States pain is constant and worse with any movement.  No prior history of same.  No treatment use prior to arrival        Past Medical History:  Diagnosis Date  . Anxiety   . 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 Cervical spondylosis with radiculopathy   . Complication of anesthesia    no previous surgery  . Family history of adverse reaction to anesthesia    "I don't know."  . H/O cervical discectomy   . Obesity (BMI 30.0-34.9)   . Pain and numbness of left upper extremity   . Pneumonia    as a child  . PTSD (post-traumatic stress disorder)   . Vitamin D deficiency 10/2019    Patient Active Problem List   Diagnosis Date Noted  . Chronic pain of both lower extremities 02/19/2020  . Essential hypertension 04/27/2019  . Numbness and tingling 04/27/2019  . Anxiety 04/27/2019  . PTSD (post-traumatic stress disorder) 11/04/2018  . Oropharyngeal dysphagia   . Acute renal failure (HCC) 09/07/2017  . Pulmonary edema, acute (HCC)   . Acute hypercapnic respiratory failure (HCC)   . Cardiac arrest due to respiratory disorder (HCC)   . 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 Alcohol use 05/10/2017    Past Surgical History:  Procedure Laterality Date  . ANTERIOR CERVICAL DECOMP/DISCECTOMY FUSION N/A 09/03/2017   Procedure: CERVICAL SIX-SEVEN  ANTERIOR CERVICAL DISCECTOMY AND FUSION, ALLOGRAFT, PLATE;  Surgeon: Eldred Manges, MD;  Location: [facility] OR;  Service: Orthopedics;  Laterality: N/A;  .  EVACUATION OF CERVICAL HEMATOMA N/A 09/07/2017   Procedure: EVACUATION OF NECK HEMATOMA;  Surgeon: Eldred Manges, MD;  Location: [facility] OR;  Service: Orthopedics;  Laterality: N/A;  . NO PAST SURGERIES         Family History  Problem Relation Age of Onset  . Other Mother        Healthy    Social History   Tobacco Use  . Smoking status: Former Smoker    Types: Cigarettes  . Smokeless tobacco: Never Used  Vaping Use  . Vaping Use: Never used  Substance Use Topics  . Alcohol use: Yes    Comment: occasional  . Drug use: Not Currently    Types: Marijuana    Comment: last use 08/29/17    Home Medications Prior to Admission medications   Medication Sig Start Date End Date Taking? Authorizing Provider  albuterol (VENTOLIN HFA) 108 (90 Base) MCG/ACT inhaler Inhale 2 puffs into the lungs every 6 (six) hours as needed for wheezing or shortness of breath. 05/29/19   Kallie Locks, FNP  aspirin EC 81 MG tablet Take 81 mg by mouth daily.    [provider]  busPIRone (BUSPAR) 7.5 MG tablet Take 1 tablet (7.5 mg total) by mouth 2 (two) times daily. 03/30/20 03/30/21  Thresa Ross, MD  escitalopram (LEXAPRO) 20 MG tablet Take 2 tablets (40 mg total) by mouth daily. 03/30/20 03/30/21  Thresa Ross, MD  levocetirizine Elita Boone) 5  MG tablet Take 5 mg by mouth daily as needed for allergies.    [provider]  meloxicam (MOBIC) 15 MG tablet Take 1 tablet (15 mg total) by mouth daily. 05/29/19   Kallie Locks, FNP  traZODone (DESYREL) 100 MG tablet Take 1 tablet (100 mg total) by mouth at bedtime as needed for sleep. 03/30/20   Thresa Ross, MD  valACYclovir (VALTREX) 1000 MG tablet Take 1 tablet (1,000 mg total) by mouth 2 (two) times daily. As needed. Patient not taking: Reported on 10/15/2019 08/12/19   Kallie Locks, FNP  Vitamin D, Ergocalciferol, (DRISDOL) 1.25 MG (50000 UNIT) CAPS capsule Take 1 capsule (50,000 Units total) by mouth every 7 (seven) days. 10/20/19    Kallie Locks, FNP    Allergies    Patient has no known allergies.  Review of Systems   Review of Systems  All other systems reviewed and are negative.   Physical Exam Updated Vital Signs BP (!) 155/81 (BP Location: Right Arm)   Pulse 88   Temp 98.8 F (37.1 C) (Oral)   Resp 18   Ht 1.803 m (5\' 11" )   Wt 106.6 kg   SpO2 100%   BMI 32.78 kg/m   Physical Exam Vitals and nursing note reviewed.  Constitutional:      General: He is not in acute distress.    Appearance: Normal appearance. He is well-developed. He is not toxic-appearing.  HENT:     Head: Normocephalic and atraumatic.  Eyes:     General: Lids are normal.     Conjunctiva/sclera: Conjunctivae normal.     Pupils: Pupils are equal, round, and reactive to light.  Neck:     Thyroid: No thyroid mass.     Trachea: No tracheal deviation.  Cardiovascular:     Rate and Rhythm: Normal rate and regular rhythm.     Heart sounds: Normal heart sounds. No murmur heard.  No gallop.   Pulmonary:     Effort: Pulmonary effort is normal. No respiratory distress.     Breath sounds: Normal breath sounds. No stridor. No decreased breath sounds, wheezing, rhonchi or rales.  Abdominal:     General: Bowel sounds are normal. There is no distension.     Palpations: Abdomen is soft.     Tenderness: There is abdominal tenderness in the left lower quadrant. There is guarding. There is no rebound.    Musculoskeletal:        General: No tenderness. Normal range of motion.     Cervical back: Normal range of motion and neck supple.  Skin:    General: Skin is warm and dry.     Findings: No abrasion or rash.  Neurological:     Mental Status: He is alert and oriented to person, place, and time.     GCS: GCS eye subscore is 4. GCS verbal subscore is 5. GCS motor subscore is 6.     Cranial Nerves: No cranial nerve deficit.     Sensory: No sensory deficit.  Psychiatric:        Speech: Speech normal.        Behavior: Behavior normal.      ED Results / Procedures / Treatments   Labs (all labs ordered are listed, but only abnormal results are displayed) Labs Reviewed  CBC WITH DIFFERENTIAL/PLATELET  COMPREHENSIVE METABOLIC PANEL  LIPASE, BLOOD  URINALYSIS, ROUTINE W REFLEX MICROSCOPIC    EKG None  Radiology No results found.  Procedures Procedures (including critical care  time)  Medications Ordered in ED Medications  0.9 %  sodium chloride infusion (has no administration in time range)  morphine 4 MG/ML injection 8 mg (has no administration in time range)  ondansetron (ZOFRAN) injection 4 mg (has no administration in time range)  0.9 %  sodium chloride infusion (has no administration in time range)    ED Course  I have reviewed the triage vital signs and the nursing notes.  Pertinent labs & imaging results that were available during my care of the patient were reviewed by me and considered in my medical decision making (see chart for details).    MDM Rules/Calculators/A&P                          Patient medicated with IV opiates here.  Labs show a leukocytosis of 15,000.  Abdominal CT shows mild acute diverticulitis.  He was given an oral dose of Cipro and Flagyl and will be placed on same as well as give analgesics.  He was offered admission but has declined at this time Final Clinical Impression(s) / ED Diagnoses Final diagnoses:  None    Rx / DC Orders ED Discharge Orders    None       Lorre Nick, MD 04/04/20 1118

## 2020-04-04 NOTE — ED Triage Notes (Signed)
Patient reports left lower abd pain starting yesterday. Pain rated 10/10. Denies radiation of pain. Patient reports normal bowel movements and urination.

## 2020-04-05 NOTE — ED Notes (Signed)
Pt called regarding not being able to get his Oxycodone due to a written prescription. After discussion with EDP [facility] on [address] was called to verify they will fill prescription. No answer/mailbox full x 2 when trying to update pt.If pt calls back let him know to take the written script to pharmacy on [facility] and they will fill it.

## 2020-04-06 ENCOUNTER — Ambulatory Visit (HOSPITAL_COMMUNITY): Payer: Self-pay | Admitting: Licensed Clinical Social Worker

## 2020-04-07 ENCOUNTER — Other Ambulatory Visit: Payer: Self-pay | Admitting: Family Medicine

## 2020-04-07 DIAGNOSIS — R0602 Shortness of breath: Secondary | ICD-10-CM

## 2020-04-07 MED FILL — TRAZODONE HCL 100 MG TABS: 100 | 30 days supply | Qty: 30 | Fill #0

## 2020-04-07 MED FILL — ESCITALOPRAM 20 MG TABLET: 20 | 30 days supply | Qty: 60 | Fill #0

## 2020-04-07 MED FILL — busPIRone HCL 7.5 MG TABS: 7.5 | 30 days supply | Qty: 60 | Fill #0

## 2020-04-08 MED FILL — ALBUTEROL SULFATE HFA 108 (: 108 (90 BAS | 25 days supply | Qty: 18 | Fill #0

## 2020-04-12 ENCOUNTER — Ambulatory Visit: Payer: Self-pay | Admitting: Family Medicine

## 2020-04-13 ENCOUNTER — Ambulatory Visit: Payer: Self-pay | Admitting: Family Medicine

## 2020-04-16 ENCOUNTER — Other Ambulatory Visit: Payer: Self-pay

## 2020-04-16 ENCOUNTER — Ambulatory Visit (INDEPENDENT_AMBULATORY_CARE_PROVIDER_SITE_OTHER): Payer: Self-pay | Admitting: Family Medicine

## 2020-04-16 VITALS — BP 122/82 | HR 82 | Temp 97.5°F | Ht 71.0 in | Wt 233.0 lb

## 2020-04-16 DIAGNOSIS — G8929 Other chronic pain: Secondary | ICD-10-CM

## 2020-04-16 DIAGNOSIS — K5792 Diverticulitis of intestine, part unspecified, without perforation or abscess without bleeding: Secondary | ICD-10-CM

## 2020-04-16 DIAGNOSIS — M549 Dorsalgia, unspecified: Secondary | ICD-10-CM

## 2020-04-16 DIAGNOSIS — Z09 Encounter for follow-up examination after completed treatment for conditions other than malignant neoplasm: Secondary | ICD-10-CM

## 2020-04-16 DIAGNOSIS — F4310 Post-traumatic stress disorder, unspecified: Secondary | ICD-10-CM

## 2020-04-16 DIAGNOSIS — I10 Essential (primary) hypertension: Secondary | ICD-10-CM

## 2020-04-16 DIAGNOSIS — R1030 Lower abdominal pain, unspecified: Secondary | ICD-10-CM

## 2020-04-16 DIAGNOSIS — F419 Anxiety disorder, unspecified: Secondary | ICD-10-CM

## 2020-04-16 MED ORDER — OXYCODONE-ACETAMINOPHEN 5-325 MG PO TABS: 1.0000 | ORAL_TABLET | Freq: Three times a day (TID) | ORAL | 0 refills | Status: DC | PRN

## 2020-04-16 MED ORDER — CIPROFLOXACIN HCL 500 MG PO TABS: 500.0000 mg | ORAL_TABLET | Freq: Two times a day (BID) | ORAL | 0 refills | Status: DC

## 2020-04-16 MED FILL — CIPROFLOXACIN HCL 500 MG TA: 500 | 7 days supply | Qty: 14 | Fill #0

## 2020-04-16 NOTE — Progress Notes (Signed)
[facility]  Hospital Follow Up   Subjective:  Patient ID: Vincent Davis, male    DOB: 10/15/1975  Age: 44 y.o. MRN: [id number]  CC:  Chief Complaint  Patient presents with  . Follow-up    ED visit 7/4; wants to know if he needs any follow up test.     HPI Vincent Davis is a 44 year old male who presents for Follow Up today.   Patient Active Problem List   Diagnosis Date Noted  . Chronic pain of both lower extremities 02/19/2020  . Essential hypertension 04/27/2019  . Numbness and tingling 04/27/2019  . Anxiety 04/27/2019  . PTSD (post-traumatic stress disorder) 11/04/2018  . Oropharyngeal dysphagia   . Acute renal failure (HCC) 09/07/2017  . Pulmonary edema, acute (HCC)   . Acute hypercapnic respiratory failure (HCC)   . Cardiac arrest due to respiratory disorder (HCC)   . 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 Alcohol use 05/10/2017     Past Medical History:  Diagnosis Date  . Anxiety   . 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 Cervical spondylosis with radiculopathy   . Complication of anesthesia    no previous surgery  . Family history of adverse reaction to anesthesia    "I don't know."  . H/O cervical discectomy   . Obesity (BMI 30.0-34.9)   . Pain and numbness of left upper extremity   . Pneumonia    as a child  . PTSD (post-traumatic stress disorder)   . Vitamin D deficiency 10/2019    Current Status: Since his last office visit, he is doing well with no complaints. He denies fevers, chills, fatigue, recent infections, weight loss, and night sweats. He has not had any headaches, visual changes, dizziness, and falls. No chest pain, heart palpitations, cough and shortness of breath reported. Denies GI problems such as nausea, vomiting, diarrhea, and constipation. He has no reports of blood in stools, dysuria and hematuria. He continues to follow up  Psychiatry as needed. Anxiety is stable today. He denies suicidal ideations, homicidal ideations, or auditory hallucinations. He is taking all medications as prescribed. He denies pain today.   Patient Active Problem List   Diagnosis Date Noted  . Chronic pain of both lower extremities 02/19/2020  . Essential hypertension 04/27/2019  . Numbness and tingling 04/27/2019  . Anxiety 04/27/2019  . PTSD (post-traumatic stress disorder) 11/04/2018  . Oropharyngeal dysphagia   . Acute renal failure (HCC) 09/07/2017  . Pulmonary edema, acute (HCC)   . Acute hypercapnic respiratory failure (HCC)   . Cardiac arrest due to respiratory disorder (HCC)   . 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 Alcohol use 05/10/2017    Past Surgical History:  Procedure Laterality Date  . ANTERIOR CERVICAL DECOMP/DISCECTOMY FUSION N/A 09/03/2017   Procedure: CERVICAL SIX-SEVEN  ANTERIOR CERVICAL DISCECTOMY AND FUSION, ALLOGRAFT, PLATE;  Surgeon: Eldred MangesYates, Mark C, MD;  Location: [facility] OR;  Service: Orthopedics;  Laterality: N/A;  . EVACUATION OF CERVICAL HEMATOMA N/A 09/07/2017   Procedure: EVACUATION OF NECK HEMATOMA;  Surgeon: Eldred MangesYates, Mark C, MD;  Location: [facility] OR;  Service: Orthopedics;  Laterality: N/A;  . NO PAST SURGERIES      Family History  Problem Relation Age of Onset  . Other Mother        Healthy    Social History   Socioeconomic History  . Marital status: Single  Spouse name: Not on file  . Number of children: 2  . Years of education: some college  . Highest education level: Not on file  Occupational History    Comment: NA  Tobacco Use  . Smoking status: Former Smoker    Types: Cigarettes  . Smokeless tobacco: Never Used  Vaping Use  . Vaping Use: Never used  Substance and Sexual Activity  . Alcohol use: Yes    Comment: occasional  . Drug use: Not Currently    Types: Marijuana    Comment: last use 08/29/17  . Sexual activity: Not Currently  Other Topics  Concern  . Not on file  Social History Narrative   Lives with sig other   Caffeine- not daily   Social Determinants of Health   Financial Resource Strain:   . Difficulty of Paying Living Expenses:   Food Insecurity:   . Worried About Programme researcher, broadcasting/film/video in the Last Year:   . Barista in the Last Year:   Transportation Needs:   . Freight forwarder (Medical):   Marland Kitchen Lack of Transportation (Non-Medical):   Physical Activity:   . Days of Exercise per Week:   . Minutes of Exercise per Session:   Stress:   . Feeling of Stress :   Social Connections:   . Frequency of Communication with Friends and Family:   . Frequency of Social Gatherings with Friends and Family:   . Attends Religious Services:   . Active Member of Clubs or Organizations:   . Attends Banker Meetings:   Marland Kitchen Marital Status:   Intimate Partner Violence:   . Fear of Current or Ex-Partner:   . Emotionally Abused:   Marland Kitchen Physically Abused:   . Sexually Abused:     Outpatient Medications Prior to Visit  Medication Sig Dispense Refill  . albuterol (VENTOLIN HFA) 108 (90 Base) MCG/ACT inhaler INHALE 2 PUFFS INTO THE LUNGS EVERY 6 (SIX) HOURS AS NEEDED FOR WHEEZING OR SHORTNESS OF BREATH. 18 g 2  . busPIRone (BUSPAR) 7.5 MG tablet Take 1 tablet (7.5 mg total) by mouth 2 (two) times daily. 60 tablet 1  . escitalopram (LEXAPRO) 20 MG tablet Take 2 tablets (40 mg total) by mouth daily. 60 tablet 1  . levocetirizine (XYZAL) 5 MG tablet Take 5 mg by mouth daily as needed for allergies.    . meloxicam (MOBIC) 15 MG tablet Take 1 tablet (15 mg total) by mouth daily. 30 tablet 3  . traZODone (DESYREL) 100 MG tablet Take 1 tablet (100 mg total) by mouth at bedtime as needed for sleep. 30 tablet 2  . valACYclovir (VALTREX) 1000 MG tablet Take 1 tablet (1,000 mg total) by mouth 2 (two) times daily. As needed. 20 tablet 2  . Vitamin D, Ergocalciferol, (DRISDOL) 1.25 MG (50000 UNIT) CAPS capsule Take 1 capsule  (50,000 Units total) by mouth every 7 (seven) days. 5 capsule 6  . ciprofloxacin (CIPRO) 500 MG tablet Take 1 tablet (500 mg total) by mouth 2 (two) times daily. 14 tablet 0  . metroNIDAZOLE (FLAGYL) 500 MG tablet Take 1 tablet (500 mg total) by mouth 2 (two) times daily. 14 tablet 0  . oxyCODONE-acetaminophen (PERCOCET/ROXICET) 5-325 MG tablet Take 1-2 tablets by mouth every 4 (four) hours as needed for severe pain. 20 tablet 0  . aspirin EC 81 MG tablet Take 81 mg by mouth daily. (Patient not taking: Reported on 04/16/2020)     No facility-administered medications prior to visit.  No Known Allergies  ROS Review of Systems  Constitutional: Negative.   HENT: Negative.   Eyes: Negative.   Respiratory: Negative.   Cardiovascular: Negative.   Gastrointestinal: Negative.   Endocrine: Negative.   Genitourinary: Negative.   Musculoskeletal: Negative.   Skin: Negative.   Allergic/Immunologic: Negative.   Neurological: Positive for dizziness (occasional ) and headaches (occasional ).  Psychiatric/Behavioral: Negative.       Objective:    Physical Exam Vitals and nursing note reviewed.  Constitutional:      Appearance: Normal appearance.  HENT:     Head: Normocephalic and atraumatic.     Mouth/Throat:     Mouth: Mucous membranes are moist.     Pharynx: Oropharynx is clear.  Cardiovascular:     Rate and Rhythm: Normal rate and regular rhythm.     Pulses: Normal pulses.     Heart sounds: Normal heart sounds.  Pulmonary:     Effort: Pulmonary effort is normal.     Breath sounds: Normal breath sounds.  Abdominal:     General: Bowel sounds are normal.     Palpations: Abdomen is soft.  Musculoskeletal:        General: Normal range of motion.     Cervical back: Normal range of motion and neck supple.  Skin:    General: Skin is warm and dry.  Neurological:     General: No focal deficit present.     Mental Status: He is alert and oriented to person, place, and time.   Psychiatric:        Mood and Affect: Mood normal.        Behavior: Behavior normal.        Thought Content: Thought content normal.        Judgment: Judgment normal.     BP 122/82 (BP Location: Right Arm, Patient Position: Sitting, Cuff Size: Large)   Pulse 82   Temp (!) 97.5 F (36.4 C)   Ht 5\' 11"  (1.803 m)   Wt 233 lb (105.7 kg)   SpO2 98%   BMI 32.50 kg/m  Wt Readings from Last 3 Encounters:  04/16/20 233 lb (105.7 kg)  04/04/20 235 lb (106.6 kg)  02/17/20 235 lb 6.4 oz (106.8 kg)     Health Maintenance Due  Topic Date Due  . COVID-19 Vaccine (1) Never done    There are no preventive care reminders to display for this patient.  Lab Results  Component Value Date   TSH 1.190 10/15/2019   Lab Results  Component Value Date   WBC 15.3 (H) 04/04/2020   HGB 14.2 04/04/2020   HCT 45.0 04/04/2020   MCV 93.8 04/04/2020   PLT 309 04/04/2020   Lab Results  Component Value Date   NA 137 04/04/2020   K 4.1 04/04/2020   CO2 26 04/04/2020   GLUCOSE 120 (H) 04/04/2020   BUN 8 04/04/2020   CREATININE 1.01 04/04/2020   BILITOT 0.9 04/04/2020   ALKPHOS 39 04/04/2020   AST 17 04/04/2020   ALT 18 04/04/2020   PROT 7.2 04/04/2020   ALBUMIN 4.3 04/04/2020   CALCIUM 8.8 (L) 04/04/2020   ANIONGAP 11 04/04/2020   Lab Results  Component Value Date   CHOL 142 10/15/2019   Lab Results  Component Value Date   HDL 64 10/15/2019   Lab Results  Component Value Date   LDLCALC 54 10/15/2019   Lab Results  Component Value Date   TRIG 142 10/15/2019   Lab Results  Component  Value Date   CHOLHDL 2.2 10/15/2019   Lab Results  Component Value Date   HGBA1C 5.3 10/25/2018      Assessment & Plan:   1. Hospital discharge follow-up  2. Diverticulitis - oxyCODONE-acetaminophen (PERCOCET/ROXICET) 5-325 MG tablet; Take 1 tablet by mouth every 8 (eight) hours as needed for severe pain.  Dispense: 30 tablet; Refill: 0 - ciprofloxacin (CIPRO) 500 MG tablet; Take 1  tablet (500 mg total) by mouth 2 (two) times daily.  Dispense: 14 tablet; Refill: 0  3. Lower abdominal pain - oxyCODONE-acetaminophen (PERCOCET/ROXICET) 5-325 MG tablet; Take 1 tablet by mouth every 8 (eight) hours as needed for severe pain.  Dispense: 30 tablet; Refill: 0 - ciprofloxacin (CIPRO) 500 MG tablet; Take 1 tablet (500 mg total) by mouth 2 (two) times daily.  Dispense: 14 tablet; Refill: 0  4. Essential hypertension The current medical regimen is effective; blood pressure is stable at 122/82 today; continue present plan and medications as prescribed. He will continue to take medications as prescribed, to decrease high sodium intake, excessive alcohol intake, increase potassium intake, smoking cessation, and increase physical activity of at least 30 minutes of cardio activity daily. He will continue to follow Heart Healthy or DASH diet.  5. Anxiety  6. Chronic back pain, unspecified back location, unspecified back pain laterality  7. PTSD (post-traumatic stress disorder)  8. Follow up He will follow up in 6 months .  Meds ordered this encounter  Medications  . oxyCODONE-acetaminophen (PERCOCET/ROXICET) 5-325 MG tablet    Sig: Take 1 tablet by mouth every 8 (eight) hours as needed for severe pain.    Dispense:  30 tablet    Refill:  0    Order Specific Question:   Supervising Provider    Answer:   Quentin Angst [id number]  . DISCONTD: ciprofloxacin (CIPRO) 500 MG tablet    Sig: Take 1 tablet (500 mg total) by mouth 2 (two) times daily.    Dispense:  14 tablet    Refill:  0  . ciprofloxacin (CIPRO) 500 MG tablet    Sig: Take 1 tablet (500 mg total) by mouth 2 (two) times daily.    Dispense:  14 tablet    Refill:  0   No orders of the defined types were placed in this encounter.   Referral Orders  No referral(s) requested today    .Raliegh Ip,  MSN, FNP-BC [facility] [address] [place], [place] [address] [phone] fax  Problem List Items Addressed This Visit      Cardiovascular and Mediastinum   Essential hypertension     Other   Anxiety   PTSD (post-traumatic stress disorder)    Other Visit Diagnoses    Hospital discharge follow-up    -  Primary   Diverticulitis       Relevant Medications   oxyCODONE-acetaminophen (PERCOCET/ROXICET) 5-325 MG tablet   ciprofloxacin (CIPRO) 500 MG tablet   Lower abdominal pain       Relevant Medications   oxyCODONE-acetaminophen (PERCOCET/ROXICET) 5-325 MG tablet   ciprofloxacin (CIPRO) 500 MG tablet   Chronic back pain, unspecified back location, unspecified back pain laterality       Relevant Medications   oxyCODONE-acetaminophen (PERCOCET/ROXICET) 5-325 MG tablet   Follow up          Meds ordered this encounter  Medications  . oxyCODONE-acetaminophen (PERCOCET/ROXICET) 5-325 MG tablet    Sig: Take 1 tablet by  mouth every 8 (eight) hours as needed for severe pain.    Dispense:  30 tablet    Refill:  0    Order Specific Question:   Supervising Provider    Answer:   Quentin Angst [id number]  . DISCONTD: ciprofloxacin (CIPRO) 500 MG tablet    Sig: Take 1 tablet (500 mg total) by mouth 2 (two) times daily.    Dispense:  14 tablet    Refill:  0  . ciprofloxacin (CIPRO) 500 MG tablet    Sig: Take 1 tablet (500 mg total) by mouth 2 (two) times daily.    Dispense:  14 tablet    Refill:  0    Follow-up: Return in about 6 months (around 10/17/2020) for Labs/OV.    Kallie Locks, FNP

## 2020-04-20 ENCOUNTER — Encounter: Payer: Self-pay | Admitting: Family Medicine

## 2020-04-20 ENCOUNTER — Ambulatory Visit (INDEPENDENT_AMBULATORY_CARE_PROVIDER_SITE_OTHER): Payer: Self-pay | Admitting: Licensed Clinical Social Worker

## 2020-04-20 DIAGNOSIS — K5792 Diverticulitis of intestine, part unspecified, without perforation or abscess without bleeding: Secondary | ICD-10-CM | POA: Insufficient documentation

## 2020-04-20 DIAGNOSIS — G8929 Other chronic pain: Secondary | ICD-10-CM | POA: Insufficient documentation

## 2020-04-20 DIAGNOSIS — F4310 Post-traumatic stress disorder, unspecified: Secondary | ICD-10-CM

## 2020-04-20 DIAGNOSIS — R1030 Lower abdominal pain, unspecified: Secondary | ICD-10-CM | POA: Insufficient documentation

## 2020-04-20 DIAGNOSIS — M549 Dorsalgia, unspecified: Secondary | ICD-10-CM | POA: Insufficient documentation

## 2020-04-20 NOTE — Progress Notes (Signed)
Virtual Visit via Telephone Note  I connected with Vincent Davis on 04/20/20 at  2:00 PM EDT by telephone and verified that I am speaking with the correct person using two identifiers.  Location: Patient: Home  Provider: Office    I discussed the limitations, risks, security and privacy concerns of performing an evaluation and management service by telephone and the availability of in person appointments. I also discussed with the patient that there may be a patient responsible charge related to this service. The patient expressed understanding and agreed to proceed.   THERAPIST PROGRESS NOTE  Session Time: 2:00 pm-2:25 pm  Participation Level: Active  Behavioral Response: CasualAlertAnxious  Type of Therapy: Individual Therapy  Treatment Goals addressed: Coping  Interventions: CBT and Solution Focused  Case Summary: Vincent Davis is a 44 y.o. male who presents oriented x4 (person, place, situation, time and object), casually dressed, appropriately groomed, average height, average weight, and cooperative to address mood and anxiety. Patient has has a history of medical treatment including neck and arm pain, and blood clot. Patient has a history of mental health treatment including outpatient therapy and medication management. Patient denies suicidal and homicidal ideations. Patient admits to psychosis including hearing whispers and seeing shadow figures. Patient denies substance abuse. Patient is at low risk for lethality.   Session#7  Physically: Patient got fully vaccinated. He has been trying to walk more. He continues to have minor pain from his diverticulitis. Patient continues to have difficulty with sleep.  Spiritually/values: No issues identified.  Relationships: Patient is planning on moving. He has looked at options. Patient feels like he can no longer stay at his partner's home.  Emotionally/Mentally/Behavior: Patient was feeling down. He continues to feel  down due to his paranoid and delusional feelings. He feels like it stops him from doing a lot of things including sleep.   Patient engaged in session. He responded well to interventions. Patient continues to meet criteria for PTSD. Patient will continue in outpatient therapy due to being the least restrictive service to meet his needs at this time. Patient made minimal progress on his goals at this time.   Suicidal/Homicidal: Negativewithout intent/plan  Therapist Response: Therapist reviewed patient's recent thoughts and behaviors. Therapist utilized CBT to address mood and anxiety. Therapist discussed with patient his health, and relationships.   Plan: Return again in 2 weeks.Review treatment plan on or before 08.09.221  Diagnosis: Axis I: Post Traumatic Stress Disorder    Axis II: No diagnosis  I discussed the assessment and treatment plan with the patient. The patient was provided an opportunity to ask questions and all were answered. The patient agreed with the plan and demonstrated an understanding of the instructions.   The patient was advised to call back or seek an in-person evaluation if the symptoms worsen or if the condition fails to improve as anticipated.  I provided 25 minutes of non-face-to-face time during this encounter.  Bynum Bellows, LCSW 04/20/2020

## 2020-05-19 ENCOUNTER — Ambulatory Visit (INDEPENDENT_AMBULATORY_CARE_PROVIDER_SITE_OTHER): Payer: Self-pay | Admitting: Licensed Clinical Social Worker

## 2020-05-19 ENCOUNTER — Other Ambulatory Visit: Payer: Self-pay

## 2020-05-19 DIAGNOSIS — F4310 Post-traumatic stress disorder, unspecified: Secondary | ICD-10-CM

## 2020-05-20 NOTE — Progress Notes (Signed)
Virtual Visit via Telephone Note  I connected with Vincent Davis on 05/20/20 at  2:00 PM EDT by telephone and verified that I am speaking with the correct person using two identifiers.  Location: Patient: Home  Provider: Office    I discussed the limitations, risks, security and privacy concerns of performing an evaluation and management service by telephone and the availability of in person appointments. I also discussed with the patient that there may be a patient responsible charge related to this service. The patient expressed understanding and agreed to proceed.   THERAPIST PROGRESS NOTE  Session Time: 2:00 pm-2:45 pm  Participation Level: Active  Behavioral Response: CasualAlertAnxious  Type of Therapy: Individual Therapy  Treatment Goals addressed: Coping  Interventions: CBT and Solution Focused  Case Summary: Vincent Davis is a 44 y.o. male who presents oriented x4 (person, place, situation, time and object), casually dressed, appropriately groomed, average height, average weight, and cooperative to address mood and anxiety. Patient has has a history of medical treatment including neck and arm pain, and blood clot. Patient has a history of mental health treatment including outpatient therapy and medication management. Patient denies suicidal and homicidal ideations. Patient admits to psychosis including hearing whispers and seeing shadow figures. Patient denies substance abuse. Patient is at low risk for lethality.   Session#8  Physically: Patient has not been walking as much. Patient has difficulty with sleep due to paranoia and nightmares. Patient has been seeing things such as shadows. He feels like it is more paranormal. Patient's arm is hurting more. He is feeling exhausted overall.  Spiritually/values: No issues identified.  Relationships: Patient had a cousin pass away earlier this week. He is trying to cope with this.  Emotionally/Mentally/Behavior:  Patient's mood fluctuates. 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 He was unable to keep up, and he had them repeat their instructions numerous times. Patient continues to isolate.    Patient engaged in session. He responded well to interventions. Patient continues to meet criteria for PTSD. Patient will continue in outpatient therapy due to being the least restrictive service to meet his needs at this time. Patient made minimal progress on his goals at this time.   Suicidal/Homicidal: Negativewithout intent/plan  Therapist Response: Therapist reviewed patient's recent thoughts and behaviors. Therapist utilized CBT to address mood and anxiety. Therapist discussed with patient grief, and sleep.    Plan: Return again in 2 weeks.Review treatment plan on or before 08.09.221  Diagnosis: Axis I: Post Traumatic Stress Disorder    Axis II: No diagnosis  I discussed the assessment and treatment plan with the patient. The patient was provided an opportunity to ask questions and all were answered. The patient agreed with the plan and demonstrated an understanding of the instructions.   The patient was advised to call back or seek an in-person evaluation if the symptoms worsen or if the condition fails to improve as anticipated.  I provided 45 minutes of non-face-to-face time during this encounter.  Bynum Bellows, LCSW 05/20/2020

## 2020-06-01 ENCOUNTER — Encounter (HOSPITAL_COMMUNITY): Payer: Self-pay | Admitting: Psychiatry

## 2020-06-01 ENCOUNTER — Telehealth (INDEPENDENT_AMBULATORY_CARE_PROVIDER_SITE_OTHER): Payer: Self-pay | Admitting: Psychiatry

## 2020-06-01 DIAGNOSIS — F331 Major depressive disorder, recurrent, moderate: Secondary | ICD-10-CM

## 2020-06-01 DIAGNOSIS — G4700 Insomnia, unspecified: Secondary | ICD-10-CM

## 2020-06-01 DIAGNOSIS — F4310 Post-traumatic stress disorder, unspecified: Secondary | ICD-10-CM

## 2020-06-01 MED ORDER — BUSPIRONE HCL 7.5 MG PO TABS: 7.5000 mg | ORAL_TABLET | Freq: Two times a day (BID) | ORAL | 1 refills | Status: DC

## 2020-06-01 MED ORDER — LAMOTRIGINE 25 MG PO TABS
25.0000 mg | ORAL_TABLET | Freq: Every day | ORAL | 0 refills | Status: DC
Start: 2020-06-01 — End: 2020-07-06

## 2020-06-01 MED ORDER — ESCITALOPRAM OXALATE 20 MG PO TABS: 40.0000 mg | ORAL_TABLET | Freq: Every day | ORAL | 1 refills | Status: DC

## 2020-06-01 MED FILL — ESCITALOPRAM 20 MG TABLET: 20 | 30 days supply | Qty: 60 | Fill #0

## 2020-06-01 MED FILL — lamoTRIgine 25 MG TABS: 25 | 30 days supply | Qty: 60 | Fill #0

## 2020-06-01 MED FILL — busPIRone HCL 7.5 MG TABS: 7.5 | 30 days supply | Qty: 60 | Fill #0

## 2020-06-01 NOTE — Progress Notes (Signed)
[facility]   Patient Identification: Vincent Davis MRN:  [id number] Date of Evaluation:  06/01/2020 Referral Source: Therapist Chief Complaint:    depression and anxiety follow up Visit Diagnosis:    ICD-10-CM   1. PTSD (post-traumatic stress disorder)  F43.10   2. Insomnia, unspecified type  G47.00   3. MDD (major depressive disorder), recurrent episode, moderate (HCC)  F33.1        I connected with Vincent DyerSamuel Keith Labate on 06/01/20 at 10:00 AM EDT by telephone and verified that I am speaking with the correct person using two identifiers.  I discussed the limitations, risks, security and privacy concerns of performing an evaluation and management service by telephone and the availability of in person appointments. I also discussed with the patient that there may be a patient responsible charge related to this service. The patient expressed understanding and agreed to proceed.  Patient location: home Provider location: home office   History of Present Illness: 44  years old currently single African-American male living with his girlfriend, initially referred by his therapist for management of PTSD and depression  Patient had an incident postop  in December 2018 when he went for a cervical surgery C6-C7 "review prior notes if needed.   Is in therapy with Josh working on trauma and its effect Conflicts or related incidents makes him anxious  He is out of job since then, says tried to work but coudnt focus and was let go again [employer]els unreal and moody at times,  Had incident with the ladys son where he was living and conflicts and says best was to leave that place Feels subdued, frustrated due to no job and finacnes, living with a friend right now  buspar helps some anxiety, avoids people, crowds , ran low and wants refill  feels stuck and financial situation adding up not able to afford things  Dwells on incident and tries to distract On lexapro 40mg . No  side effects  Duration since 2018  States not be drinking or using marijuana  He understands not to drink  Aggravating factor: hospital related  trauma Modifying factors" mom,  Otherwise financially difficult situation Duration since December 2018  No prior past psychiatric history before December 2018   Past Psychiatric History: denies prior treatment before 2018  Previous Psychotropic Medications: No   Substance Abuse History in the last 12 months:  No.  Consequences of Substance Abuse: NA  Past Medical History:  Past Medical History:  Diagnosis Date  . Anxiety   . 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 Cervical spondylosis with radiculopathy   . Complication of anesthesia    no previous surgery  . Diverticulitis 04/2020  . Family history of adverse reaction to anesthesia    "I don't know."  . H/O cervical discectomy   . Left lower quadrant abdominal pain 04/2020  . Obesity (BMI 30.0-34.9)   . Pain and numbness of left upper extremity   . Pneumonia    as a child  . PTSD (post-traumatic stress disorder)   . Vitamin D deficiency 10/2019    Past Surgical History:  Procedure Laterality Date  . ANTERIOR CERVICAL DECOMP/DISCECTOMY FUSION N/A 09/03/2017   Procedure: CERVICAL SIX-SEVEN  ANTERIOR CERVICAL DISCECTOMY AND FUSION, ALLOGRAFT, PLATE;  Surgeon: Eldred MangesYates, Mark C, MD;  Location: [facility] OR;  Service: Orthopedics;  Laterality: N/A;  . EVACUATION OF CERVICAL HEMATOMA N/A 09/07/2017   Procedure: EVACUATION OF NECK HEMATOMA;  Surgeon: Eldred MangesYates, Mark C, MD;  Location: [facility] OR;  Service: Orthopedics;  Laterality: N/A;  . NO PAST SURGERIES      Family Psychiatric History: denies, but says most of them may have some . coudlnt elaborate any more  Family History:  Family History  Problem Relation Age of Onset  . Other Mother        Healthy    Social History:   Social History   Socioeconomic History  . Marital status: Single    Spouse name: Not on file  .  Number of children: 2  . Years of education: some college  . Highest education level: Not on file  Occupational History    Comment: NA  Tobacco Use  . Smoking status: Former Smoker    Types: Cigarettes  . Smokeless tobacco: Never Used  Vaping Use  . Vaping Use: Never used  Substance and Sexual Activity  . Alcohol use: Yes    Comment: occasional  . Drug use: Not Currently    Types: Marijuana    Comment: last use 08/29/17  . Sexual activity: Not Currently  Other Topics Concern  . Not on file  Social History Narrative   Lives with sig other   Caffeine- not daily   Social Determinants of Health   Financial Resource Strain:   . Difficulty of Paying Living Expenses: Not on file  Food Insecurity:   . Worried About Programme researcher, broadcasting/film/video in the Last Year: Not on file  . Ran Out of Food in the Last Year: Not on file  Transportation Needs:   . Lack of Transportation (Medical): Not on file  . Lack of Transportation (Non-Medical): Not on file  Physical Activity:   . Days of Exercise per Week: Not on file  . Minutes of Exercise per Session: Not on file  Stress:   . Feeling of Stress : Not on file  Social Connections:   . Frequency of Communication with Friends and Family: Not on file  . Frequency of Social Gatherings with Friends and Family: Not on file  . Attends Religious Services: Not on file  . Active Member of Clubs or Organizations: Not on file  . Attends Banker Meetings: Not on file  . Marital Status: Not on file    Allergies:  No Known Allergies  Metabolic Disorder Labs: Lab Results  Component Value Date   HGBA1C 5.3 10/25/2018   No results found for: PROLACTIN Lab Results  Component Value Date   CHOL 142 10/15/2019   TRIG 142 10/15/2019   HDL 64 10/15/2019   CHOLHDL 2.2 10/15/2019   LDLCALC 54 10/15/2019   LDLCALC 92 04/15/2018   Lab Results  Component Value Date   TSH 1.190 10/15/2019    Therapeutic Level Labs: No results found for:  LITHIUM No results found for: CBMZ No results found for: VALPROATE  Current Medications: Current Outpatient Medications  Medication Sig Dispense Refill  . albuterol (VENTOLIN HFA) 108 (90 Base) MCG/ACT inhaler INHALE 2 PUFFS INTO THE LUNGS EVERY 6 (SIX) HOURS AS NEEDED FOR WHEEZING OR SHORTNESS OF BREATH. 18 g 2  . aspirin EC 81 MG tablet Take 81 mg by mouth daily. (Patient not taking: Reported on 04/16/2020)    . busPIRone (BUSPAR) 7.5 MG tablet Take 1 tablet (7.5 mg total) by mouth 2 (two) times daily. 60 tablet 1  . ciprofloxacin (CIPRO) 500 MG tablet Take 1 tablet (500 mg total) by mouth 2 (two) times daily. 14 tablet 0  . escitalopram (LEXAPRO) 20 MG tablet  Take 2 tablets (40 mg total) by mouth daily. 60 tablet 1  . lamoTRIgine (LAMICTAL) 25 MG tablet Take 1 tablet (25 mg total) by mouth daily. Take one tablet daily for a week and then start taking 2 tablets. 60 tablet 0  . levocetirizine (XYZAL) 5 MG tablet Take 5 mg by mouth daily as needed for allergies.    . meloxicam (MOBIC) 15 MG tablet Take 1 tablet (15 mg total) by mouth daily. 30 tablet 3  . oxyCODONE-acetaminophen (PERCOCET/ROXICET) 5-325 MG tablet Take 1 tablet by mouth every 8 (eight) hours as needed for severe pain. 30 tablet 0  . traZODone (DESYREL) 100 MG tablet Take 1 tablet (100 mg total) by mouth at bedtime as needed for sleep. 30 tablet 2  . valACYclovir (VALTREX) 1000 MG tablet Take 1 tablet (1,000 mg total) by mouth 2 (two) times daily. As needed. 20 tablet 2  . Vitamin D, Ergocalciferol, (DRISDOL) 1.25 MG (50000 UNIT) CAPS capsule Take 1 capsule (50,000 Units total) by mouth every 7 (seven) days. 5 capsule 6   No current facility-administered medications for this visit.      Psychiatric Specialty Exam: Review of Systems  Cardiovascular: Negative for chest pain.  Skin: Negative for rash.  Psychiatric/Behavioral: Positive for depression. The patient has insomnia.     There were no vitals taken for this  visit.There is no height or weight on file to calculate BMI.  General Appearance:   Eye Contact:    Speech:  Slow  Volume:  Decreased  Mood:  subdued  Affect:  Constricted  Thought Process:  Goal Directed  Orientation:  Full (Time, Place, and Person)  Sees shadows, at times feel unreal about himself  Suicidal Thoughts:  No  Homicidal Thoughts:  No  Memory:  Immediate;   Fair Recent;   Fair  Judgement:  Fair  Insight:  Shallow  Psychomotor Activity:  Decreased  Concentration:  Decreased   Recall:  Fair  Fund of Knowledge:Fair  Language: Fair  Akathisia:  No  Handed:  Right  AIMS (if indicated):  not done  Assets:  Desire for Improvement Social Support  ADL's:  Intact  Cognition: WNL  Sleep:  Poor   Screenings: PHQ2-9     Office Visit from 04/16/2020 in [facility] Office Visit from 04/25/2019 in [facility] Office Visit from 10/25/2018 in [facility] Office Visit from 08/13/2018 in [facility] Office Visit from 07/24/2018 in [facility]  PHQ-2 Total Score 5 2 2  0 0  PHQ-9 Total Score 18 12 9  -- --      Assessment and Plan: as follows PTSD: flashbacks and feels unreal at times. Continue therapy and work on distractions .  Continue lexapro, financial stress is adding to mood  MDD, subdued, continue lexapro, see above will add lamictal, discussed rash, small dose Avoid alcohol and abstain,   Therapy to focus on possible some job work , less intense and how to deal with anxiety thoughts  prior joining  Insomnia: reviewed sleep hygiene, continue trazadone  Questions addressed, avoid alcohol   I discussed the assessment and treatment plan with the patient. The patient was provided an opportunity to ask questions and all were answered. The patient agreed with the plan and demonstrated an understanding of the instructions.   The patient was advised to call back or seek an  in-person evaluation if the symptoms worsen or if the condition fails  to improve as anticipated. Non face to face time spent: 15 min Fu 76m or earlier if needed Thresa Ross, MD 8/31/202110:10 AM

## 2020-06-02 ENCOUNTER — Ambulatory Visit (HOSPITAL_COMMUNITY): Payer: Self-pay | Admitting: Licensed Clinical Social Worker

## 2020-06-16 ENCOUNTER — Ambulatory Visit (HOSPITAL_COMMUNITY): Payer: Self-pay | Admitting: Licensed Clinical Social Worker

## 2020-07-06 ENCOUNTER — Encounter (HOSPITAL_COMMUNITY): Payer: Self-pay | Admitting: Psychiatry

## 2020-07-06 ENCOUNTER — Telehealth (INDEPENDENT_AMBULATORY_CARE_PROVIDER_SITE_OTHER): Payer: Self-pay | Admitting: Psychiatry

## 2020-07-06 DIAGNOSIS — F4310 Post-traumatic stress disorder, unspecified: Secondary | ICD-10-CM

## 2020-07-06 DIAGNOSIS — F331 Major depressive disorder, recurrent, moderate: Secondary | ICD-10-CM

## 2020-07-06 DIAGNOSIS — G4700 Insomnia, unspecified: Secondary | ICD-10-CM

## 2020-07-06 MED ORDER — LAMOTRIGINE 25 MG PO TABS
75.0000 mg | ORAL_TABLET | Freq: Every day | ORAL | 0 refills | Status: DC
Start: 2020-07-06 — End: 2020-09-01

## 2020-07-06 MED FILL — lamoTRIgine 25 MG TABS: 25 | 30 days supply | Qty: 90 | Fill #0

## 2020-07-06 NOTE — Progress Notes (Signed)
[facility] Outpatient Follow up visit   Patient Identification: Vincent Davis MRN:  [id number] Date of Evaluation:  07/06/2020 Referral Source: Therapist Chief Complaint:    depression and anxiety follow up Visit Diagnosis:    ICD-10-CM   1. PTSD (post-traumatic stress disorder)  F43.10   2. Insomnia, unspecified type  G47.00   3. MDD (major depressive disorder), recurrent episode, moderate (HCC)  F33.1        I connected with Hinton Dyer on 07/06/20 at  1:30 PM EDT by telephone and verified that I am speaking with the correct person using two identifiers.  I discussed the limitations, risks, security and privacy concerns of performing an evaluation and management service by telephone and the availability of in person appointments. I also discussed with the patient that there may be a patient responsible charge related to this service. The patient expressed understanding and agreed to proceed.  Patient location: home Provider location: home office   History of Present Illness: 44  years old currently single African-American male living with his girlfriend, initially referred by his therapist for management of PTSD and depression  Patient had an incident postop  in December 2018 when he went for a cervical surgery C6-C7 "review prior notes if needed.   Is in therapy with Josh working on trauma and its effect Conflicts or related incidents makes him anxious  Out of job, cant focus and flashbacks from past trauma makes him feel unreal  Added lamictal last time, some better days and then still have bad days   buspar helps some anxiety but still has worries, or Dwells on incident and tries to distract On lexapro 40mg . No side effects  Duration since 2018  States not be drinking or using marijuana  He understands not to drink  Aggravating factor: hospital related  trauma Modifying factors" mom ,  Otherwise financially difficult situation Duration since December  2018  No prior past psychiatric history before December 2018   Past Psychiatric History: denies prior treatment before 2018  Previous Psychotropic Medications: No   Substance Abuse History in the last 12 months:  No.  Consequences of Substance Abuse: NA  Past Medical History:  Past Medical History:  Diagnosis Date  . Anxiety   . 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 Cervical spondylosis with radiculopathy   . Complication of anesthesia    no previous surgery  . Diverticulitis 04/2020  . Family history of adverse reaction to anesthesia    "I don't know."  . H/O cervical discectomy   . Left lower quadrant abdominal pain 04/2020  . Obesity (BMI 30.0-34.9)   . Pain and numbness of left upper extremity   . Pneumonia    as a child  . PTSD (post-traumatic stress disorder)   . Vitamin D deficiency 10/2019    Past Surgical History:  Procedure Laterality Date  . ANTERIOR CERVICAL DECOMP/DISCECTOMY FUSION N/A 09/03/2017   Procedure: CERVICAL SIX-SEVEN  ANTERIOR CERVICAL DISCECTOMY AND FUSION, ALLOGRAFT, PLATE;  Surgeon: 14/11/2016, MD;  Location: [facility] OR;  Service: Orthopedics;  Laterality: N/A;  . EVACUATION OF CERVICAL HEMATOMA N/A 09/07/2017   Procedure: EVACUATION OF NECK HEMATOMA;  Surgeon: 14/04/2017, MD;  Location: [facility] OR;  Service: Orthopedics;  Laterality: N/A;  . NO PAST SURGERIES      Family Psychiatric History: denies, but says most of them may have some . coudlnt elaborate any more  Family History:  Family History  Problem Relation Age of  Onset  . Other Mother        Healthy    Social History:   Social History   Socioeconomic History  . Marital status: Single    Spouse name: Not on file  . Number of children: 2  . Years of education: some college  . Highest education level: Not on file  Occupational History    Comment: NA  Tobacco Use  . Smoking status: Former Smoker    Types: Cigarettes  . Smokeless tobacco: Never Used  Vaping Use   . Vaping Use: Never used  Substance and Sexual Activity  . Alcohol use: Yes    Comment: occasional  . Drug use: Not Currently    Types: Marijuana    Comment: last use 08/29/17  . Sexual activity: Not Currently  Other Topics Concern  . Not on file  Social History Narrative   Lives with sig other   Caffeine- not daily   Social Determinants of Health   Financial Resource Strain:   . Difficulty of Paying Living Expenses: Not on file  Food Insecurity:   . Worried About Programme researcher, broadcasting/film/video in the Last Year: Not on file  . Ran Out of Food in the Last Year: Not on file  Transportation Needs:   . Lack of Transportation (Medical): Not on file  . Lack of Transportation (Non-Medical): Not on file  Physical Activity:   . Days of Exercise per Week: Not on file  . Minutes of Exercise per Session: Not on file  Stress:   . Feeling of Stress : Not on file  Social Connections:   . Frequency of Communication with Friends and Family: Not on file  . Frequency of Social Gatherings with Friends and Family: Not on file  . Attends Religious Services: Not on file  . Active Member of Clubs or Organizations: Not on file  . Attends Banker Meetings: Not on file  . Marital Status: Not on file    Allergies:  No Known Allergies  Metabolic Disorder Labs: Lab Results  Component Value Date   HGBA1C 5.3 10/25/2018   No results found for: PROLACTIN Lab Results  Component Value Date   CHOL 142 10/15/2019   TRIG 142 10/15/2019   HDL 64 10/15/2019   CHOLHDL 2.2 10/15/2019   LDLCALC 54 10/15/2019   LDLCALC 92 04/15/2018   Lab Results  Component Value Date   TSH 1.190 10/15/2019    Therapeutic Level Labs: No results found for: LITHIUM No results found for: CBMZ No results found for: VALPROATE  Current Medications: Current Outpatient Medications  Medication Sig Dispense Refill  . albuterol (VENTOLIN HFA) 108 (90 Base) MCG/ACT inhaler INHALE 2 PUFFS INTO THE LUNGS EVERY 6 (SIX)  HOURS AS NEEDED FOR WHEEZING OR SHORTNESS OF BREATH. 18 g 2  . aspirin EC 81 MG tablet Take 81 mg by mouth daily. (Patient not taking: Reported on 04/16/2020)    . busPIRone (BUSPAR) 7.5 MG tablet Take 1 tablet (7.5 mg total) by mouth 2 (two) times daily. 60 tablet 1  . ciprofloxacin (CIPRO) 500 MG tablet Take 1 tablet (500 mg total) by mouth 2 (two) times daily. 14 tablet 0  . escitalopram (LEXAPRO) 20 MG tablet Take 2 tablets (40 mg total) by mouth daily. 60 tablet 1  . lamoTRIgine (LAMICTAL) 25 MG tablet Take 3 tablets (75 mg total) by mouth daily. 90 tablet 0  . levocetirizine (XYZAL) 5 MG tablet Take 5 mg by mouth daily as needed for  allergies.    . meloxicam (MOBIC) 15 MG tablet Take 1 tablet (15 mg total) by mouth daily. 30 tablet 3  . oxyCODONE-acetaminophen (PERCOCET/ROXICET) 5-325 MG tablet Take 1 tablet by mouth every 8 (eight) hours as needed for severe pain. 30 tablet 0  . traZODone (DESYREL) 100 MG tablet Take 1 tablet (100 mg total) by mouth at bedtime as needed for sleep. 30 tablet 2  . valACYclovir (VALTREX) 1000 MG tablet Take 1 tablet (1,000 mg total) by mouth 2 (two) times daily. As needed. 20 tablet 2  . Vitamin D, Ergocalciferol, (DRISDOL) 1.25 MG (50000 UNIT) CAPS capsule Take 1 capsule (50,000 Units total) by mouth every 7 (seven) days. 5 capsule 6   No current facility-administered medications for this visit.      Psychiatric Specialty Exam: Review of Systems  Cardiovascular: Negative for chest pain.  Skin: Negative for rash.  Psychiatric/Behavioral: The patient has insomnia.     There were no vitals taken for this visit.There is no height or weight on file to calculate BMI.  General Appearance:   Eye Contact:    Speech:  Slow  Volume:  Decreased  Mood:  Somewhat subdued  Affect:  Constricted  Thought Process:  Goal Directed  Orientation:  Full (Time, Place, and Person)  Sees shadows, at times feel unreal about himself  Suicidal Thoughts:  No  Homicidal  Thoughts:  No  Memory:  Immediate;   Fair Recent;   Fair  Judgement:  Fair  Insight:  Shallow  Psychomotor Activity:  Decreased  Concentration:  Decreased   Recall:  Fair  Fund of Knowledge:Fair  Language: Fair  Akathisia:  No  Handed:  Right  AIMS (if indicated):  not done  Assets:  Desire for Improvement Social Support  ADL's:  Intact  Cognition: WNL  Sleep:  Poor   Screenings: PHQ2-9     Office Visit from 04/16/2020 in [facility] Office Visit from 04/25/2019 in [facility] Office Visit from 10/25/2018 in [facility] Office Visit from 08/13/2018 in [facility] Office Visit from 07/24/2018 in [facility]  PHQ-2 Total Score 5 2 2  0 0  PHQ-9 Total Score 18 12 9  -- --      Assessment and Plan: as follows PTSD: feels unreal at times, triggers effect him. Continue lexapro, therapy and buspar MDD, somewhat subdued, increase lamictal to 75mg , has helped some Avoid alcohol and abstain,   Therapy to focus on possible some job work  Insomnia: reviewed sleep hygiene, continue trazadone  Questions addressed, avoid alcohol   I discussed the assessment and treatment plan with the patient. The patient was provided an opportunity to ask questions and all were answered. The patient agreed with the plan and demonstrated an understanding of the instructions.   The patient was advised to call back or seek an in-person evaluation if the symptoms worsen or if the condition fails to improve as anticipated. Non face to face time spent: 15 min Fu 6 weeks  or earlier if needed , MD 10/5/20211:44 PM

## 2020-07-08 ENCOUNTER — Ambulatory Visit (INDEPENDENT_AMBULATORY_CARE_PROVIDER_SITE_OTHER): Payer: Self-pay | Admitting: Licensed Clinical Social Worker

## 2020-07-08 DIAGNOSIS — F4310 Post-traumatic stress disorder, unspecified: Secondary | ICD-10-CM

## 2020-07-09 NOTE — Progress Notes (Signed)
Virtual Visit via Telephone Note  I connected with Vincent Davis on 07/09/20 at 10:00 AM EDT by telephone and verified that I am speaking with the correct person using two identifiers.  Location: Patient: Home  Provider: Office    I discussed the limitations, risks, security and privacy concerns of performing an evaluation and management service by telephone and the availability of in person appointments. I also discussed with the patient that there may be a patient responsible charge related to this service. The patient expressed understanding and agreed to proceed.   THERAPIST PROGRESS NOTE  Session Time: 10:00 am-10:45 am  Participation Level: Active  Behavioral Response: CasualAlertAnxious  Type of Therapy: Individual Therapy  Treatment Goals addressed: Coping  Interventions: CBT and Solution Focused  Case Summary: Vincent Davis is a 44 y.o. male who presents oriented x4 (person, place, situation, time and object), casually dressed, appropriately groomed, average height, average weight, and cooperative to address mood and anxiety. Patient has has a history of medical treatment including neck and arm pain, and blood clot. Patient has a history of mental health treatment including outpatient therapy and medication management. Patient denies suicidal and homicidal ideations. Patient admits to psychosis including hearing whispers and seeing shadow figures. Patient denies substance abuse. Patient is at low risk for lethality.   Session#9  Physically: Patient continues to have physical pain that makes it difficult to use his arm. He is going to look into physical therapy to hopefully help his arm and gain some strength or at least reduce some of the pain.  Spiritually/values: No issues identified.  Relationships: Patient has not been getting along with his girlfriend. He feels like he should move out but doesn't have the opportunity to due to lack of money. He feels stuck.   Emotionally/Mentally/Behavior: Patient's mood fluctuates. Patient has had panic attacks with his pain or if his girlfriend yells/argues. He is trying to avoid arguments the best that he can and finds himself apologizing for things that are not his fault. He wants to look for housing and is going to look into government programs for help. Patient also needs help with his phone. A friend had been paying for it and they are going to stop paying for it in a few weeks.    Patient engaged in session. He responded well to interventions. Patient continues to meet criteria for PTSD. Patient will continue in outpatient therapy due to being the least restrictive service to meet his needs at this time. Patient made minimal progress on his goals at this time.   Suicidal/Homicidal: Negativewithout intent/plan  Therapist Response: Therapist reviewed patient's recent thoughts and behaviors. Therapist utilized CBT to address mood and anxiety. Therapist discussed with patient physical pain, and relationships.   Plan: Return again in 2 weeks.  Diagnosis: Axis I: Post Traumatic Stress Disorder    Axis II: No diagnosis  I discussed the assessment and treatment plan with the patient. The patient was provided an opportunity to ask questions and all were answered. The patient agreed with the plan and demonstrated an understanding of the instructions.   The patient was advised to call back or seek an in-person evaluation if the symptoms worsen or if the condition fails to improve as anticipated.  I provided 45 minutes of non-face-to-face time during this encounter.  Bynum Bellows, LCSW 07/09/2020

## 2020-07-29 ENCOUNTER — Ambulatory Visit (INDEPENDENT_AMBULATORY_CARE_PROVIDER_SITE_OTHER): Payer: Self-pay | Admitting: Licensed Clinical Social Worker

## 2020-07-29 DIAGNOSIS — F4310 Post-traumatic stress disorder, unspecified: Secondary | ICD-10-CM

## 2020-07-29 NOTE — Progress Notes (Signed)
Virtual Visit via Telephone Note  I connected with Vincent Davis on 07/29/20 at 10:00 AM EDT by telephone and verified that I am speaking with the correct person using two identifiers.  Location: Patient: Home  Provider: Office    I discussed the limitations, risks, security and privacy concerns of performing an evaluation and management service by telephone and the availability of in person appointments. I also discussed with the patient that there may be a patient responsible charge related to this service. The patient expressed understanding and agreed to proceed.   THERAPIST PROGRESS NOTE  Session Time: 10:20 am-10:40 am  Participation Level: Active  Behavioral Response: CasualAlertAnxious  Type of Therapy: Individual Therapy  Treatment Goals addressed: Coping  Interventions: CBT and Solution Focused  Case Summary: Vincent Davis is a 44 y.o. male who presents oriented x4 (person, place, situation, time and object), casually dressed, appropriately groomed, average height, average weight, and cooperative to address mood and anxiety. Patient has has a history of medical treatment including neck and arm pain, and blood clot. Patient has a history of mental health treatment including outpatient therapy and medication management. Patient denies suicidal and homicidal ideations. Patient admits to psychosis including hearing whispers and seeing shadow figures. Patient denies substance abuse. Patient is at low risk for lethality.   Session#10  Physically: Patient continues to struggle with his physical pain. It makes it hard for him to function. 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 He tried previously but couldn't keep up physically. He hopes that he will be able to keep up physically. His sleep is disrupted by nightmares and physical pain.  Spiritually/values: No issues identified.  Relationships: Patient is not getting along with his  girlfriend and wants to move out. He has tried to call a shelter but they are full. Emotionally/Mentally/Behavior: Patient's mood has been down and irritable. He is frustrated and feeling stuck in life. He has physical ailments which make it hard to work, his mental health makes it difficult to function, and his lack of income makes it different to find housing, etc.     Patient engaged in session. He responded well to interventions. Patient continues to meet criteria for PTSD. Patient will continue in outpatient therapy due to being the least restrictive service to meet his needs at this time. Patient made minimal progress on his goals at this time.   Suicidal/Homicidal: Negativewithout intent/plan  Therapist Response: Therapist reviewed patient's recent thoughts and behaviors. Therapist utilized CBT to address mood and anxiety. Therapist discussed with patient physical pain, relationships, and trying to work with his chronic pain.   Plan: Return again in 2 weeks.  Diagnosis: Axis I: Post Traumatic Stress Disorder    Axis II: No diagnosis  I discussed the assessment and treatment plan with the patient. The patient was provided an opportunity to ask questions and all were answered. The patient agreed with the plan and demonstrated an understanding of the instructions.   The patient was advised to call back or seek an in-person evaluation if the symptoms worsen or if the condition fails to improve as anticipated.  I provided 20 minutes of non-face-to-face time during this encounter.  Bynum Bellows, LCSW 07/29/2020

## 2020-08-12 ENCOUNTER — Ambulatory Visit (HOSPITAL_COMMUNITY): Payer: Self-pay | Admitting: Licensed Clinical Social Worker

## 2020-08-18 ENCOUNTER — Ambulatory Visit: Payer: Self-pay | Admitting: Family Medicine

## 2020-08-24 ENCOUNTER — Ambulatory Visit (INDEPENDENT_AMBULATORY_CARE_PROVIDER_SITE_OTHER): Payer: Self-pay | Admitting: Licensed Clinical Social Worker

## 2020-08-24 DIAGNOSIS — F4310 Post-traumatic stress disorder, unspecified: Secondary | ICD-10-CM

## 2020-08-25 NOTE — Progress Notes (Signed)
Virtual Visit via Telephone Note  I connected with Vincent Davis on 08/25/20 at 10:00 AM EST by telephone and verified that I am speaking with the correct person using two identifiers.  Location: Patient: Home  Provider: Office    I discussed the limitations, risks, security and privacy concerns of performing an evaluation and management service by telephone and the availability of in person appointments. I also discussed with the patient that there may be a patient responsible charge related to this service. The patient expressed understanding and agreed to proceed.   THERAPIST PROGRESS NOTE  Session Time: 10:00 am-10:45 am  Participation Level: Active  Behavioral Response: CasualAlertAnxious  Type of Therapy: Individual Therapy  Treatment Goals addressed: Coping  Interventions: CBT and Solution Focused  Case Summary: Vincent Davis is a 44 y.o. male who presents oriented x4 (person, place, situation, time and object), casually dressed, appropriately groomed, average height, average weight, and cooperative to address mood and anxiety. Patient has has a history of medical treatment including neck and arm pain, and blood clot. Patient has a history of mental health treatment including outpatient therapy and medication management. Patient denies suicidal and homicidal ideations. Patient admits to psychosis including hearing whispers and seeing shadow figures. Patient denies substance abuse. Patient is at low risk for lethality.   Session#11  Physically: Patient continues to struggle with his physical pain. He has not been able to get physical therapy.  Spiritually/values: No issues identified.  Relationships: Patient's partner has asked him to leave. He was confused by this because this all started because he didn't answer his phone while he was watching a football game but did respond through text. He has contacted his family about trying to find a place to stay. He has  also considered calling his partner and apologizing/working things out.  Emotionally/Mentally/Behavior: Patient's mood has been down and stressed. He is worried about finding a place to live and it is too cold to not have shelter.     Patient engaged in session. He responded well to interventions. Patient continues to meet criteria for PTSD. Patient will continue in outpatient therapy due to being the least restrictive service to meet his needs at this time. Patient made minimal progress on his goals at this time.   Suicidal/Homicidal: Negativewithout intent/plan  Therapist Response: Therapist reviewed patient's recent thoughts and behaviors. Therapist utilized CBT to address mood and anxiety. Therapist discussed with patient his relationship and finding a place to stay.   Plan: Return again in 2 weeks.  Diagnosis: Axis I: Post Traumatic Stress Disorder    Axis II: No diagnosis  I discussed the assessment and treatment plan with the patient. The patient was provided an opportunity to ask questions and all were answered. The patient agreed with the plan and demonstrated an understanding of the instructions.   The patient was advised to call back or seek an in-person evaluation if the symptoms worsen or if the condition fails to improve as anticipated.  I provided 45 minutes of non-face-to-face time during this encounter.  Bynum Bellows, LCSW 08/25/2020

## 2020-09-01 ENCOUNTER — Encounter: Payer: Self-pay | Admitting: Family Medicine

## 2020-09-01 ENCOUNTER — Ambulatory Visit (INDEPENDENT_AMBULATORY_CARE_PROVIDER_SITE_OTHER): Payer: Self-pay | Admitting: Family Medicine

## 2020-09-01 ENCOUNTER — Other Ambulatory Visit: Payer: Self-pay

## 2020-09-01 VITALS — BP 132/67 | HR 69 | Temp 98.1°F | Ht 71.0 in | Wt 233.2 lb

## 2020-09-01 DIAGNOSIS — R200 Anesthesia of skin: Secondary | ICD-10-CM

## 2020-09-01 DIAGNOSIS — G4700 Insomnia, unspecified: Secondary | ICD-10-CM

## 2020-09-01 DIAGNOSIS — R0602 Shortness of breath: Secondary | ICD-10-CM

## 2020-09-01 DIAGNOSIS — M79602 Pain in left arm: Secondary | ICD-10-CM

## 2020-09-01 DIAGNOSIS — I10 Essential (primary) hypertension: Secondary | ICD-10-CM

## 2020-09-01 DIAGNOSIS — M549 Dorsalgia, unspecified: Secondary | ICD-10-CM

## 2020-09-01 DIAGNOSIS — R202 Paresthesia of skin: Secondary | ICD-10-CM

## 2020-09-01 DIAGNOSIS — F419 Anxiety disorder, unspecified: Secondary | ICD-10-CM

## 2020-09-01 DIAGNOSIS — G8929 Other chronic pain: Secondary | ICD-10-CM

## 2020-09-01 DIAGNOSIS — M545 Low back pain, unspecified: Secondary | ICD-10-CM

## 2020-09-01 DIAGNOSIS — F4310 Post-traumatic stress disorder, unspecified: Secondary | ICD-10-CM

## 2020-09-01 DIAGNOSIS — Z09 Encounter for follow-up examination after completed treatment for conditions other than malignant neoplasm: Secondary | ICD-10-CM

## 2020-09-01 MED ORDER — ALBUTEROL SULFATE HFA 108 (90 BASE) MCG/ACT IN AERS: 2.0000 | INHALATION_SPRAY | Freq: Four times a day (QID) | RESPIRATORY_TRACT | 11 refills | Status: AC | PRN

## 2020-09-01 MED ORDER — ESCITALOPRAM OXALATE 20 MG PO TABS: 40.0000 mg | ORAL_TABLET | Freq: Every day | ORAL | 3 refills | Status: AC

## 2020-09-01 MED ORDER — BUSPIRONE HCL 7.5 MG PO TABS: 7.5000 mg | ORAL_TABLET | Freq: Two times a day (BID) | ORAL | 3 refills | Status: AC

## 2020-09-01 MED ORDER — MELOXICAM 15 MG PO TABS: 15.0000 mg | ORAL_TABLET | Freq: Every day | ORAL | 3 refills | Status: AC

## 2020-09-01 MED ORDER — CYCLOBENZAPRINE HCL 10 MG PO TABS: 10.0000 mg | ORAL_TABLET | Freq: Three times a day (TID) | ORAL | 6 refills | Status: AC | PRN

## 2020-09-01 MED ORDER — LAMOTRIGINE 25 MG PO TABS: 75.0000 mg | ORAL_TABLET | Freq: Every day | ORAL | 3 refills | Status: AC

## 2020-09-01 MED ORDER — TRAZODONE HCL 100 MG PO TABS: 100.0000 mg | ORAL_TABLET | Freq: Every evening | ORAL | 3 refills | Status: AC | PRN

## 2020-09-01 MED FILL — ALBUTEROL SULFATE HFA 108 (: 108 (90 BAS | 18 days supply | Qty: 18 | Fill #0

## 2020-09-01 MED FILL — lamoTRIgine 25 MG TABS: 25 | 30 days supply | Qty: 90 | Fill #0

## 2020-09-01 MED FILL — ESCITALOPRAM 20 MG TABLET: 20 | 30 days supply | Qty: 60 | Fill #0

## 2020-09-01 MED FILL — busPIRone HCL 7.5 MG TABS: 7.5 | 30 days supply | Qty: 60 | Fill #0

## 2020-09-01 MED FILL — TRAZODONE HCL 100 MG TABS: 100 | 30 days supply | Qty: 30 | Fill #0

## 2020-09-01 MED FILL — MELOXICAM 15 MG TABLET: 15 | 30 days supply | Qty: 30 | Fill #0

## 2020-09-01 MED FILL — CYCLOBENZAPRINE 10 MG TAB: 10 | 30 days supply | Qty: 90 | Fill #0

## 2020-09-01 NOTE — Patient Instructions (Signed)
Muscle Cramps and Spasms Muscle cramps and spasms are when muscles tighten by themselves. They usually get better within minutes. Muscle cramps are painful. They are usually stronger and last longer than muscle spasms. Muscle spasms may or may not be painful. They can last a few seconds or much longer. Cramps and spasms can affect any muscle, but they occur most often in the calf muscles of the leg. They are usually not caused by a serious problem. In many cases, the cause is not known. Some common causes include:  Doing more physical work or exercise than your body is ready for.  Using the muscles too much (overuse) by repeating certain movements too many times.  Staying in a certain position for a long time.  Playing a sport or doing an activity without preparing properly.  Using bad form or technique while playing a sport or doing an activity.  Not having enough water in your body (dehydration).  Injury.  Side effects of some medicines.  Low levels of the salts and minerals in your blood (electrolytes), such as low potassium or calcium. Follow these instructions at home: Managing pain and stiffness      Massage, stretch, and relax the muscle. Do this for many minutes at a time.  If told, put heat on tight or tense muscles as often as told by your doctor. Use the heat source that your doctor recommends, such as a moist heat pack or a heating pad. ? Place a towel between your skin and the heat source. ? Leave the heat on for 20-30 minutes. ? Remove the heat if your skin turns bright red. This is very important if you are not able to feel pain, heat, or cold. You may have a greater risk of getting burned.  If told, put ice on the affected area. This may help if you are sore or have pain after a cramp or spasm. ? Put ice in a plastic bag. ? Place a towel between your skin and the bag. ? Leave the ice on for 20 minutes, 2-3 times a day.  Try taking hot showers or baths to help  relax tight muscles. Eating and drinking  Drink enough fluid to keep your pee (urine) pale yellow.  Eat a healthy diet to help ensure that your muscles work well. This should include: ? Fruits and vegetables. ? Lean protein. ? Whole grains. ? Low-fat or nonfat dairy products. General instructions  If you are having cramps often, avoid intense exercise for several days.  Take over-the-counter and prescription medicines only as told by your doctor.  Watch for any changes in your symptoms.  Keep all follow-up visits as told by your doctor. This is important. Contact a doctor if:  Your cramps or spasms get worse or happen more often.  Your cramps or spasms do not get better with time. Summary  Muscle cramps and spasms are when muscles tighten by themselves. They usually get better within minutes.  Cramps and spasms occur most often in the calf muscles of the leg.  Massage, stretch, and relax the muscle. This may help the cramp or spasm go away.  Drink enough fluid to keep your pee (urine) pale yellow. This information is not intended to replace advice given to you by your health care provider. Make sure you discuss any questions you have with your health care provider. Document Revised: 02/11/2018 Document Reviewed: 02/11/2018 Elsevier Patient Education  2020 [employer] Cyclobenzaprine tablets What is this medicine? CYCLOBENZAPRINE (sye kloe   BEN za preen) is a muscle relaxer. It is used to treat muscle pain, spasms, and stiffness. This medicine may be used for other purposes; ask your health care provider or pharmacist if you have questions. COMMON BRAND NAME(S): Fexmid, Flexeril What should I tell my health care provider before I take this medicine? They need to know if you have any of these conditions:  heart disease, irregular heartbeat, or previous heart attack  liver disease  thyroid problem  an unusual or allergic reaction to cyclobenzaprine, tricyclic  antidepressants, lactose, other medicines, foods, dyes, or preservatives  pregnant or trying to get pregnant  breast-feeding How should I use this medicine? Take this medicine by mouth with a glass of water. Follow the directions on the prescription label. If this medicine upsets your stomach, take it with food or milk. Take your medicine at regular intervals. Do not take it more often than directed. Talk to your pediatrician regarding the use of this medicine in children. Special care may be needed. Overdosage: If you think you have taken too much of this medicine contact a poison control center or emergency room at once. NOTE: This medicine is only for you. Do not share this medicine with others. What if I miss a dose? If you miss a dose, take it as soon as you can. If it is almost time for your next dose, take only that dose. Do not take double or extra doses. What may interact with this medicine? Do not take this medicine with any of the following medications:  MAOIs like Carbex, Eldepryl, Marplan, Nardil, and Parnate  narcotic medicines for cough  safinamide This medicine may also interact with the following medications:  alcohol  bupropion  antihistamines for allergy, cough and cold  certain medicines for anxiety or sleep  certain medicines for bladder problems like oxybutynin, tolterodine  certain medicines for depression like amitriptyline, fluoxetine, sertraline  certain medicines for Parkinson's disease like benztropine, trihexyphenidyl  certain medicines for seizures like phenobarbital, primidone  certain medicines for stomach problems like dicyclomine, hyoscyamine  certain medicines for travel sickness like scopolamine  general anesthetics like halothane, isoflurane, methoxyflurane, propofol  ipratropium  local anesthetics like lidocaine, pramoxine, tetracaine  medicines that relax muscles for surgery  narcotic medicines for pain  phenothiazines like  chlorpromazine, mesoridazine, prochlorperazine, thioridazine  verapamil This list may not describe all possible interactions. Give your health care provider a list of all the medicines, herbs, non-prescription drugs, or dietary supplements you use. Also tell them if you smoke, drink alcohol, or use illegal drugs. Some items may interact with your medicine. What should I watch for while using this medicine? Tell your doctor or health care professional if your symptoms do not start to get better or if they get worse. You may get drowsy or dizzy. Do not drive, use machinery, or do anything that needs mental alertness until you know how this medicine affects you. Do not stand or sit up quickly, especially if you are an older patient. This reduces the risk of dizzy or fainting spells. Alcohol may interfere with the effect of this medicine. Avoid alcoholic drinks. If you are taking another medicine that also causes drowsiness, you may have more side effects. Give your health care provider a list of all medicines you use. Your doctor will tell you how much medicine to take. Do not take more medicine than directed. Call emergency for help if you have problems breathing or unusual sleepiness. Your mouth may get dry. Chewing sugarless gum   or sucking hard candy, and drinking plenty of water may help. Contact your doctor if the problem does not go away or is severe. What side effects may I notice from receiving this medicine? Side effects that you should report to your doctor or health care professional as soon as possible:  allergic reactions like skin rash, itching or hives, swelling of the face, lips, or tongue  breathing problems  chest pain  fast, irregular heartbeat  hallucinations  seizures  unusually weak or tired Side effects that usually do not require medical attention (report to your doctor or health care professional if they continue or are bothersome):  headache  nausea, vomiting This  list may not describe all possible side effects. Call your doctor for medical advice about side effects. You may report side effects to FDA at 1-800-FDA-1088. Where should I keep my medicine? Keep out of the reach of children. Store at room temperature between 15 and 30 degrees C (59 and 86 degrees F). Keep container tightly closed. Throw away any unused medicine after the expiration date. NOTE: This sheet is a summary. It may not cover all possible information. If you have questions about this medicine, talk to your doctor, pharmacist, or health care provider.  2020 Elsevier/Gold Standard (2018-08-21 12:49:26)  

## 2020-09-01 NOTE — Progress Notes (Signed)
[facility]    Established Patient Office Visit  Subjective:  Patient ID: Vincent Davis, male    DOB: [birth year]  Age: 44 y.o. MRN: [id number]  CC:  Chief Complaint  Patient presents with  . Follow-up    HPI Vincent Davis is a 44 year old male who presents for Follow Up today.   Patient Active Problem List   Diagnosis Date Noted  . Diverticulitis 04/20/2020  . Lower abdominal pain 04/20/2020  . Chronic back pain 04/20/2020  . Chronic pain of both lower extremities 02/19/2020  . Essential hypertension 04/27/2019  . Numbness and tingling 04/27/2019  . Anxiety 04/27/2019  . PTSD (post-traumatic stress disorder) 11/04/2018  . Oropharyngeal dysphagia   . Acute renal failure (HCC) 09/07/2017  . Pulmonary edema, acute (HCC)   . Acute hypercapnic respiratory failure (HCC)   . Cardiac arrest due to respiratory disorder (HCC)   . 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 Alcohol use 05/10/2017    Current Status: Since his last office visit, he is doing well with no complaints. He denies visual changes, chest pain, cough, shortness of breath, heart palpitations, and falls. He has occasional headaches and dizziness with position changes. Denies severe headaches, confusion, seizures, double vision, and blurred vision, nausea and vomiting. He continues to follow up with Psychiatry 1-2 monthly. He denies suicidal ideations, homicidal ideations, or auditory hallucinations. He denies fevers, chills, fatigue, recent infections, weight loss, and night sweats. Denies GI problems such as diarrhea, and constipation. He has no reports of blood in stools, dysuria and hematuria. He is taking all medications as prescribed.  Past Medical History:  Diagnosis Date  . Anxiety   . 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 Cervical spondylosis with radiculopathy   . Complication of anesthesia    no  previous surgery  . Diverticulitis 04/2020  . Family history of adverse reaction to anesthesia    "I don't know."  . H/O cervical discectomy   . Left lower quadrant abdominal pain 04/2020  . Obesity (BMI 30.0-34.9)   . Pain and numbness of left upper extremity   . Pneumonia    as a child  . PTSD (post-traumatic stress disorder)   . Vitamin D deficiency 10/2019    Past Surgical History:  Procedure Laterality Date  . ANTERIOR CERVICAL DECOMP/DISCECTOMY FUSION N/A 09/03/2017   Procedure: CERVICAL SIX-SEVEN  ANTERIOR CERVICAL DISCECTOMY AND FUSION, ALLOGRAFT, PLATE;  Surgeon: Eldred Manges, MD;  Location: [facility] OR;  Service: Orthopedics;  Laterality: N/A;  . EVACUATION OF CERVICAL HEMATOMA N/A 09/07/2017   Procedure: EVACUATION OF NECK HEMATOMA;  Surgeon: Eldred Manges, MD;  Location: [facility] OR;  Service: Orthopedics;  Laterality: N/A;  . NO PAST SURGERIES      Family History  Problem Relation Age of Onset  . Other Mother        Healthy    Social History   Socioeconomic History  . Marital status: Single    Spouse name: Not on file  . Number of children: 2  . Years of education: some college  . Highest education level: Not on file  Occupational History    Comment: NA  Tobacco Use  . Smoking status: Former Smoker    Types: Cigarettes  . Smokeless tobacco: Never Used  Vaping Use  . Vaping Use: Never used  Substance and Sexual Activity  . Alcohol use: Yes    Comment: occasional  .  Drug use: Not Currently    Types: Marijuana    Comment: last use 08/29/17  . Sexual activity: Not Currently  Other Topics Concern  . Not on file  Social History Narrative   Lives with sig other   Caffeine- not daily   Social Determinants of Health   Financial Resource Strain:   . Difficulty of Paying Living Expenses: Not on file  Food Insecurity:   . Worried About Programme researcher, broadcasting/film/video in the Last Year: Not on file  . Ran Out of Food in the Last Year: Not on file  Transportation Needs:   .  Lack of Transportation (Medical): Not on file  . Lack of Transportation (Non-Medical): Not on file  Physical Activity:   . Days of Exercise per Week: Not on file  . Minutes of Exercise per Session: Not on file  Stress:   . Feeling of Stress : Not on file  Social Connections:   . Frequency of Communication with Friends and Family: Not on file  . Frequency of Social Gatherings with Friends and Family: Not on file  . Attends Religious Services: Not on file  . Active Member of Clubs or Organizations: Not on file  . Attends Banker Meetings: Not on file  . Marital Status: Not on file  Intimate Partner Violence:   . Fear of Current or Ex-Partner: Not on file  . Emotionally Abused: Not on file  . Physically Abused: Not on file  . Sexually Abused: Not on file    Outpatient Medications Prior to Visit  Medication Sig Dispense Refill  . aspirin EC 81 MG tablet Take 81 mg by mouth daily.     Marland Kitchen levocetirizine (XYZAL) 5 MG tablet Take 5 mg by mouth daily as needed for allergies.     . valACYclovir (VALTREX) 1000 MG tablet Take 1 tablet (1,000 mg total) by mouth 2 (two) times daily. As needed. 20 tablet 2  . albuterol (VENTOLIN HFA) 108 (90 Base) MCG/ACT inhaler INHALE 2 PUFFS INTO THE LUNGS EVERY 6 (SIX) HOURS AS NEEDED FOR WHEEZING OR SHORTNESS OF BREATH. 18 g 2  . busPIRone (BUSPAR) 7.5 MG tablet Take 1 tablet (7.5 mg total) by mouth 2 (two) times daily. 60 tablet 1  . escitalopram (LEXAPRO) 20 MG tablet Take 2 tablets (40 mg total) by mouth daily. 60 tablet 1  . meloxicam (MOBIC) 15 MG tablet Take 1 tablet (15 mg total) by mouth daily. 30 tablet 3  . traZODone (DESYREL) 100 MG tablet Take 1 tablet (100 mg total) by mouth at bedtime as needed for sleep. 30 tablet 2  . Vitamin D, Ergocalciferol, (DRISDOL) 1.25 MG (50000 UNIT) CAPS capsule Take 1 capsule (50,000 Units total) by mouth every 7 (seven) days. (Patient not taking: Reported on 09/01/2020) 5 capsule 6  . ciprofloxacin (CIPRO)  500 MG tablet Take 1 tablet (500 mg total) by mouth 2 (two) times daily. (Patient not taking: Reported on 09/01/2020) 14 tablet 0  . lamoTRIgine (LAMICTAL) 25 MG tablet Take 3 tablets (75 mg total) by mouth daily. (Patient not taking: Reported on 09/01/2020) 90 tablet 0  . oxyCODONE-acetaminophen (PERCOCET/ROXICET) 5-325 MG tablet Take 1 tablet by mouth every 8 (eight) hours as needed for severe pain. (Patient not taking: Reported on 09/01/2020) 30 tablet 0   No facility-administered medications prior to visit.    No Known Allergies  ROS Review of Systems  Constitutional: Negative.   HENT: Negative.   Eyes: Negative.   Respiratory: Negative.  Cardiovascular: Negative.   Gastrointestinal: Positive for abdominal distention (obese).  Endocrine: Negative.   Genitourinary: Negative.   Musculoskeletal: Positive for arthralgias (generalized).  Skin: Negative.   Allergic/Immunologic: Negative.   Neurological: Positive for dizziness (occasional ) and headaches (occasional ).  Hematological: Negative.   Psychiatric/Behavioral: Negative.       Objective:    Physical Exam Vitals and nursing note reviewed.  Constitutional:      Appearance: Normal appearance.  HENT:     Head: Normocephalic and atraumatic.     Nose: Nose normal.     Mouth/Throat:     Mouth: Mucous membranes are moist.     Pharynx: Oropharynx is clear.  Cardiovascular:     Rate and Rhythm: Normal rate and regular rhythm.     Pulses: Normal pulses.     Heart sounds: Normal heart sounds.  Pulmonary:     Effort: Pulmonary effort is normal.     Breath sounds: Normal breath sounds.  Abdominal:     General: Bowel sounds are normal.     Palpations: Abdomen is soft.  Musculoskeletal:     Cervical back: Normal range of motion and neck supple.  Skin:    General: Skin is warm and dry.  Neurological:     General: No focal deficit present.     Mental Status: He is alert and oriented to person, place, and time.     Sensory:  Sensory deficit (occasional numbness left hand) present.     Motor: Weakness (left sided upper extremity) present.  Psychiatric:        Mood and Affect: Mood normal.        Behavior: Behavior normal.        Thought Content: Thought content normal.        Judgment: Judgment normal.    BP 132/67 (BP Location: Right Arm, Patient Position: Sitting, Cuff Size: Large)   Pulse 69   Temp 98.1 F (36.7 C)   Ht 5\' 11"  (1.803 m)   Wt 233 lb 3.2 oz (105.8 kg)   SpO2 98%   BMI 32.52 kg/m  Wt Readings from Last 3 Encounters:  09/01/20 233 lb 3.2 oz (105.8 kg)  04/16/20 233 lb (105.7 kg)  04/04/20 235 lb (106.6 kg)    Health Maintenance Due  Topic Date Due  . COVID-19 Vaccine (2 - Pfizer 2-dose series) 04/27/2020    There are no preventive care reminders to display for this patient.  Lab Results  Component Value Date   TSH 0.744 09/01/2020   Lab Results  Component Value Date   WBC 11.8 (H) 09/01/2020   HGB 14.9 09/01/2020   HCT 45.1 09/01/2020   MCV 90 09/01/2020   PLT 334 09/01/2020   Lab Results  Component Value Date   NA 143 09/01/2020   K 4.2 09/01/2020   CO2 21 09/01/2020   GLUCOSE 116 (H) 09/01/2020   BUN 8 09/01/2020   CREATININE 1.03 09/01/2020   BILITOT 0.4 09/01/2020   ALKPHOS 53 09/01/2020   AST 15 09/01/2020   ALT 13 09/01/2020   PROT 7.6 09/01/2020   ALBUMIN 4.8 09/01/2020   CALCIUM 9.4 09/01/2020   ANIONGAP 11 04/04/2020   Lab Results  Component Value Date   CHOL 142 10/15/2019   Lab Results  Component Value Date   HDL 64 10/15/2019   Lab Results  Component Value Date   LDLCALC 54 10/15/2019   Lab Results  Component Value Date   TRIG 142 10/15/2019   Lab  Results  Component Value Date   CHOLHDL 2.2 10/15/2019   Lab Results  Component Value Date   HGBA1C 5.3 10/25/2018    Assessment & Plan:   1. Hospital discharge follow-up  2. Essential hypertension The current medical regimen is effective; blood pressure is stable at 132/67  today; continue present plan and medications as prescribed. He will continue to take medications as prescribed, to decrease high sodium intake, excessive alcohol intake, increase potassium intake, smoking cessation, and increase physical activity of at least 30 minutes of cardio activity daily. He will continue to follow Heart Healthy or DASH diet.  3. Anxiety  4. Chronic back pain, unspecified back location, unspecified back pain laterality  5. Numbness and tingling  6. PTSD (post-traumatic stress disorder)  7. Follow up He will follow up in 6 months.    Meds ordered this encounter  Medications  . cyclobenzaprine (FLEXERIL) 10 MG tablet    Sig: Take 1 tablet (10 mg total) by mouth 3 (three) times daily as needed for muscle spasms.    Dispense:  90 tablet    Refill:  6  . albuterol (VENTOLIN HFA) 108 (90 Base) MCG/ACT inhaler    Sig: Inhale 2 puffs into the lungs every 6 (six) hours as needed for wheezing or shortness of breath.    Dispense:  18 g    Refill:  11  . busPIRone (BUSPAR) 7.5 MG tablet    Sig: Take 1 tablet (7.5 mg total) by mouth 2 (two) times daily.    Dispense:  180 tablet    Refill:  3  . escitalopram (LEXAPRO) 20 MG tablet    Sig: Take 2 tablets (40 mg total) by mouth daily.    Dispense:  180 tablet    Refill:  3  . meloxicam (MOBIC) 15 MG tablet    Sig: Take 1 tablet (15 mg total) by mouth daily.    Dispense:  90 tablet    Refill:  3  . traZODone (DESYREL) 100 MG tablet    Sig: Take 1 tablet (100 mg total) by mouth at bedtime as needed for sleep.    Dispense:  90 tablet    Refill:  3  . lamoTRIgine (LAMICTAL) 25 MG tablet    Sig: Take 3 tablets (75 mg total) by mouth daily.    Dispense:  270 tablet    Refill:  3    Orders Placed This Encounter  Procedures  . CBC with Differential  . Comprehensive metabolic panel  . TSH  . Vitamin B12  . Vitamin D, 25-hydroxy    Raliegh Ip, MSN, ANE, FNP-BC [facility] [address] [place], [place] [address] [phone] fax   Problem List Items Addressed This Visit      Cardiovascular and Mediastinum   Essential hypertension   Relevant Orders   CBC with Differential (Completed)   Comprehensive metabolic panel (Completed)   TSH (Completed)   Vitamin B12 (Completed)   Vitamin D, 25-hydroxy (Completed)     Other   Anxiety   Relevant Medications   busPIRone (BUSPAR) 7.5 MG tablet   escitalopram (LEXAPRO) 20 MG tablet   traZODone (DESYREL) 100 MG tablet   lamoTRIgine (LAMICTAL) 25 MG tablet   Chronic back pain   Relevant Medications   cyclobenzaprine (FLEXERIL) 10 MG tablet   escitalopram (LEXAPRO) 20 MG tablet   meloxicam (MOBIC) 15 MG tablet   traZODone (DESYREL) 100 MG tablet  lamoTRIgine (LAMICTAL) 25 MG tablet   Numbness and tingling   PTSD (post-traumatic stress disorder)   Relevant Medications   busPIRone (BUSPAR) 7.5 MG tablet   escitalopram (LEXAPRO) 20 MG tablet   traZODone (DESYREL) 100 MG tablet   lamoTRIgine (LAMICTAL) 25 MG tablet    Other Visit Diagnoses    Hospital discharge follow-up    -  Primary   Shortness of breath       Relevant Medications   albuterol (VENTOLIN HFA) 108 (90 Base) MCG/ACT inhaler   Left arm pain       Relevant Medications   meloxicam (MOBIC) 15 MG tablet   Acute bilateral low back pain without sciatica       Relevant Medications   cyclobenzaprine (FLEXERIL) 10 MG tablet   meloxicam (MOBIC) 15 MG tablet   Insomnia, unspecified type       Relevant Medications   traZODone (DESYREL) 100 MG tablet   Follow up          Meds ordered this encounter  Medications  . cyclobenzaprine (FLEXERIL) 10 MG tablet    Sig: Take 1 tablet (10 mg total) by mouth 3 (three) times daily as needed for muscle spasms.    Dispense:  90 tablet    Refill:  6  . albuterol (VENTOLIN HFA) 108 (90 Base) MCG/ACT inhaler    Sig: Inhale 2 puffs  into the lungs every 6 (six) hours as needed for wheezing or shortness of breath.    Dispense:  18 g    Refill:  11  . busPIRone (BUSPAR) 7.5 MG tablet    Sig: Take 1 tablet (7.5 mg total) by mouth 2 (two) times daily.    Dispense:  180 tablet    Refill:  3  . escitalopram (LEXAPRO) 20 MG tablet    Sig: Take 2 tablets (40 mg total) by mouth daily.    Dispense:  180 tablet    Refill:  3  . meloxicam (MOBIC) 15 MG tablet    Sig: Take 1 tablet (15 mg total) by mouth daily.    Dispense:  90 tablet    Refill:  3  . traZODone (DESYREL) 100 MG tablet    Sig: Take 1 tablet (100 mg total) by mouth at bedtime as needed for sleep.    Dispense:  90 tablet    Refill:  3  . lamoTRIgine (LAMICTAL) 25 MG tablet    Sig: Take 3 tablets (75 mg total) by mouth daily.    Dispense:  270 tablet    Refill:  3    Follow-up: No follow-ups on file.    Kallie LocksNatalie M Makala Fetterolf, FNP

## 2020-09-02 LAB — CBC WITH DIFFERENTIAL/PLATELET
Basophils Absolute: 0.1 10*3/uL (ref 0.0–0.2)
Basos: 1 %
EOS (ABSOLUTE): 0.4 10*3/uL (ref 0.0–0.4)
Eos: 3 %
Hematocrit: 45.1 % (ref 37.5–51.0)
Hemoglobin: 14.9 g/dL (ref 13.0–17.7)
Immature Grans (Abs): 0.1 10*3/uL (ref 0.0–0.1)
Immature Granulocytes: 1 %
Lymphocytes Absolute: 1.8 10*3/uL (ref 0.7–3.1)
Lymphs: 15 %
MCH: 29.6 pg (ref 26.6–33.0)
MCHC: 33 g/dL (ref 31.5–35.7)
MCV: 90 fL (ref 79–97)
Monocytes Absolute: 0.8 10*3/uL (ref 0.1–0.9)
Monocytes: 7 %
Neutrophils Absolute: 8.7 10*3/uL — ABNORMAL HIGH (ref 1.4–7.0)
Neutrophils: 73 %
Platelets: 334 10*3/uL (ref 150–450)
RBC: 5.04 x10E6/uL (ref 4.14–5.80)
RDW: 11.4 % — ABNORMAL LOW (ref 11.6–15.4)
WBC: 11.8 10*3/uL — ABNORMAL HIGH (ref 3.4–10.8)

## 2020-09-02 LAB — COMPREHENSIVE METABOLIC PANEL
ALT: 13 IU/L (ref 0–44)
AST: 15 IU/L (ref 0–40)
Albumin/Globulin Ratio: 1.7 (ref 1.2–2.2)
Albumin: 4.8 g/dL (ref 4.0–5.0)
Alkaline Phosphatase: 53 IU/L (ref 44–121)
BUN/Creatinine Ratio: 8 — ABNORMAL LOW (ref 9–20)
BUN: 8 mg/dL (ref 6–24)
Bilirubin Total: 0.4 mg/dL (ref 0.0–1.2)
CO2: 21 mmol/L (ref 20–29)
Calcium: 9.4 mg/dL (ref 8.7–10.2)
Chloride: 101 mmol/L (ref 96–106)
Creatinine, Ser: 1.03 mg/dL (ref 0.76–1.27)
GFR calc Af Amer: 102 mL/min/{1.73_m2} (ref >59)
GFR calc non Af Amer: 88 mL/min/{1.73_m2} (ref >59)
Globulin, Total: 2.8 g/dL (ref 1.5–4.5)
Glucose: 116 mg/dL — ABNORMAL HIGH (ref 65–99)
Potassium: 4.2 mmol/L (ref 3.5–5.2)
Sodium: 143 mmol/L (ref 134–144)
Total Protein: 7.6 g/dL (ref 6.0–8.5)

## 2020-09-02 LAB — TSH: TSH: 0.744 u[IU]/mL (ref 0.450–4.500)

## 2020-09-02 LAB — VITAMIN B12: Vitamin B-12: 354 pg/mL (ref 232–1245)

## 2020-09-02 LAB — VITAMIN D 25 HYDROXY (VIT D DEFICIENCY, FRACTURES): Vit D, 25-Hydroxy: 11 ng/mL — ABNORMAL LOW (ref 30.0–100.0)

## 2020-09-03 ENCOUNTER — Encounter: Payer: Self-pay | Admitting: Family Medicine

## 2020-09-07 ENCOUNTER — Ambulatory Visit (INDEPENDENT_AMBULATORY_CARE_PROVIDER_SITE_OTHER): Payer: Self-pay | Admitting: Licensed Clinical Social Worker

## 2020-09-07 ENCOUNTER — Other Ambulatory Visit: Payer: Self-pay

## 2020-09-07 DIAGNOSIS — F4310 Post-traumatic stress disorder, unspecified: Secondary | ICD-10-CM

## 2020-09-07 NOTE — Progress Notes (Signed)
Virtual Visit via Telephone Note  I connected with Vincent Davis on 09/07/20 at  9:00 AM EST by telephone and verified that I am speaking with the correct person using two identifiers.  Location: Patient: Home  Provider: Office    I discussed the limitations, risks, security and privacy concerns of performing an evaluation and management service by telephone and the availability of in person appointments. I also discussed with the patient that there may be a patient responsible charge related to this service. The patient expressed understanding and agreed to proceed.   THERAPIST PROGRESS NOTE  Session Time: 9:00 am-9:15 am  Participation Level: Active  Behavioral Response: CasualAlertAnxious  Type of Therapy: Individual Therapy  Treatment Goals addressed: Coping  Interventions: CBT and Solution Focused  Case Summary: Vincent Davis is a 44 y.o. male who presents oriented x4 (person, place, situation, time and object), casually dressed, appropriately groomed, average height, average weight, and cooperative to address mood and anxiety. Patient has has a history of medical treatment including neck and arm pain, and blood clot. Patient has a history of mental health treatment including outpatient therapy and medication management. Patient denies suicidal and homicidal ideations. Patient admits to psychosis including hearing whispers and seeing shadow figures. Patient denies substance abuse. Patient is at low risk for lethality.   Session#12  Physically: Patient was exhausted and had just woke up. Patient needed to go back to sleep.   Spiritually/values: No issues identified.  Relationships: Patient is still at home. The situation has not changed but he has a place to stay.  Emotionally/Mentally/Behavior: Patient's mood is still down and irritable. He worries about how things will be in the future.     Patient engaged in session. He responded well to interventions. Patient  continues to meet criteria for PTSD. Patient will continue in outpatient therapy due to being the least restrictive service to meet his needs at this time. Patient made minimal progress on his goals at this time.   Suicidal/Homicidal: Negativewithout intent/plan  Therapist Response: Therapist reviewed patient's recent thoughts and behaviors. Therapist utilized CBT to address mood and anxiety. Therapist discussed with patient's home life and his sleep.   Plan: Return again in 2 weeks.  Diagnosis: Axis I: Post Traumatic Stress Disorder    Axis II: No diagnosis  I discussed the assessment and treatment plan with the patient. The patient was provided an opportunity to ask questions and all were answered. The patient agreed with the plan and demonstrated an understanding of the instructions.   The patient was advised to call back or seek an in-person evaluation if the symptoms worsen or if the condition fails to improve as anticipated.  I provided 15 minutes of non-face-to-face time during this encounter.  Bynum Bellows, LCSW 09/07/2020

## 2020-09-21 ENCOUNTER — Ambulatory Visit: Payer: Self-pay | Admitting: Family Medicine

## 2020-09-22 ENCOUNTER — Ambulatory Visit (HOSPITAL_COMMUNITY): Payer: Self-pay | Admitting: Licensed Clinical Social Worker

## 2020-10-13 ENCOUNTER — Ambulatory Visit (INDEPENDENT_AMBULATORY_CARE_PROVIDER_SITE_OTHER): Payer: Self-pay | Admitting: Licensed Clinical Social Worker

## 2020-10-13 DIAGNOSIS — F4310 Post-traumatic stress disorder, unspecified: Secondary | ICD-10-CM
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
## 2020-10-15 ENCOUNTER — Telehealth: Payer: Self-pay | Admitting: Family Medicine

## 2020-10-15 ENCOUNTER — Telehealth (INDEPENDENT_AMBULATORY_CARE_PROVIDER_SITE_OTHER): Payer: Self-pay | Admitting: Family Medicine

## 2020-10-15 NOTE — Telephone Encounter (Signed)
Continue to attempt to reach Mr. Vincent Davis for his Telephone Virtual Visit today. Unable to reach. CMA to contact him for assessment.

## 2020-10-15 NOTE — Telephone Encounter (Signed)
Attempted to contact patient for scheduled Telephone Visit today. Message left on patient's voicemail to return call to office.

## 2020-10-17 NOTE — Telephone Encounter (Signed)
Unable to contact patient for Telephone Visit today.

## 2020-10-19 ENCOUNTER — Ambulatory Visit: Payer: Self-pay | Admitting: Family Medicine

## 2020-10-27 ENCOUNTER — Ambulatory Visit (INDEPENDENT_AMBULATORY_CARE_PROVIDER_SITE_OTHER): Payer: Self-pay | Admitting: Licensed Clinical Social Worker

## 2020-10-27 DIAGNOSIS — F4310 Post-traumatic stress disorder, unspecified: Secondary | ICD-10-CM
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
## 2020-11-23 ENCOUNTER — Ambulatory Visit (INDEPENDENT_AMBULATORY_CARE_PROVIDER_SITE_OTHER): Payer: Self-pay | Admitting: Licensed Clinical Social Worker

## 2020-11-23 DIAGNOSIS — F4310 Post-traumatic stress disorder, unspecified: Secondary | ICD-10-CM

## 2020-11-24 NOTE — Progress Notes (Signed)
Virtual Visit via Telephone Note  I connected with Vincent Davis on 11/24/20 at 11:00 AM EST by telephone and verified that I am speaking with the correct person using two identifiers.  Location: Patient: Home  Provider: Office    I discussed the limitations, risks, security and privacy concerns of performing an evaluation and management service by telephone and the availability of in person appointments. I also discussed with the patient that there may be a patient responsible charge related to this service. The patient expressed understanding and agreed to proceed.   THERAPIST PROGRESS NOTE  Session Time: 11:00 am-11:16 am  Type of Therapy: Individual Therapy  Session#15  Purpose of Session/Treatment Goals addressed: "Wisam will manage mood as evidenced by improving sleep, reducing nightmares, improving mood, and reducing stress/anxieety for 5 out of 7 days for 60 days."  Interventions:  Therapist utilized CBT and Solution focused brief therapy to address mood and anxiety. Therapist explored patient's anxiety related to being homeless. Therapist provided psychoeducation on sleep and reinforced with patient steps to sleep including (dark room, minimal distractions, etc).   Effectiveness: Patient was oriented x5 (person, place, situation, time, and object). Patient was tired, and overwhelmed. He reported that things were the same. He has been trying to sleep and has not been doing well with it. Patient understood steps to sleep and planned to use them to rest.   Patient engaged in session. He responded well to interventions. Patient continues to meet criteria for PTSD. Patient will continue in outpatient therapy due to being the least restrictive service to meet his needs at this time. Patient made minimal progress on his goals at this time.   Suicidal/Homicidal: Negativewithout intent/plan  Plan: Return again in 2-4 weeks.    Diagnosis: Axis I: Post Traumatic Stress  Disorder    Axis II: No diagnosis  I discussed the assessment and treatment plan with the patient. The patient was provided an opportunity to ask questions and all were answered. The patient agreed with the plan and demonstrated an understanding of the instructions.   The patient was advised to call back or seek an in-person evaluation if the symptoms worsen or if the condition fails to improve as anticipated.  I provided 16 minutes of non-face-to-face time during this encounter.  Bynum Bellows, LCSW 11/24/2020

## 2020-12-06 ENCOUNTER — Ambulatory Visit (INDEPENDENT_AMBULATORY_CARE_PROVIDER_SITE_OTHER): Payer: Self-pay | Admitting: Licensed Clinical Social Worker

## 2020-12-06 DIAGNOSIS — F4310 Post-traumatic stress disorder, unspecified: Secondary | ICD-10-CM
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
## 2020-12-20 ENCOUNTER — Ambulatory Visit (HOSPITAL_COMMUNITY): Payer: Self-pay | Admitting: Licensed Clinical Social Worker

## 2021-02-01 ENCOUNTER — Ambulatory Visit (INDEPENDENT_AMBULATORY_CARE_PROVIDER_SITE_OTHER): Payer: Self-pay | Admitting: Licensed Clinical Social Worker

## 2021-02-01 DIAGNOSIS — F4310 Post-traumatic stress disorder, unspecified: Secondary | ICD-10-CM

## 2021-02-03 NOTE — Progress Notes (Signed)
Virtual Visit via Telephone Note  I connected with Vincent Davis on 02/03/21 at  1:00 PM EDT by telephone and verified that I am speaking with the correct person using two identifiers.  Location: Patient: Home  Provider: Office    I discussed the limitations, risks, security and privacy concerns of performing an evaluation and management service by telephone and the availability of in person appointments. I also discussed with the patient that there may be a patient responsible charge related to this service. The patient expressed understanding and agreed to proceed.   THERAPIST PROGRESS NOTE  Session Time: 1:00 pm-1:30 pm  Type of Therapy: Individual Therapy  Session#17  Purpose of Session/Treatment Goals addressed: "Dragon will manage mood as evidenced by improving sleep, reducing nightmares, improving mood, and reducing stress/anxieety for 5 out of 7 days for 60 days."  Interventions:  Therapist utilized CBT and Solution focused brief therapy to address PTSD. Therapist provided support and space for patient to be open with his thoughts and feelings. Therapist processed patient being denied for disability and the emotions associated with the denial.    Effectiveness: Patient was oriented x5 (person, place, situation, time, and object). Patient was engaged, alert, and depressed. Patient continues to struggle with sleep and nightmares. He is feeling hopeless due to his physical health and his difficulty with trying to find work he can currently do. Patient continues to have no housing. He is spending time between two friends homes. He doesn't eat much and doesn't shower often due to not wanting to be a financial burden to the people he is staying with. He was turned down for disability again and is going to appeal. Patient feels stuck right now.   Patient engaged in session. He responded well to interventions. Patient continues to meet criteria for PTSD. Patient will continue in  outpatient therapy due to being the least restrictive service to meet his needs at this time. Patient made minimal progress on his goals at this time.   Suicidal/Homicidal: Negativewithout intent/plan  Plan: Return again in 2-4 weeks.    Diagnosis: Axis I: Post Traumatic Stress Disorder    Axis II: No diagnosis  I discussed the assessment and treatment plan with the patient. The patient was provided an opportunity to ask questions and all were answered. The patient agreed with the plan and demonstrated an understanding of the instructions.   The patient was advised to call back or seek an in-person evaluation if the symptoms worsen or if the condition fails to improve as anticipated.  I provided 30 minutes of non-face-to-face time during this encounter.  Bynum Bellows, LCSW 02/03/2021

## 2021-02-07 ENCOUNTER — Ambulatory Visit: Payer: Self-pay | Admitting: Nurse Practitioner

## 2021-02-15 ENCOUNTER — Ambulatory Visit (INDEPENDENT_AMBULATORY_CARE_PROVIDER_SITE_OTHER): Payer: Self-pay | Admitting: Licensed Clinical Social Worker

## 2021-02-15 DIAGNOSIS — F4310 Post-traumatic stress disorder, unspecified: Secondary | ICD-10-CM

## 2021-02-16 NOTE — Progress Notes (Signed)
Virtual Visit via Telephone Note  I connected with Vincent Davis on 02/16/21 at 11:00 AM EDT by telephone and verified that I am speaking with the correct person using two identifiers.  Location: Patient: Home  Provider: Office    I discussed the limitations, risks, security and privacy concerns of performing an evaluation and management service by telephone and the availability of in person appointments. I also discussed with the patient that there may be a patient responsible charge related to this service. The patient expressed understanding and agreed to proceed.   THERAPIST PROGRESS NOTE  Session Time: 11:00 am-11:30 am  Type of Therapy: Individual Therapy  Session#18  Purpose of Session/Treatment Goals addressed: "Delfin will manage mood as evidenced by improving sleep, reducing nightmares, improving mood, and reducing stress/anxieety for 5 out of 7 days for 60 days."  Interventions:  Therapist utilized CBT and Solution focused brief therapy to address PTSD. Therapist provided support and empathy to patient. Therapist processed patient relationships and his view of healthy relationships.   Effectiveness: Patient was oriented x5 (person, place, situation, time, and object). Patient was alert, engaged, pleasant, and cooperative during session. Patient noted that for the most part things are the same. He continues to split his time between homes. He is frustrated with disability but is going to continue to work the process. Patient continues to struggle with pain and difficulty with sleep. Patient has met someone that is being kind to him and is interested in being a support to him. He is taking things slow and getting to know the person. He is worried that he doesn't know what a healthy relationship is any more. He is getting to know this person and make sure that their values line up.   Patient engaged in session. He responded well to interventions. Patient continues to meet  criteria for PTSD. Patient will continue in outpatient therapy due to being the least restrictive service to meet his needs at this time. Patient made minimal progress on his goals at this time.   Suicidal/Homicidal: Negativewithout intent/plan  Plan: Return again in 2-4 weeks.    Diagnosis: Axis I: Post Traumatic Stress Disorder    Axis II: No diagnosis  I discussed the assessment and treatment plan with the patient. The patient was provided an opportunity to ask questions and all were answered. The patient agreed with the plan and demonstrated an understanding of the instructions.   The patient was advised to call back or seek an in-person evaluation if the symptoms worsen or if the condition fails to improve as anticipated.  I provided 30 minutes of non-face-to-face time during this encounter.  Glori Bickers, LCSW 02/16/2021

## 2021-03-01 ENCOUNTER — Ambulatory Visit (HOSPITAL_COMMUNITY): Payer: Self-pay | Admitting: Licensed Clinical Social Worker

## 2021-03-02 ENCOUNTER — Ambulatory Visit: Payer: Self-pay | Admitting: Nurse Practitioner

## 2021-03-02 ENCOUNTER — Ambulatory Visit: Payer: Self-pay | Admitting: Family Medicine

## 2021-03-16 ENCOUNTER — Ambulatory Visit (INDEPENDENT_AMBULATORY_CARE_PROVIDER_SITE_OTHER): Payer: Self-pay | Admitting: Licensed Clinical Social Worker

## 2021-03-16 DIAGNOSIS — F4310 Post-traumatic stress disorder, unspecified: Secondary | ICD-10-CM

## 2021-03-16 NOTE — Progress Notes (Signed)
Virtual Visit via Telephone Note  I connected with Vincent Davis on 03/16/21 at  8:00 AM EDT by telephone and verified that I am speaking with the correct person using two identifiers.  Location: Patient: Home  Provider: Office    I discussed the limitations, risks, security and privacy concerns of performing an evaluation and management service by telephone and the availability of in person appointments. I also discussed with the patient that there may be a patient responsible charge related to this service. The patient expressed understanding and agreed to proceed.   THERAPIST PROGRESS NOTE  Session Time: 8:00 am-8:30 am  Type of Therapy: Individual Therapy  Session#19  Purpose of Session/Treatment Goals addressed: "Vincent Davis will manage mood as evidenced by improving sleep, reducing nightmares, improving mood, and reducing stress/anxieety for 5 out of 7 days for 60 days."  Interventions:  Therapist utilized CBT and Solution focused brief therapy to address PTSD. Therapist provided support and empathy to patient. Therapist explored patient's triggers for mood. Therapist discussed with patient intensive treatment options through [facility]. Therapist assessed for safety.   Effectiveness: Patient was oriented x5 (person, place, situation, time, and object). Patient was alert, engaged, cooperative, and overwhelmed. Patient's mood and circumstances are the same. He had thoughts of checking himself into the hospital. He feels the pressure of increase in gas prices and prices of everything recently. He feels stuck. Patient denies suicidal thoughts but feels like he needs more intensive treatment. Patient doesn't have money or transportation to get him to treatment. Patient is also struggling to afford his medication. Patient is being connected to additional resources to pay for medication and more intensive treatment.   Patient engaged in session. He responded well to interventions. Patient  continues to meet criteria for PTSD. Patient will continue in outpatient therapy due to being the least restrictive service to meet his needs at this time. Patient made minimal progress on his goals at this time.   Suicidal/Homicidal: Negativewithout intent/plan  Plan: Return again in 2-4 weeks.    Diagnosis: Axis I: Post Traumatic Stress Disorder    Axis II: No diagnosis  I discussed the assessment and treatment plan with the patient. The patient was provided an opportunity to ask questions and all were answered. The patient agreed with the plan and demonstrated an understanding of the instructions.   The patient was advised to call back or seek an in-person evaluation if the symptoms worsen or if the condition fails to improve as anticipated.  I provided 30 minutes of non-face-to-face time during this encounter.  Bynum Bellows, LCSW 03/16/2021

## 2021-03-30 ENCOUNTER — Ambulatory Visit (INDEPENDENT_AMBULATORY_CARE_PROVIDER_SITE_OTHER): Payer: Self-pay | Admitting: Licensed Clinical Social Worker

## 2021-03-30 DIAGNOSIS — F4310 Post-traumatic stress disorder, unspecified: Secondary | ICD-10-CM

## 2021-03-30 NOTE — Progress Notes (Signed)
Virtual Visit via Telephone Note  I connected with Vincent Davis on 03/30/21 at  8:00 AM EDT by telephone and verified that I am speaking with the correct person using two identifiers.  Location: Patient: Home  Provider: Office    I discussed the limitations, risks, security and privacy concerns of performing an evaluation and management service by telephone and the availability of in person appointments. I also discussed with the patient that there may be a patient responsible charge related to this service. The patient expressed understanding and agreed to proceed.   THERAPIST PROGRESS NOTE  Session Time: 8:00 am-8:15 am  Type of Therapy: Individual Therapy  Session#20  Purpose of Session/Treatment Goals addressed: "Vincent Davis will manage mood as evidenced by improving sleep, reducing nightmares, improving mood, and reducing stress/anxieety for 5 out of 7 days for 60 days."  Interventions:  Therapist utilized CBT and Solution focused brief therapy to address PTSD. Therapist provided support and empathy to patient in session. Therapist explored patient's mood and steps he is taking to regulate it.   Effectiveness: Patient was oriented x5 (person, place, situation, time, and object). Patient was tired, pleasant, cooperative, and engaged. Patient was tired. He situation has stayed the same. 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 Patient is going to continue to try to work despite his physical limitations.   Patient engaged in session. He responded well to interventions. Patient continues to meet criteria for PTSD. Patient will continue in outpatient therapy due to being the least restrictive service to meet his needs at this time. Patient made minimal progress on his goals at this time.   Suicidal/Homicidal: Negativewithout intent/plan  Plan: Return again in 2-4 weeks.    Diagnosis: Axis I: Post Traumatic Stress Disorder    Axis II: No  diagnosis  I discussed the assessment and treatment plan with the patient. The patient was provided an opportunity to ask questions and all were answered. The patient agreed with the plan and demonstrated an understanding of the instructions.   The patient was advised to call back or seek an in-person evaluation if the symptoms worsen or if the condition fails to improve as anticipated.  I provided 15 minutes of non-face-to-face time during this encounter.  Bynum Bellows, LCSW 03/30/2021

## 2021-05-11 ENCOUNTER — Ambulatory Visit (HOSPITAL_COMMUNITY): Payer: Self-pay | Admitting: Licensed Clinical Social Worker

## 2021-06-08 ENCOUNTER — Ambulatory Visit (HOSPITAL_COMMUNITY): Payer: Self-pay | Admitting: Licensed Clinical Social Worker

## 2021-06-22 ENCOUNTER — Encounter (HOSPITAL_COMMUNITY): Payer: Self-pay | Admitting: Licensed Clinical Social Worker

## 2021-06-22 ENCOUNTER — Ambulatory Visit (HOSPITAL_COMMUNITY): Payer: Self-pay | Admitting: Licensed Clinical Social Worker

## 2021-06-28 ENCOUNTER — Ambulatory Visit (HOSPITAL_COMMUNITY): Payer: Self-pay | Admitting: Licensed Clinical Social Worker

## 2021-07-06 ENCOUNTER — Ambulatory Visit (HOSPITAL_COMMUNITY): Payer: Self-pay | Admitting: Licensed Clinical Social Worker
